# Patient Record
Sex: Male | Born: 1960
Health system: Southern US, Community
[De-identification: ages and names within clinical notes are randomized; demographics above are authoritative.]

## PROBLEM LIST (undated history)

## (undated) DIAGNOSIS — E119 Type 2 diabetes mellitus without complications: Secondary | ICD-10-CM

## (undated) DIAGNOSIS — E118 Type 2 diabetes mellitus with unspecified complications: Secondary | ICD-10-CM

## (undated) DIAGNOSIS — E785 Hyperlipidemia, unspecified: Secondary | ICD-10-CM

## (undated) HISTORY — DX: Hyperlipidemia, unspecified: E78.5

## (undated) HISTORY — DX: Type 2 diabetes mellitus with unspecified complications: E11.8

---

## 2007-10-10 ENCOUNTER — Emergency Department (HOSPITAL_COMMUNITY): Admission: EM | Admit: 2007-10-10 | Discharge: 2007-10-10 | Payer: Self-pay | Admitting: Emergency Medicine

## 2011-08-06 LAB — CBC
Hemoglobin: 14.2
MCHC: 34.1
Platelets: 351
RBC: 4.57
WBC: 5

## 2011-08-06 LAB — POCT CARDIAC MARKERS
CKMB, poc: 1.5
Myoglobin, poc: 86.7
Operator id: 4295
Troponin i, poc: <0.05

## 2011-08-06 LAB — DIFFERENTIAL
Eosinophils Absolute: 0.1 — ABNORMAL LOW
Eosinophils Relative: 2
Lymphs Abs: 2.4
Neutro Abs: 2.1
Neutrophils Relative %: 41 — ABNORMAL LOW

## 2011-08-06 LAB — BASIC METABOLIC PANEL
BUN: 9
Creatinine, Ser: 0.91
GFR calc Af Amer: >60
GFR calc non Af Amer: >60
Glucose, Bld: 109 — ABNORMAL HIGH

## 2014-08-23 ENCOUNTER — Emergency Department (HOSPITAL_COMMUNITY): Admission: EM | Admit: 2014-08-23 | Discharge: 2014-08-23 | Discharge status: Absent without leave | Payer: Self-pay

## 2014-10-06 ENCOUNTER — Emergency Department (HOSPITAL_COMMUNITY): Payer: BC Managed Care – PPO

## 2014-10-06 ENCOUNTER — Emergency Department (HOSPITAL_COMMUNITY)
Admission: EM | Admit: 2014-10-06 | Discharge: 2014-10-07 | Disposition: A | Discharge status: Home / Self Care | Payer: BC Managed Care – PPO | Attending: Emergency Medicine | Admitting: Emergency Medicine

## 2014-10-06 ENCOUNTER — Encounter (HOSPITAL_COMMUNITY): Payer: Self-pay

## 2014-10-06 DIAGNOSIS — Z79899 Other long term (current) drug therapy: Secondary | ICD-10-CM | POA: Insufficient documentation

## 2014-10-06 DIAGNOSIS — E119 Type 2 diabetes mellitus without complications: Secondary | ICD-10-CM | POA: Insufficient documentation

## 2014-10-06 DIAGNOSIS — R079 Chest pain, unspecified: Secondary | ICD-10-CM | POA: Insufficient documentation

## 2014-10-06 DIAGNOSIS — R0602 Shortness of breath: Secondary | ICD-10-CM | POA: Diagnosis not present

## 2014-10-06 DIAGNOSIS — R61 Generalized hyperhidrosis: Secondary | ICD-10-CM | POA: Insufficient documentation

## 2014-10-06 HISTORY — DX: Type 2 diabetes mellitus without complications: E11.9

## 2014-10-06 LAB — BASIC METABOLIC PANEL
ANION GAP: 16 — AB (ref 5–15)
BUN: 16 mg/dL (ref 6–23)
CALCIUM: 9.2 mg/dL (ref 8.4–10.5)
CHLORIDE: 95 meq/L — AB (ref 96–112)
CO2: 21 mEq/L (ref 19–32)
Creatinine, Ser: 0.98 mg/dL (ref 0.50–1.35)
GFR calc non Af Amer: >90 mL/min (ref >90)
Glucose, Bld: 335 mg/dL — ABNORMAL HIGH (ref 70–99)
Potassium: 3.9 mEq/L (ref 3.7–5.3)
Sodium: 132 mEq/L — ABNORMAL LOW (ref 137–147)

## 2014-10-06 LAB — CBC
HEMATOCRIT: 40.2 % (ref 39.0–52.0)
HEMOGLOBIN: 13.6 g/dL (ref 13.0–17.0)
MCH: 30.2 pg (ref 26.0–34.0)
MCHC: 33.8 g/dL (ref 30.0–36.0)
MCV: 89.1 fL (ref 78.0–100.0)
Platelets: 326 10*3/uL (ref 150–400)
RBC: 4.51 MIL/uL (ref 4.22–5.81)
RDW: 13.3 % (ref 11.5–15.5)
WBC: 5.1 10*3/uL (ref 4.0–10.5)

## 2014-10-06 LAB — I-STAT TROPONIN, ED: TROPONIN I, POC: 0 ng/mL (ref 0.00–0.08)

## 2014-10-06 MED ORDER — SODIUM CHLORIDE 0.9 % IV BOLUS (SEPSIS)
1000.0000 mL | Freq: Once | INTRAVENOUS | Status: AC
  Administered 2014-10-06: 1000 mL via INTRAVENOUS

## 2014-10-06 NOTE — ED Notes (Signed)
Pt presents with Left chest pain, dizziness, SOB, and nausea x2 days. Pt also reports posterior neck pain.

## 2014-10-06 NOTE — ED Provider Notes (Signed)
CSN: [id number]     Arrival date & time 10/06/14  2156 History   First MD Initiated Contact with Patient 10/06/14 2257     Chief Complaint  Patient presents with  . Chest Pain     (Consider location/radiation/quality/duration/timing/severity/associated sxs/prior Treatment) HPI Wesley Davila is a 53 y.o. male with past medical history of diabetes coming in with chest pain. Patient states it's left-sided and sharp and going on for several months. This is associated with shortness of breath diaphoresis, he denies emesis. Patient presents tonight because it occurred again at 8 PM. It lasted approximately 30 minutes and went away. At the time he was moving furniture and it resolved with rest. He currently is denying any chest pain.He denies any history of ACS or blood clots. He's had no recent fevers or infections.  Patient has no further complaints.  10 Systems reviewed and are negative for acute change except as noted in the HPI.     Past Medical History  Diagnosis Date  . Diabetes mellitus without complication    History reviewed. No pertinent past surgical history. History reviewed. No pertinent family history. History  Substance Use Topics  . Smoking status: Never Smoker   . Smokeless tobacco: Not on file  . Alcohol Use: No    Review of Systems    Allergies  Review of patient's allergies indicates no known allergies.  Home Medications   Prior to Admission medications   Medication Sig Start Date End Date Taking? Authorizing Provider  metFORMIN (GLUCOPHAGE) 500 MG tablet Take 500 mg by mouth 2 (two) times daily with a meal.   Yes Historical Provider, MD   BP 126/73 mmHg  Pulse 86  Temp(Src) 98.2 F (36.8 C)  Resp 19  SpO2 98% Physical Exam  Constitutional: He is oriented to person, place, and time. Vital signs are normal. He appears well-developed and well-nourished.  Non-toxic appearance. He does not appear ill. No distress.  HENT:  Head: Normocephalic and  atraumatic.  Nose: Nose normal.  Mouth/Throat: Oropharynx is clear and moist. No oropharyngeal exudate.  Eyes: Conjunctivae and EOM are normal. Pupils are equal, round, and reactive to light. No scleral icterus.  Neck: Normal range of motion. Neck supple. No tracheal deviation, no edema, no erythema and normal range of motion present. No thyroid mass and no thyromegaly present.  Cardiovascular: Normal rate, regular rhythm, S1 normal, S2 normal, normal heart sounds, intact distal pulses and normal pulses.  Exam reveals no gallop and no friction rub.   No murmur heard. Pulses:      Radial pulses are 2+ on the right side, and 2+ on the left side.       Dorsalis pedis pulses are 2+ on the right side, and 2+ on the left side.  Pulmonary/Chest: Effort normal and breath sounds normal. No respiratory distress. He has no wheezes. He has no rhonchi. He has no rales.  Abdominal: Soft. Normal appearance and bowel sounds are normal. He exhibits no distension, no ascites and no mass. There is no hepatosplenomegaly. There is no tenderness. There is no rebound, no guarding and no CVA tenderness.  Musculoskeletal: Normal range of motion. He exhibits no edema or tenderness.  Lymphadenopathy:    He has no cervical adenopathy.  Neurological: He is alert and oriented to person, place, and time. He has normal strength. No cranial nerve deficit or sensory deficit. He exhibits normal muscle tone. GCS eye subscore is 4. GCS verbal subscore is 5. GCS motor subscore is 6.  Skin: Skin is warm, dry and intact. No petechiae and no rash noted. He is not diaphoretic. No erythema. No pallor.  Psychiatric: He has a normal mood and affect. His behavior is normal. Judgment normal.  Nursing note and vitals reviewed.   ED Course  Procedures (including critical care time) Labs Review Labs Reviewed  BASIC METABOLIC PANEL - Abnormal; Notable for the following:    Sodium 132 (*)    Chloride 95 (*)    Glucose, Bld 335 (*)     Anion gap 16 (*)    All other components within normal limits  CBC  PRO B NATRIURETIC PEPTIDE  TROPONIN I  I-STAT TROPOININ, ED    Imaging Review Dg Chest 2 View  10/07/2014   CLINICAL DATA:  Chest pain, history diabetes  EXAM: CHEST  2 VIEW  COMPARISON:  10/10/2007  FINDINGS: Upper normal heart size.  Mediastinal contours and pulmonary vascularity normal.  Minimal RIGHT basilar atelectasis.  Lungs otherwise clear.  No pleural effusion or pneumothorax.  No acute osseous findings.  IMPRESSION: Minimal RIGHT basilar atelectasis without acute infiltrate.   Electronically Signed   By: Lavonia Dana M.D.   On: 10/07/2014 00:25     EKG Interpretation   Date/Time:  Wednesday October 06 2014 22:02:36 EST Ventricular Rate:  102 PR Interval:  148 QRS Duration: 84 QT Interval:  344 QTC Calculation: 448 R Axis:   31 Text Interpretation:  Sinus tachycardia with occasional Premature  ventricular complexes Abnormal ECG No significant change since last  tracing Confirmed by Glynn Octave [phone]) on 10/06/2014 11:23:44  PM      MDM   Final diagnoses:  Chest pain    Patient's this emergency department for concern for chest pain. He denies any dizziness or neck pain to meet despite the triage note. Fingerstick is also in the 300s, we'll provide IV fluids. There is no DKA. Will evaluate patient with heart score and 2 sets of troponin.  Second troponin is negative, heart score is 2. Patient is not currently having any chest pain. His vital signs remain within his normal limits and is safe for discharge.  Everlene Balls, MD 10/07/14 [phone]

## 2014-10-07 LAB — PRO B NATRIURETIC PEPTIDE: Pro B Natriuretic peptide (BNP): 15.5 pg/mL (ref 0–125)

## 2014-10-07 LAB — TROPONIN I

## 2014-10-07 NOTE — Discharge Instructions (Signed)
Chest Pain (Nonspecific) Mr. Wesley Davila, you were seen today for chest pain, your laboratory studies and EKG were normal. Follow-up with her primary care physician within 3 days for continued management. If your symptoms worsen come back to emergency department. Thank you. It is often hard to give a diagnosis for the cause of chest pain. There is always a chance that your pain could be related to something serious, such as a heart attack or a blood clot in the lungs. You need to follow up with your doctor. HOME CARE  If antibiotic medicine was given, take it as directed by your doctor. Finish the medicine even if you start to feel better.  For the next few days, avoid activities that bring on chest pain. Continue physical activities as told by your doctor.  Do not use any tobacco products. This includes cigarettes, chewing tobacco, and e-cigarettes.  Avoid drinking alcohol.  Only take medicine as told by your doctor.  Follow your doctor's suggestions for more testing if your chest pain does not go away.  Keep all doctor visits you made. GET HELP IF:  Your chest pain does not go away, even after treatment.  You have a rash with blisters on your chest.  You have a fever. GET HELP RIGHT AWAY IF:   You have more pain or pain that spreads to your arm, neck, jaw, back, or belly (abdomen).  You have shortness of breath.  You cough more than usual or cough up blood.  You have very bad back or belly pain.  You feel sick to your stomach (nauseous) or throw up (vomit).  You have very bad weakness.  You pass out (faint).  You have chills. This is an emergency. Do not wait to see if the problems will go away. Call your local emergency services (911 in [place] Do not drive yourself to the hospital. MAKE SURE YOU:   Understand these instructions.  Will watch your condition.  Will get help right away if you are not doing well or get worse. Document Released: 04/02/2008 Document  Revised: 10/20/2013 Document Reviewed: 04/02/2008 [facility] Patient Information 2015 [place], [place]. This information is not intended to replace advice given to you by your health care provider. Make sure you discuss any questions you have with your health care provider.

## 2015-01-11 ENCOUNTER — Emergency Department (HOSPITAL_COMMUNITY)
Admission: EM | Admit: 2015-01-11 | Discharge: 2015-01-12 | Disposition: A | Discharge status: Home / Self Care | Payer: BLUE CROSS/BLUE SHIELD | Attending: Emergency Medicine | Admitting: Emergency Medicine

## 2015-01-11 ENCOUNTER — Encounter (HOSPITAL_COMMUNITY): Payer: Self-pay | Admitting: Emergency Medicine

## 2015-01-11 DIAGNOSIS — R079 Chest pain, unspecified: Secondary | ICD-10-CM | POA: Insufficient documentation

## 2015-01-11 DIAGNOSIS — E119 Type 2 diabetes mellitus without complications: Secondary | ICD-10-CM | POA: Diagnosis not present

## 2015-01-11 DIAGNOSIS — Z79899 Other long term (current) drug therapy: Secondary | ICD-10-CM | POA: Diagnosis not present

## 2015-01-11 DIAGNOSIS — R0602 Shortness of breath: Secondary | ICD-10-CM | POA: Insufficient documentation

## 2015-01-11 DIAGNOSIS — I10 Essential (primary) hypertension: Secondary | ICD-10-CM | POA: Insufficient documentation

## 2015-01-11 LAB — BASIC METABOLIC PANEL
ANION GAP: 7 (ref 5–15)
BUN: 14 mg/dL (ref 6–23)
CHLORIDE: 101 mmol/L (ref 96–112)
CO2: 27 mmol/L (ref 19–32)
CREATININE: 0.9 mg/dL (ref 0.50–1.35)
Calcium: 8.9 mg/dL (ref 8.4–10.5)
GFR calc non Af Amer: >90 mL/min (ref >90)
Glucose, Bld: 322 mg/dL — ABNORMAL HIGH (ref 70–99)
POTASSIUM: 3.9 mmol/L (ref 3.5–5.1)
SODIUM: 135 mmol/L (ref 135–145)

## 2015-01-11 LAB — CBC
HCT: 41.4 % (ref 39.0–52.0)
HEMOGLOBIN: 13.7 g/dL (ref 13.0–17.0)
MCH: 29.9 pg (ref 26.0–34.0)
MCHC: 33.1 g/dL (ref 30.0–36.0)
MCV: 90.4 fL (ref 78.0–100.0)
Platelets: 332 10*3/uL (ref 150–400)
RBC: 4.58 MIL/uL (ref 4.22–5.81)
RDW: 13.1 % (ref 11.5–15.5)
WBC: 5.7 10*3/uL (ref 4.0–10.5)

## 2015-01-11 LAB — I-STAT TROPONIN, ED: TROPONIN I, POC: 0 ng/mL (ref 0.00–0.08)

## 2015-01-11 NOTE — ED Notes (Signed)
Pt states has left sided chest pain started at 5 pm, has had neck and head pain for 3 days with tightness in neck-- ws sleeping this afternoon and woke up with chest pain.

## 2015-01-12 ENCOUNTER — Emergency Department (HOSPITAL_COMMUNITY): Payer: BLUE CROSS/BLUE SHIELD

## 2015-01-12 LAB — I-STAT TROPONIN, ED: Troponin i, poc: 0 ng/mL (ref 0.00–0.08)

## 2015-01-12 MED ORDER — IBUPROFEN 800 MG PO TABS
800.0000 mg | ORAL_TABLET | Freq: Once | ORAL | Status: AC
  Administered 2015-01-12: 800 mg via ORAL
  Filled 2015-01-12: qty 1

## 2015-01-12 MED ORDER — NITROGLYCERIN 0.4 MG SL SUBL
0.4000 mg | SUBLINGUAL_TABLET | SUBLINGUAL | Status: DC | PRN
  Administered 2015-01-12: 0.4 mg via SUBLINGUAL
  Filled 2015-01-12: qty 1

## 2015-01-12 NOTE — ED Provider Notes (Signed)
CSN: [id number]     Arrival date & time 01/11/15  2133 History  This chart was scribed for Everlene Balls, MD by Delphia Grates, ED Scribe. This patient was seen in room D35C/D35C and the patient's care was started at 12:04 AM.    Chief Complaint  Patient presents with  . Chest Pain    The history is provided by the patient. No language interpreter was used.     HPI Comments: Wesley Davila is a 54 y.o. male, with history of DM and HTN, who presents to the Emergency Department complaining of intermittent left sided chest pain described as pressure that began approximately 7 hours ago. Patient states he was at home, resting when the pain started and denies any physical exertion prior to onset. There is associated SOB. He reports history of the same 3-4 years ago. He also complains of generalized head and neck/throat pain for the past 3 days. Patient notes the pain is worse with any movement of the head. He suspects the pain is related to way he was sleeping or the pillows he was using, however, he reports the pain still persist after correcting the issue. He denies recent surgery or travel. He further denies diaphoresis, nausea, vomiting. Patient is not established with a cardiologist.  PCP: Barbette Merino, MD   Past Medical History  Diagnosis Date  . Diabetes mellitus without complication    History reviewed. No pertinent past surgical history. No family history on file. History  Substance Use Topics  . Smoking status: Never Smoker   . Smokeless tobacco: Not on file  . Alcohol Use: No    Review of Systems  A complete 10 system review of systems was obtained and all systems are negative except as noted in the HPI and PMH.    Allergies  Review of patient's allergies indicates no known allergies.  Home Medications   Prior to Admission medications   Medication Sig Start Date End Date Taking? Authorizing Provider  ibuprofen (ADVIL,MOTRIN) 200 MG tablet Take 200 mg by mouth every 6  (six) hours as needed for mild pain.   Yes Historical Provider, MD  metFORMIN (GLUCOPHAGE) 500 MG tablet Take 500 mg by mouth 2 (two) times daily with a meal.   Yes Historical Provider, MD   BP 133/88 mmHg  Pulse 86  Temp(Src) 98.3 F (36.8 C) (Oral)  Resp 20  Ht 5\' 9"  (1.753 m)  Wt 195 lb (88.451 kg)  BMI 28.78 kg/m2  SpO2 100%  Physical Exam  Constitutional: He is oriented to person, place, and time. Vital signs are normal. He appears well-developed and well-nourished.  Non-toxic appearance. He does not appear ill. No distress.  HENT:  Head: Normocephalic and atraumatic.  Nose: Nose normal.  Mouth/Throat: Oropharynx is clear and moist. No oropharyngeal exudate.  Eyes: Conjunctivae and EOM are normal. Pupils are equal, round, and reactive to light. No scleral icterus.  Neck: Normal range of motion. Neck supple. No tracheal deviation, no edema, no erythema and normal range of motion present. No thyroid mass and no thyromegaly present.  Cardiovascular: Normal rate, regular rhythm, S1 normal, S2 normal, normal heart sounds, intact distal pulses and normal pulses.  Exam reveals no gallop and no friction rub.   No murmur heard. Pulses:      Radial pulses are 2+ on the right side, and 2+ on the left side.       Dorsalis pedis pulses are 2+ on the right side, and 2+ on the left side.  Pulmonary/Chest:  Effort normal and breath sounds normal. No respiratory distress. He has no wheezes. He has no rhonchi. He has no rales.  Abdominal: Soft. Normal appearance and bowel sounds are normal. He exhibits no distension, no ascites and no mass. There is no hepatosplenomegaly. There is no tenderness. There is no rebound, no guarding and no CVA tenderness.  Musculoskeletal: Normal range of motion. He exhibits no edema or tenderness.  Lymphadenopathy:    He has no cervical adenopathy.  Neurological: He is alert and oriented to person, place, and time. He has normal strength. No cranial nerve deficit or  sensory deficit.  Skin: Skin is warm, dry and intact. No petechiae and no rash noted. He is not diaphoretic. No erythema. No pallor.  Psychiatric: He has a normal mood and affect. His behavior is normal. Judgment normal.  Nursing note and vitals reviewed.   ED Course  Procedures (including critical care time)  DIAGNOSTIC STUDIES: Oxygen Saturation is 100% on room air, normal by my interpretation.    COORDINATION OF CARE: At 0013 Discussed treatment plan with patient which includes labs. Patient agrees.   Labs Review Labs Reviewed  BASIC METABOLIC PANEL - Abnormal; Notable for the following:    Glucose, Bld 322 (*)    All other components within normal limits  CBC  I-STAT TROPOININ, ED  Randolm Idol, ED    Imaging Review Dg Chest 2 View  01/12/2015   CLINICAL DATA:  Left-sided chest pain and dyspnea  EXAM: CHEST  2 VIEW  COMPARISON:  10/06/2014  FINDINGS: There is mild crowding of the basilar markings due to shallow inspiration. The lungs are clear. There is no effusion. Hilar, mediastinal and cardiac contours are unremarkable and unchanged.  IMPRESSION: No active cardiopulmonary disease.   Electronically Signed   By: Andreas Newport M.D.   On: 01/12/2015 01:35     EKG Interpretation   Date/Time:  Tuesday January 11 2015 21:41:01 EDT Ventricular Rate:  95 PR Interval:  150 QRS Duration: 84 QT Interval:  360 QTC Calculation: 452 R Axis:   22 Text Interpretation:  Normal sinus rhythm Nonspecific T wave abnormality  Abnormal ECG No significant change since last tracing Confirmed by Glynn Octave [phone]) on 01/12/2015 12:02:19 AM      MDM   Final diagnoses:  None   patient presents to the emergency department for chest pain. He is low risk for ACS due to risk factors and his history. Will evaluate with 2 sets of troponins and the heart score in the emergency department. Heart score is 3, troponins negative 2. His repeat EKG is unchanged and does not show any  signs of ischemia. At this time the patient is stable, his vital signs were within his normal limits, he is safe for discharge with PCP fu within 3 days.    I personally performed the services described in this documentation, which was scribed in my presence. The recorded information has been reviewed and is accurate.   Everlene Balls, MD 01/12/15 [phone]

## 2015-01-12 NOTE — Discharge Instructions (Signed)
Chest Pain (Nonspecific) Wesley Davila, follow up with your primary physician within 3 days for continued evaluation of your chest pain.  If symptoms worsen, come back to the ED immediately. Thank you It is often hard to give a diagnosis for the cause of chest pain. There is always a chance that your pain could be related to something serious, such as a heart attack or a blood clot in the lungs. You need to follow up with your doctor. HOME CARE  If antibiotic medicine was given, take it as directed by your doctor. Finish the medicine even if you start to feel better.  For the next few days, avoid activities that bring on chest pain. Continue physical activities as told by your doctor.  Do not use any tobacco products. This includes cigarettes, chewing tobacco, and e-cigarettes.  Avoid drinking alcohol.  Only take medicine as told by your doctor.  Follow your doctor's suggestions for more testing if your chest pain does not go away.  Keep all doctor visits you made. GET HELP IF:  Your chest pain does not go away, even after treatment.  You have a rash with blisters on your chest.  You have a fever. GET HELP RIGHT AWAY IF:   You have more pain or pain that spreads to your arm, neck, jaw, back, or belly (abdomen).  You have shortness of breath.  You cough more than usual or cough up blood.  You have very bad back or belly pain.  You feel sick to your stomach (nauseous) or throw up (vomit).  You have very bad weakness.  You pass out (faint).  You have chills. This is an emergency. Do not wait to see if the problems will go away. Call your local emergency services (911 in [place] Do not drive yourself to the hospital. MAKE SURE YOU:   Understand these instructions.  Will watch your condition.  Will get help right away if you are not doing well or get worse. Document Released: 04/02/2008 Document Revised: 10/20/2013 Document Reviewed: 04/02/2008 [facility] Patient  Information 2015 [place], [place]. This information is not intended to replace advice given to you by your health care provider. Make sure you discuss any questions you have with your health care provider.

## 2015-10-11 ENCOUNTER — Encounter: Payer: Self-pay | Admitting: Physician Assistant

## 2015-10-11 ENCOUNTER — Ambulatory Visit (INDEPENDENT_AMBULATORY_CARE_PROVIDER_SITE_OTHER): Payer: BLUE CROSS/BLUE SHIELD | Admitting: Physician Assistant

## 2015-10-11 VITALS — BP 129/84 | HR 93 | Temp 98.2°F | Resp 16 | Ht 68.5 in | Wt 194.0 lb

## 2015-10-11 DIAGNOSIS — N529 Male erectile dysfunction, unspecified: Secondary | ICD-10-CM | POA: Diagnosis not present

## 2015-10-11 DIAGNOSIS — Z23 Encounter for immunization: Secondary | ICD-10-CM

## 2015-10-11 DIAGNOSIS — Z125 Encounter for screening for malignant neoplasm of prostate: Secondary | ICD-10-CM

## 2015-10-11 DIAGNOSIS — E785 Hyperlipidemia, unspecified: Secondary | ICD-10-CM | POA: Diagnosis not present

## 2015-10-11 DIAGNOSIS — R7309 Other abnormal glucose: Secondary | ICD-10-CM | POA: Diagnosis not present

## 2015-10-11 DIAGNOSIS — Z0000 Encounter for general adult medical examination without abnormal findings: Secondary | ICD-10-CM

## 2015-10-11 DIAGNOSIS — Z8349 Family history of other endocrine, nutritional and metabolic diseases: Secondary | ICD-10-CM

## 2015-10-11 DIAGNOSIS — Z1211 Encounter for screening for malignant neoplasm of colon: Secondary | ICD-10-CM

## 2015-10-11 LAB — COMPREHENSIVE METABOLIC PANEL
ALBUMIN: 3.9 g/dL (ref 3.6–5.1)
ALT: 16 U/L (ref 9–46)
AST: 14 U/L (ref 10–35)
Alkaline Phosphatase: 48 U/L (ref 40–115)
BILIRUBIN TOTAL: 0.3 mg/dL (ref 0.2–1.2)
BUN: 14 mg/dL (ref 7–25)
CO2: 24 mmol/L (ref 20–31)
CREATININE: 0.86 mg/dL (ref 0.70–1.25)
Calcium: 9 mg/dL (ref 8.6–10.3)
Chloride: 98 mmol/L (ref 98–110)
Glucose, Bld: 322 mg/dL — ABNORMAL HIGH (ref 65–99)
Potassium: 4.3 mmol/L (ref 3.5–5.3)
SODIUM: 134 mmol/L — AB (ref 135–146)
TOTAL PROTEIN: 6.7 g/dL (ref 6.1–8.1)

## 2015-10-11 LAB — CBC
HCT: 42.8 % (ref 39.0–52.0)
HEMOGLOBIN: 14.1 g/dL (ref 13.0–17.0)
MCH: 29.5 pg (ref 26.0–34.0)
MCHC: 32.9 g/dL (ref 30.0–36.0)
MCV: 89.5 fL (ref 78.0–100.0)
MPV: 10.2 fL (ref 8.6–12.4)
Platelets: 344 10*3/uL (ref 150–400)
RBC: 4.78 MIL/uL (ref 4.22–5.81)
RDW: 13.5 % (ref 11.5–15.5)
WBC: 4.4 10*3/uL (ref 4.0–10.5)

## 2015-10-11 LAB — LIPID PANEL
Cholesterol: 267 mg/dL — ABNORMAL HIGH (ref 125–200)
HDL: 45 mg/dL (ref >=40)
LDL CALC: 160 mg/dL — AB (ref <130)
TRIGLYCERIDES: 311 mg/dL — AB (ref <150)
Total CHOL/HDL Ratio: 5.9 Ratio — ABNORMAL HIGH (ref <=5.0)
VLDL: 62 mg/dL — ABNORMAL HIGH (ref <30)

## 2015-10-11 LAB — TSH: TSH: 2.011 u[IU]/mL (ref 0.350–4.500)

## 2015-10-11 MED ORDER — SILDENAFIL CITRATE 100 MG PO TABS: 50.0000 mg | ORAL_TABLET | Freq: Every day | ORAL | Status: DC | PRN

## 2015-10-11 NOTE — Progress Notes (Signed)
[facility] [address], [place] [address] [phone]  Date:  10/11/2015   Name:  Wesley Davila   DOB:  08/01/1951   MRN:  [id number]  PCP:  No primary care provider on file.    Chief Complaint: Annual Exam   History of Present Illness:  This is a 54 y.o. male who is presenting for CPE. He had last CPE 1 year ago at another facility. He was told at that time that he had high cholesterol and high sugar. He states he was not told he had diabetes. He was started on something for cholesterol but states he stopped after a few months "for no reason".  Immunizations: declines flu shot, needs tdap, never pneumonia or prevnar Dentist: regular visits Eye: no vision change. Doesn't wear glasses or contacts. Diet: eats healthy and drinks a lot of water Exercise: walks 1/2 mile every night Fam hx: sister breast cancer age 58, mother with thyroid cancer age 49. No hx colon cancer. Dad with MI and CVA age 43. He is unsure if father had prostate cancer but does know he had prostate surgery. No hx DM. Sexual hx: with wife, problems with ED. No cp or sob with exercise. Urinary hesitancy/frequency or nocturia: gets up 1x at night. Never had prostate exam Tobacco/alcohol/substance use: no/no/no Colonoscopy: never Works as Gaffer.  Lives with wife and kids age 17, 45 and 27  Review of Systems:  Review of Systems See CMA note  There are no active problems to display for this patient.   Prior to Admission medications   Not on File    No Known Allergies  History reviewed. No pertinent past surgical history.  Social History  Substance Use Topics  . Smoking status: Never Smoker   . Smokeless tobacco: None  . Alcohol Use: No    Family History  Problem Relation Age of Onset  . Heart disease Father     Medication list has been reviewed and updated.  Physical Examination:  Physical Exam  Constitutional: He is oriented to person, place, and time.  He appears well-developed and well-nourished.  HENT:  Head: Normocephalic and atraumatic.  Right Ear: Hearing, tympanic membrane, external ear and ear canal normal.  Left Ear: Hearing, tympanic membrane, external ear and ear canal normal.  Nose: Nose normal.  Mouth/Throat: Uvula is midline, oropharynx is clear and moist and mucous membranes are normal.  Eyes: Conjunctivae, EOM and lids are normal. Right eye exhibits no discharge. Left eye exhibits no discharge. No scleral icterus.  Neck: Trachea normal and normal range of motion. Neck supple. Carotid bruit is not present. No thyromegaly present.  Cardiovascular: Normal rate, regular rhythm, normal heart sounds, intact distal pulses and normal pulses.   No murmur heard. Pulmonary/Chest: Effort normal and breath sounds normal. No respiratory distress. He has no wheezes. He has no rhonchi. He has no rales.  Abdominal: Soft. Normal appearance and bowel sounds are normal. He exhibits no abdominal bruit. There is no tenderness.  Genitourinary: Rectum normal and prostate normal.  Musculoskeletal: Normal range of motion. He exhibits no edema or tenderness.  Lymphadenopathy:       Head (right side): No submental, no submandibular, no tonsillar, no preauricular, no posterior auricular and no occipital adenopathy present.       Head (left side): No submental, no submandibular, no tonsillar, no preauricular, no posterior auricular and no occipital adenopathy present.    He has no cervical adenopathy.  Neurological: He is alert  and oriented to person, place, and time. No cranial nerve deficit. Coordination and gait normal.  Skin: Skin is warm, dry and intact. No lesion and no rash noted.  Psychiatric: He has a normal mood and affect. His speech is normal and behavior is normal. Judgment and thought content normal.   BP 129/84 mmHg  Pulse 93  Temp(Src) 98.2 F (36.8 C)  Resp 16  Ht 5' 8.5" (1.74 m)  Wt 194 lb (87.998 kg)  BMI 29.07  kg/m2  Assessment and Plan:  1. Annual physical exam Up to date on preventative medicine.  Referred for colonoscopy. Labs below pending - CBC  2. Hyperlipidemia Will determine if statin still needed. - Lipid panel  3. Elevated glucose - Comprehensive metabolic panel  4. Erectile dysfunction, unspecified erectile dysfunction type Pt with ED. Will try viagra 50-100 mg as needed. - sildenafil (VIAGRA) 100 MG tablet; Take 0.5-1 tablets (50-100 mg total) by mouth daily as needed for erectile dysfunction.  Dispense: 5 tablet; Refill: 11  5. Family history of thyroid disease - TSH  6. Need for Tdap vaccination - Tdap vaccine greater than or equal to 7yo IM  7. Need for vaccination with 13-polyvalent pneumococcal conjugate vaccine - Pneumococcal conjugate vaccine 13-valent IM  8. Prostate cancer screening DRE normal. - PSA  9. Colon cancer screening - Ambulatory referral to Gastroenterology   Benjaman Pott. Drenda Freeze, MHS [facility]  10/11/2015

## 2015-10-11 NOTE — Patient Instructions (Signed)
Take viagra 50 mg 30 minutes before sexual intercourse. May increase to 100 mg if needed. I will call you with your lab results. You will get a phone call to make appointment with GI for colonoscopy. Return with further problems.

## 2015-10-11 NOTE — Progress Notes (Signed)
   Subjective:    Patient ID: Wesley Davila, male    DOB: 08/01/1951, 54 y.o.   MRN: [id number]  HPI    Review of Systems  Constitutional: Negative.   HENT: Negative.   Eyes: Negative.   Respiratory: Negative.   Cardiovascular: Negative.   Gastrointestinal: Negative.   Endocrine: Negative.   Genitourinary: Negative.   Musculoskeletal: Positive for arthralgias. Negative for myalgias, back pain, joint swelling, gait problem, neck pain and neck stiffness.  Skin: Negative.   Allergic/Immunologic: Negative.   Neurological: Negative.   Hematological: Negative.   Psychiatric/Behavioral: Negative.        Objective:   Physical Exam        Assessment & Plan:

## 2015-10-12 ENCOUNTER — Encounter (HOSPITAL_COMMUNITY): Payer: Self-pay | Admitting: Emergency Medicine

## 2015-10-12 LAB — PSA: PSA: 0.46 ng/mL (ref <=4.00)

## 2015-10-13 ENCOUNTER — Encounter: Payer: Self-pay | Admitting: Gastroenterology

## 2015-11-04 ENCOUNTER — Telehealth: Payer: Self-pay | Admitting: Physician Assistant

## 2015-11-04 DIAGNOSIS — E785 Hyperlipidemia, unspecified: Secondary | ICD-10-CM

## 2015-11-04 DIAGNOSIS — E119 Type 2 diabetes mellitus without complications: Secondary | ICD-10-CM

## 2015-11-04 MED ORDER — ATORVASTATIN CALCIUM 20 MG PO TABS: 20.0000 mg | ORAL_TABLET | Freq: Every day | ORAL | Status: DC

## 2015-11-04 MED ORDER — GLUCOSE BLOOD VI STRP: ORAL_STRIP | Status: DC

## 2015-11-04 MED ORDER — METFORMIN HCL 500 MG PO TABS: 500.0000 mg | ORAL_TABLET | Freq: Two times a day (BID) | ORAL | Status: DC

## 2015-11-04 NOTE — Telephone Encounter (Signed)
Started pt on lipitor 20 mg and metformin 500 mg BID. Sent glucometer and test strips. Return in 2 months for follow up. He states he plans to come next week for his shoulder pain. Will get baseline A1C at that time.

## 2015-11-30 ENCOUNTER — Encounter: Payer: Self-pay | Admitting: Gastroenterology

## 2015-12-12 ENCOUNTER — Encounter: Payer: BLUE CROSS/BLUE SHIELD | Admitting: Gastroenterology

## 2015-12-13 IMAGING — CR DG CHEST 2V
2 series · 2 of 2 positions shown · non-contrast
Comparison: 10/10/2007

CLINICAL DATA: Chest pain, history diabetes

EXAM:
CHEST  2 VIEW

[chest pa]
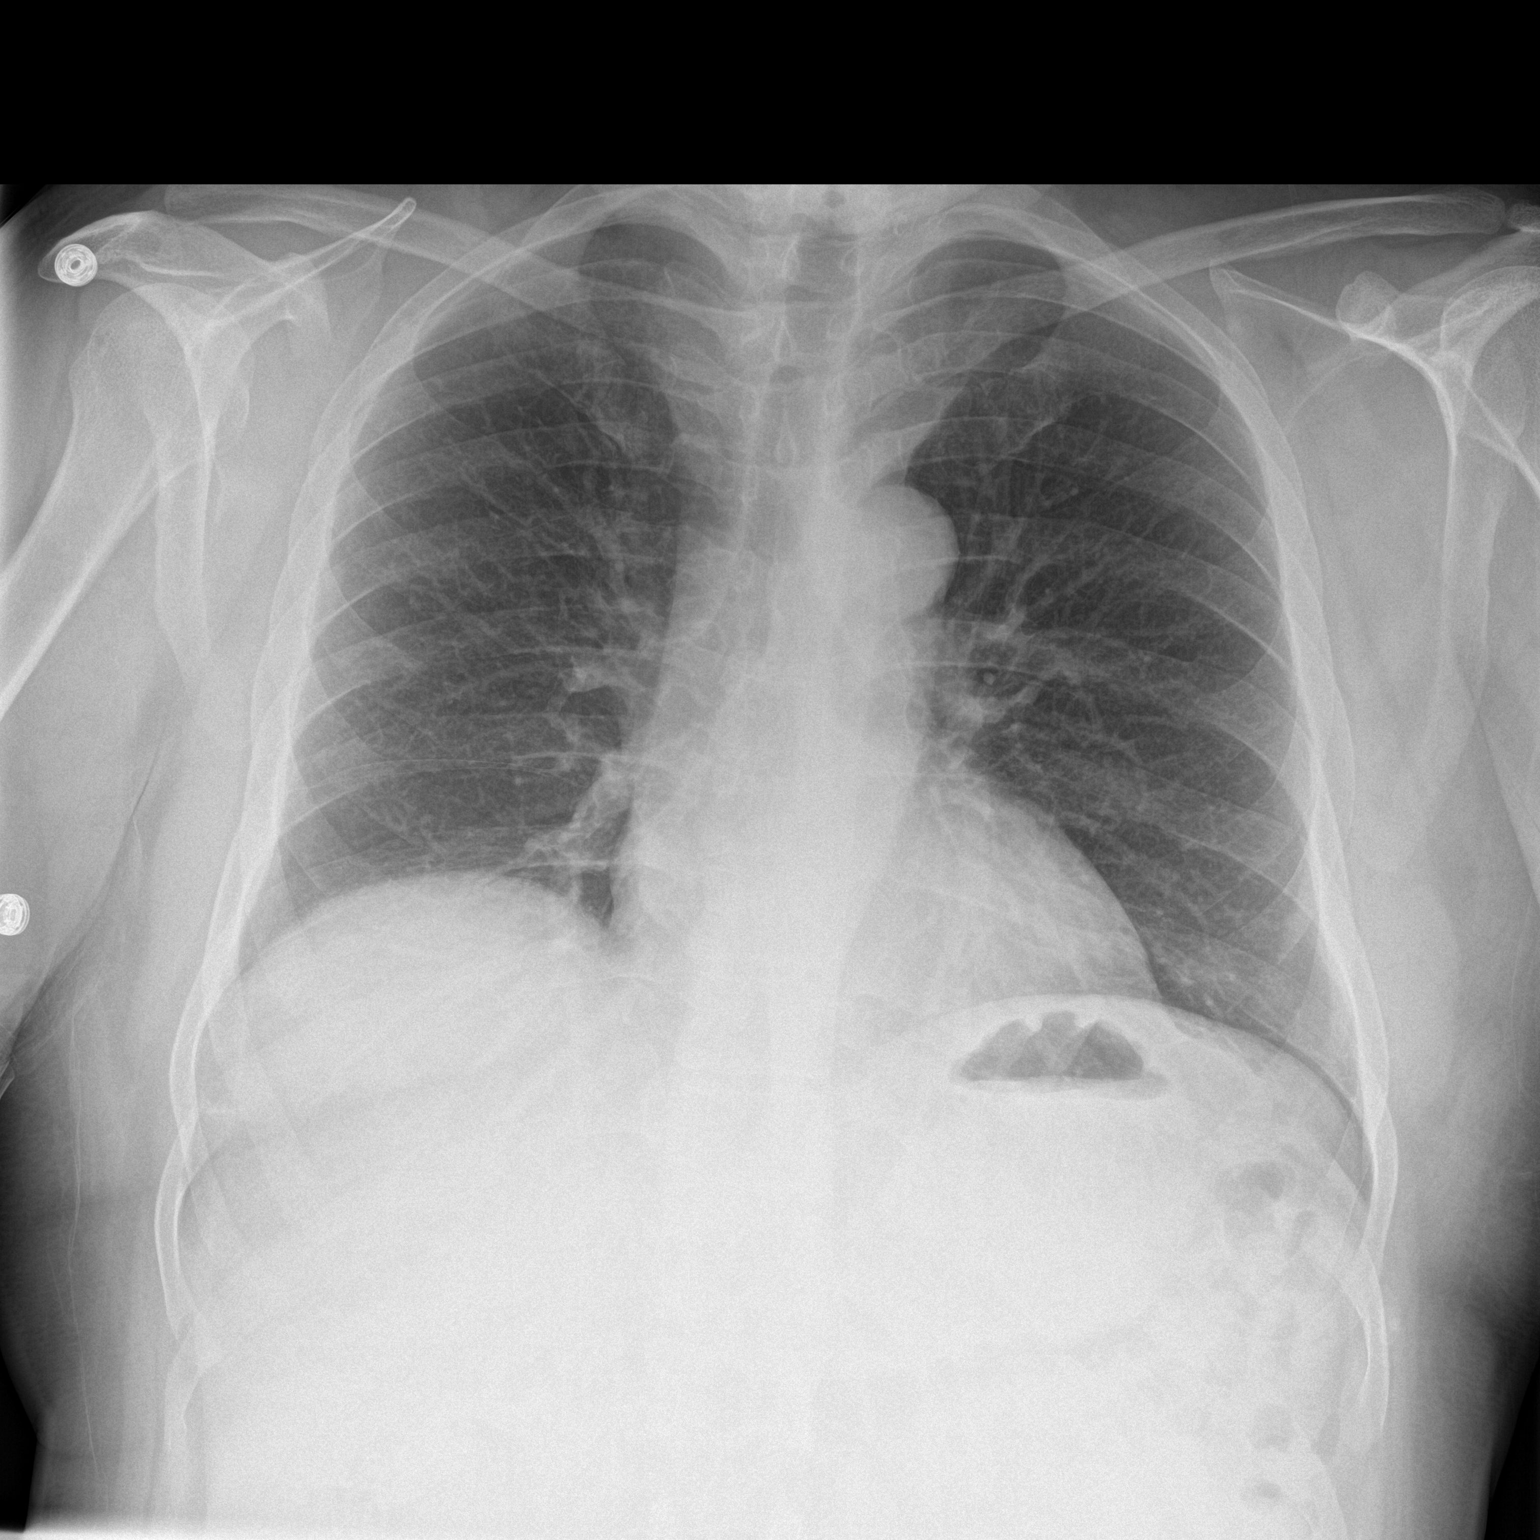

[chest lat]
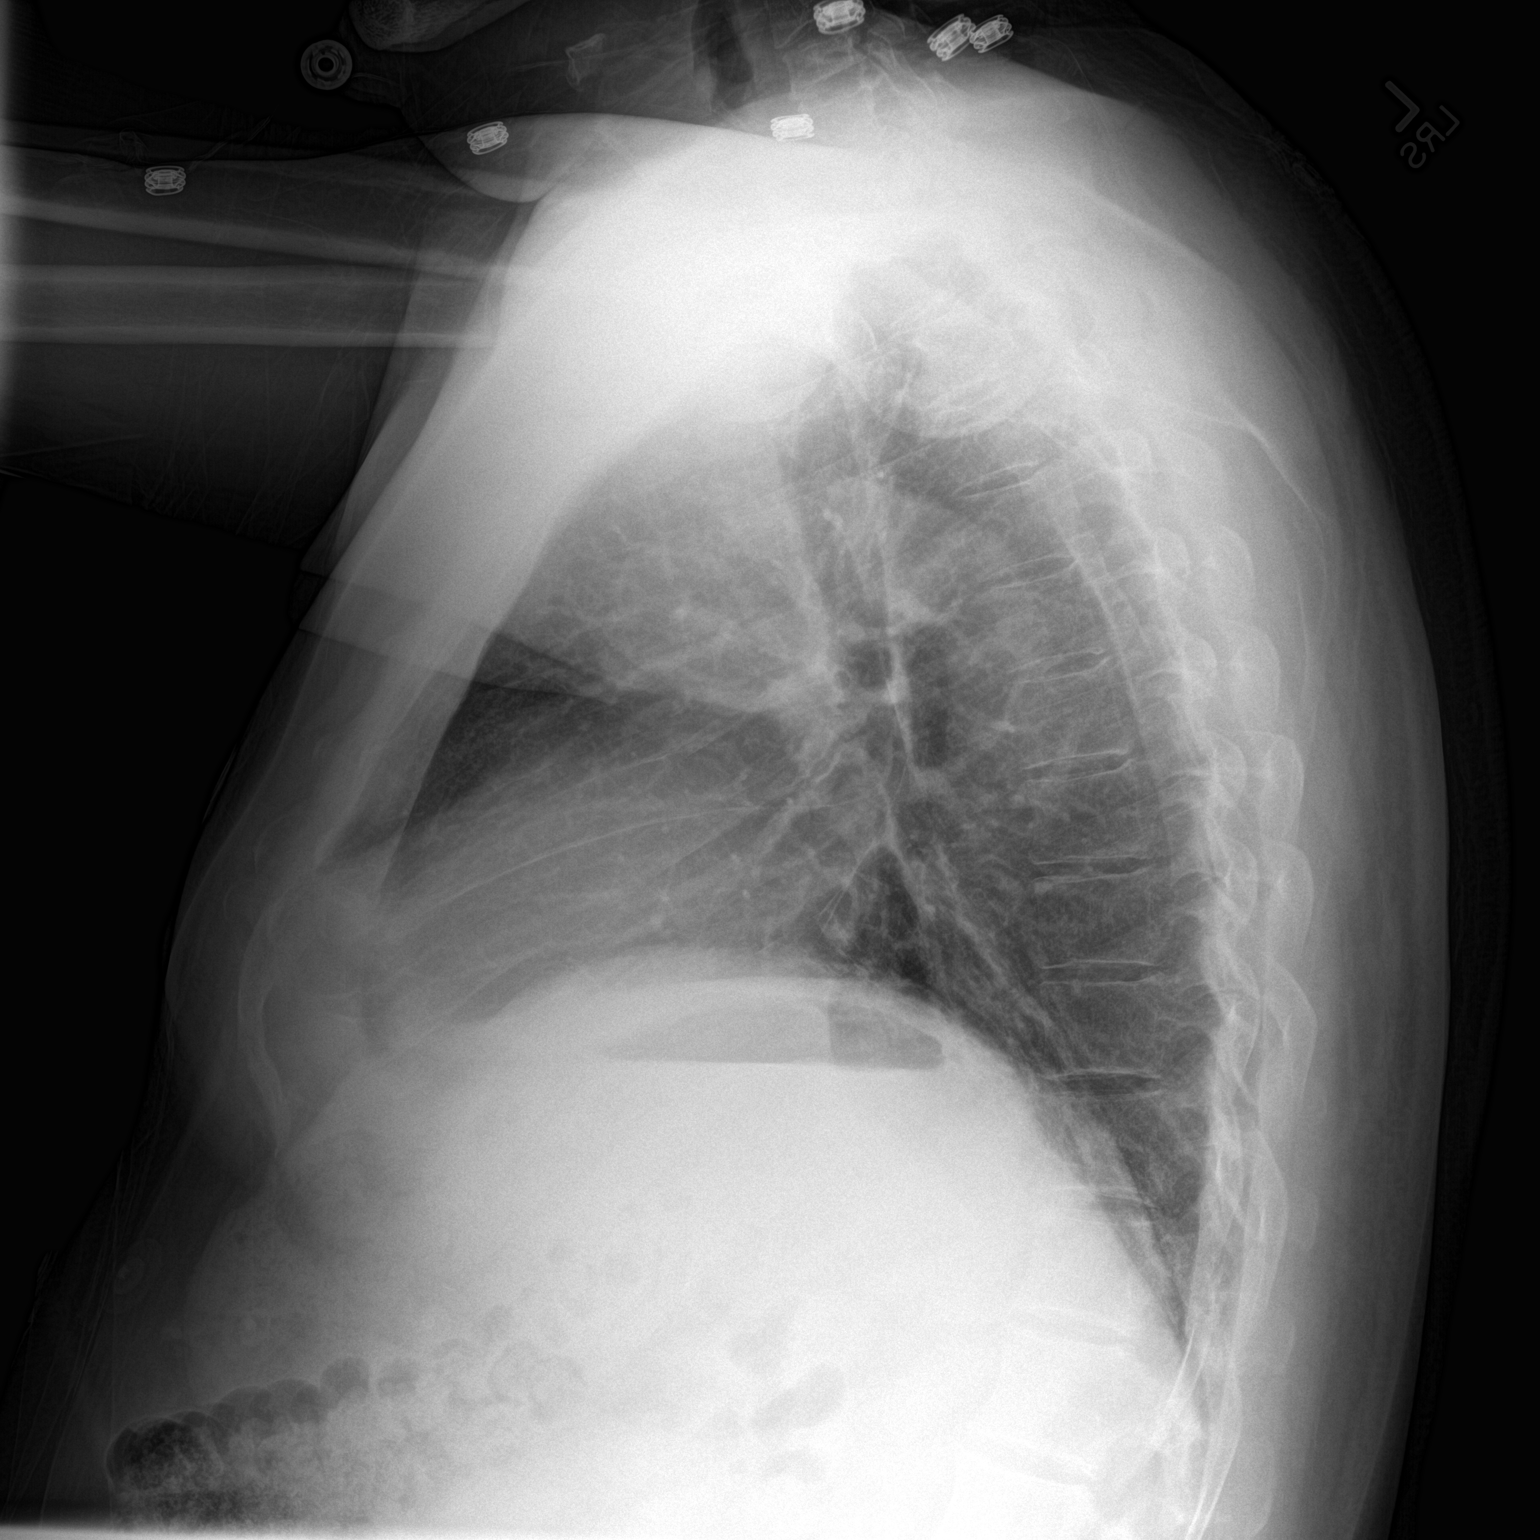

[2 of 2 positions shown; findings below may reference images not displayed]

FINDINGS: Upper normal heart size.

Mediastinal contours and pulmonary vascularity normal.

Minimal RIGHT basilar atelectasis.

Lungs otherwise clear.

No pleural effusion or pneumothorax.

No acute osseous findings.
IMPRESSION: Minimal RIGHT basilar atelectasis without acute infiltrate.

## 2015-12-29 ENCOUNTER — Ambulatory Visit (AMBULATORY_SURGERY_CENTER): Payer: Self-pay | Admitting: *Deleted

## 2015-12-29 VITALS — Ht 69.0 in | Wt 198.0 lb

## 2015-12-29 DIAGNOSIS — Z1211 Encounter for screening for malignant neoplasm of colon: Secondary | ICD-10-CM

## 2015-12-29 MED ORDER — NA SULFATE-K SULFATE-MG SULF 17.5-3.13-1.6 GM/177ML PO SOLN: 1.0000 | Freq: Once | ORAL | Status: DC

## 2015-12-29 NOTE — Progress Notes (Signed)
No egg or soy allergy. Never been sedated.  No diet meds.   No home O2.  Emmi given.

## 2016-01-12 ENCOUNTER — Encounter: Payer: Self-pay | Admitting: Gastroenterology

## 2016-01-12 ENCOUNTER — Ambulatory Visit (AMBULATORY_SURGERY_CENTER): Payer: BLUE CROSS/BLUE SHIELD | Admitting: Gastroenterology

## 2016-01-12 VITALS — BP 150/86 | HR 76 | Temp 98.2°F | Resp 13 | Ht 69.0 in | Wt 198.0 lb

## 2016-01-12 DIAGNOSIS — D122 Benign neoplasm of ascending colon: Secondary | ICD-10-CM | POA: Diagnosis not present

## 2016-01-12 DIAGNOSIS — D120 Benign neoplasm of cecum: Secondary | ICD-10-CM

## 2016-01-12 DIAGNOSIS — K635 Polyp of colon: Secondary | ICD-10-CM

## 2016-01-12 DIAGNOSIS — D123 Benign neoplasm of transverse colon: Secondary | ICD-10-CM

## 2016-01-12 DIAGNOSIS — Z1211 Encounter for screening for malignant neoplasm of colon: Secondary | ICD-10-CM | POA: Diagnosis not present

## 2016-01-12 DIAGNOSIS — D127 Benign neoplasm of rectosigmoid junction: Secondary | ICD-10-CM

## 2016-01-12 LAB — GLUCOSE, CAPILLARY
Glucose-Capillary: 190 mg/dL — ABNORMAL HIGH (ref 65–99)
Glucose-Capillary: 176 mg/dL — ABNORMAL HIGH (ref 65–99)

## 2016-01-12 MED ORDER — SODIUM CHLORIDE 0.9 % IV SOLN: 500.0000 mL | INTRAVENOUS | Status: DC

## 2016-01-12 NOTE — Patient Instructions (Signed)
YOU HAD AN ENDOSCOPIC PROCEDURE TODAY AT [facility]:   Refer to the procedure report that was given to you for any specific questions about what was found during the examination.  If the procedure report does not answer your questions, please call your gastroenterologist to clarify.  If you requested that your care partner not be given the details of your procedure findings, then the procedure report has been included in a sealed envelope for you to review at your convenience later.  YOU SHOULD EXPECT: Some feelings of bloating in the abdomen. Passage of more gas than usual.  Walking can help get rid of the air that was put into your GI tract during the procedure and reduce the bloating. If you had a lower endoscopy (such as a colonoscopy or flexible sigmoidoscopy) you may notice spotting of blood in your stool or on the toilet paper. If you underwent a bowel prep for your procedure, you may not have a normal bowel movement for a few days.  Please Note:  You might notice some irritation and congestion in your nose or some drainage.  This is from the oxygen used during your procedure.  There is no need for concern and it should clear up in a day or so.  SYMPTOMS TO REPORT IMMEDIATELY:   Following lower endoscopy (colonoscopy or flexible sigmoidoscopy):  Excessive amounts of blood in the stool  Significant tenderness or worsening of abdominal pains  Swelling of the abdomen that is new, acute  Fever of 100F or higher    For urgent or emergent issues, a gastroenterologist can be reached at any hour by calling [phone].   DIET: Your first meal following the procedure should be a small meal and then it is ok to progress to your normal diet. Heavy or fried foods are harder to digest and may make you feel nauseous or bloated.  Likewise, meals heavy in dairy and vegetables can increase bloating.  Drink plenty of fluids but you should avoid alcoholic beverages for 24  hours.  ACTIVITY:  You should plan to take it easy for the rest of today and you should NOT DRIVE or use heavy machinery until tomorrow (because of the sedation medicines used during the test).    FOLLOW UP: Our staff will call the number listed on your records the next business day following your procedure to check on you and address any questions or concerns that you may have regarding the information given to you following your procedure. If we do not reach you, we will leave a message.  However, if you are feeling well and you are not experiencing any problems, there is no need to return our call.  We will assume that you have returned to your regular daily activities without incident.  If any biopsies were taken you will be contacted by phone or by letter within the next 1-3 weeks.  Please call us at [phone] if you have not heard about the biopsies in 3 weeks.    SIGNATURES/CONFIDENTIALITY: You and/or your care partner have signed paperwork which will be entered into your electronic medical record.  These signatures attest to the fact that that the information above on your After Visit Summary has been reviewed and is understood.  Full responsibility of the confidentiality of this discharge information lies with you and/or your care-partner.  Polyp handout given Await pathology results Resume medication and diet

## 2016-01-12 NOTE — Op Note (Signed)
[facility] Patient Name: Wesley Davila Procedure Date: 01/12/2016 2:41 PM MRN: [id number] Endoscopist: Remo Lipps P. Havery Moros , MD Age: 55 Referring MD:  Date of Birth: 16-Jul-1961 Gender: Male Procedure:                Colonoscopy Indications:              Screening for colorectal malignant neoplasm Medicines:                Monitored Anesthesia Care Procedure:                Pre-Anesthesia Assessment:                           - Prior to the procedure, a History and Physical                            was performed, and patient medications and                            allergies were reviewed. The patient's tolerance of                            previous anesthesia was also reviewed. The risks                            and benefits of the procedure and the sedation                            options and risks were discussed with the patient.                            All questions were answered, and informed consent                            was obtained. Prior Anticoagulants: The patient has                            taken no previous anticoagulant or antiplatelet                            agents. ASA Grade Assessment: II - A patient with                            mild systemic disease. After reviewing the risks                            and benefits, the patient was deemed in                            satisfactory condition to undergo the procedure.                           After obtaining informed consent, the colonoscope  was passed under direct vision. Throughout the                            procedure, the patient's blood pressure, pulse, and                            oxygen saturations were monitored continuously. The                            Model CF-HQ190L [id number]) scope was introduced                            through the anus and advanced to the the cecum,                            identified by appendiceal orifice  and ileocecal                            valve. The colonoscopy was performed without                            difficulty. The patient tolerated the procedure                            well. The quality of the bowel preparation was                            adequate. The ileocecal valve, appendiceal orifice,                            and rectum were photographed. Scope In: 2:49:54 PM Scope Out: 3:08:00 PM Scope Withdrawal Time: 0 hours 16 minutes 16 seconds  Total Procedure Duration: 0 hours 18 minutes 6 seconds  Findings:      The perianal and digital rectal examinations were normal.      A 3 mm polyp was found in the cecum. The polyp was sessile. The polyp       was removed with a cold snare. Resection and retrieval were complete.      Two sessile polyps were found in the ascending colon. The polyps were 3       to 4 mm in size. These polyps were removed with a cold snare. Resection       and retrieval were complete.      A 4 mm polyp was found in the transverse colon. The polyp was sessile.       The polyp was removed with a cold snare. Resection and retrieval were       complete.      A 5 mm polyp was found in the recto-sigmoid colon. The polyp was       sessile. The polyp was removed with a cold snare. Resection and       retrieval were complete.      Scattered medium-mouthed diverticula were found in the left colon.      Non-bleeding internal hemorrhoids were found during retroflexion. The       hemorrhoids were small.      The exam  was otherwise without abnormality. Complications:            No immediate complications. Estimated blood loss:                            Minimal. Estimated Blood Loss:     Estimated blood loss was minimal. Impression:               - One 3 mm polyp in the cecum, removed with a cold                            snare. Resected and retrieved.                           - Two 3 to 4 mm polyps in the ascending colon,                             removed with a cold snare. Resected and retrieved.                           - One 4 mm polyp in the transverse colon, removed                            with a cold snare. Resected and retrieved.                           - One 5 mm polyp at the recto-sigmoid colon,                            removed with a cold snare. Resected and retrieved.                           - Diverticulosis in the left colon.                           - Non-bleeding internal hemorrhoids.                           - The examination was otherwise normal. Recommendation:           - Patient has a contact number available for                            emergencies. The signs and symptoms of potential                            delayed complications were discussed with the                            patient. Return to normal activities tomorrow.                            Written discharge instructions were provided to the  patient.                           - Resume previous diet.                           - Continue present medications.                           - No aspirin, ibuprofen, naproxen, or other                            non-steroidal anti-inflammatory drugs for 2 weeks                            after polyp removal.                           - Await pathology results.                           - Repeat colonoscopy is recommended. The                            colonoscopy date will be determined after pathology                            results from today's exam become available for                            review. Procedure Code(s):        --- Professional ---                           [phone], Colonoscopy, flexible; with removal of                            tumor(s), polyp(s), or other lesion(s) by snare                            technique CPT copyright 2016 [employer]. All rights reserved. Remo Lipps P. Havery Moros, MD Carlota Raspberry. Jeanifer Halliday, MD 01/12/2016  3:13:34 PM This report has been signed electronically. Number of Addenda: 0

## 2016-01-12 NOTE — Progress Notes (Signed)
To PACU pt awake and alert Report to RN

## 2016-01-12 NOTE — Progress Notes (Signed)
Called to room to assist during endoscopic procedure.  Patient ID and intended procedure confirmed with present staff. Received instructions for my participation in the procedure from the performing physician.  

## 2016-01-13 ENCOUNTER — Telehealth: Payer: Self-pay | Admitting: *Deleted

## 2016-01-13 NOTE — Telephone Encounter (Signed)
  Follow up Call-  Call back number 01/12/2016  Post procedure Call Back phone  # [phone]  Permission to leave phone message Yes     Patient questions:  Message left to call us if necessary.

## 2016-01-14 ENCOUNTER — Encounter (HOSPITAL_COMMUNITY): Payer: Self-pay | Admitting: Emergency Medicine

## 2016-01-14 ENCOUNTER — Emergency Department (HOSPITAL_COMMUNITY)
Admission: EM | Admit: 2016-01-14 | Discharge: 2016-01-14 | Disposition: A | Discharge status: Home / Self Care | Payer: BLUE CROSS/BLUE SHIELD | Attending: Emergency Medicine | Admitting: Emergency Medicine

## 2016-01-14 ENCOUNTER — Emergency Department (HOSPITAL_COMMUNITY)
Admission: EM | Admit: 2016-01-14 | Discharge: 2016-01-14 | Disposition: A | Discharge status: Home / Self Care | Payer: BLUE CROSS/BLUE SHIELD | Source: Home / Self Care | Attending: Emergency Medicine | Admitting: Emergency Medicine

## 2016-01-14 ENCOUNTER — Encounter (HOSPITAL_COMMUNITY): Payer: Self-pay

## 2016-01-14 DIAGNOSIS — G44309 Post-traumatic headache, unspecified, not intractable: Secondary | ICD-10-CM | POA: Insufficient documentation

## 2016-01-14 DIAGNOSIS — E785 Hyperlipidemia, unspecified: Secondary | ICD-10-CM | POA: Insufficient documentation

## 2016-01-14 DIAGNOSIS — W228XXA Striking against or struck by other objects, initial encounter: Secondary | ICD-10-CM | POA: Diagnosis not present

## 2016-01-14 DIAGNOSIS — Z7984 Long term (current) use of oral hypoglycemic drugs: Secondary | ICD-10-CM | POA: Insufficient documentation

## 2016-01-14 DIAGNOSIS — Z79899 Other long term (current) drug therapy: Secondary | ICD-10-CM

## 2016-01-14 DIAGNOSIS — E119 Type 2 diabetes mellitus without complications: Secondary | ICD-10-CM | POA: Insufficient documentation

## 2016-01-14 DIAGNOSIS — Y990 Civilian activity done for income or pay: Secondary | ICD-10-CM | POA: Diagnosis not present

## 2016-01-14 DIAGNOSIS — S0181XD Laceration without foreign body of other part of head, subsequent encounter: Secondary | ICD-10-CM

## 2016-01-14 DIAGNOSIS — X58XXXD Exposure to other specified factors, subsequent encounter: Secondary | ICD-10-CM | POA: Insufficient documentation

## 2016-01-14 DIAGNOSIS — Y9389 Activity, other specified: Secondary | ICD-10-CM | POA: Diagnosis not present

## 2016-01-14 DIAGNOSIS — S0101XA Laceration without foreign body of scalp, initial encounter: Secondary | ICD-10-CM

## 2016-01-14 DIAGNOSIS — S0181XA Laceration without foreign body of other part of head, initial encounter: Secondary | ICD-10-CM | POA: Insufficient documentation

## 2016-01-14 DIAGNOSIS — Y9289 Other specified places as the place of occurrence of the external cause: Secondary | ICD-10-CM | POA: Insufficient documentation

## 2016-01-14 MED ORDER — BACITRACIN ZINC 500 UNIT/GM EX OINT: 1.0000 "application " | TOPICAL_OINTMENT | Freq: Every day | CUTANEOUS | Status: DC

## 2016-01-14 MED ORDER — LIDOCAINE-EPINEPHRINE (PF) 2 %-1:200000 IJ SOLN
10.0000 mL | Freq: Once | INTRAMUSCULAR | Status: AC
  Administered 2016-01-14: 10 mL via INTRADERMAL
  Filled 2016-01-14: qty 20

## 2016-01-14 MED ORDER — BACITRACIN ZINC 500 UNIT/GM EX OINT
TOPICAL_OINTMENT | Freq: Two times a day (BID) | CUTANEOUS | Status: DC
  Filled 2016-01-14: qty 1.8

## 2016-01-14 MED ORDER — ACETAMINOPHEN 325 MG PO TABS
650.0000 mg | ORAL_TABLET | Freq: Once | ORAL | Status: AC
  Administered 2016-01-14: 650 mg via ORAL
  Filled 2016-01-14: qty 2

## 2016-01-14 NOTE — ED Notes (Signed)
Pt was here earlier today for lac to forehead, sutures placed, dressing applied.  Pt here to have site assessed and dressing changed d.t pain.

## 2016-01-14 NOTE — ED Provider Notes (Signed)
CSN: [id number]     Arrival date & time 01/14/16  1507 History   First MD Initiated Contact with Patient 01/14/16 [phone]     Chief Complaint  Patient presents with  . Facial Laceration   HPI Comments: 55 year old male presents with blunt trauma to his forehead. He was working on a fence when he pulled too hard on a metal pipe which hit him in the forehead. Denies LOC, nausea, vomiting, vision changes, memory loss. Not anticoagulated. He also has a 2cm laceration on his forehead. He is up to date on his tetanus.   The history is provided by the patient.    Past Medical History  Diagnosis Date  . Diabetes mellitus without complication ([place])   . Hyperlipidemia    History reviewed. No pertinent past surgical history. Family History  Problem Relation Age of Onset  . Heart disease Father   . Colon cancer Neg Hx    Social History  Substance Use Topics  . Smoking status: Never Smoker   . Smokeless tobacco: Current User    Types: Chew  . Alcohol Use: No    Review of Systems  Constitutional: Negative.   HENT: Positive for facial swelling. Negative for dental problem, ear discharge and nosebleeds.   Eyes: Negative for visual disturbance.  Musculoskeletal: Negative for neck pain.  Skin: Positive for wound.  Neurological: Positive for light-headedness. Negative for syncope, speech difficulty and weakness.  Hematological: Does not bruise/bleed easily.  Psychiatric/Behavioral: Negative for confusion.    Allergies  Review of patient's allergies indicates no known allergies.  Home Medications   Prior to Admission medications   Medication Sig Start Date End Date Taking? Authorizing Provider  atorvastatin (LIPITOR) 20 MG tablet Take 1 tablet (20 mg total) by mouth daily. 11/04/15   Ezekiel Slocumb, PA-C  glucose blood (COOL BLOOD GLUCOSE TEST STRIPS) test strip Use as instructed 11/04/15   Ezekiel Slocumb, PA-C  ibuprofen (ADVIL,MOTRIN) 200 MG tablet Take 200 mg by mouth every 6 (six) hours as  needed for mild pain. Reported on 01/12/2016    Historical Provider, MD  metFORMIN (GLUCOPHAGE) 500 MG tablet Take 1 tablet (500 mg total) by mouth 2 (two) times daily with a meal. 11/04/15   Ezekiel Slocumb, PA-C  sildenafil (VIAGRA) 100 MG tablet Take 0.5-1 tablets (50-100 mg total) by mouth daily as needed for erectile dysfunction. Patient not taking: Reported on 12/29/2015 10/11/15   Bennett Scrape V, PA-C   BP 148/77 mmHg  Pulse 99  Temp(Src) 98.3 F (36.8 C)  Resp 15  SpO2 100% Physical Exam  Constitutional: He appears well-developed and well-nourished. No distress.  HENT:  Head: Normocephalic. Head is with laceration. Head is without raccoon's eyes and without Battle's sign.    Right Ear: No drainage.  Left Ear: No drainage.  Nose: No nasal deformity.  Mouth/Throat: Normal dentition.  Minimal swelling over L forehead. Minor abrasion on nose  Eyes: Conjunctivae and EOM are normal. Pupils are equal, round, and reactive to light. Right eye exhibits no discharge. No foreign body present in the right eye. Left eye exhibits no discharge. No foreign body present in the left eye.  Musculoskeletal:  Mental Status:  Alert, oriented, thought content appropriate, able to give a coherent history. Speech fluent without evidence of aphasia. Able to follow 2 step commands without difficulty.  Cranial Nerves:  II:  Peripheral visual fields grossly normal, pupils equal, round, reactive to light III,IV, VI: ptosis not present, extra-ocular motions intact  bilaterally  V,VII: smile symmetric, facial light touch sensation equal VIII: hearing grossly normal to voice  X: uvula elevates symmetrically  XI: bilateral shoulder shrug symmetric and strong XII: midline tongue extension without fassiculations Sensory: Light touch normal over scalp Cerebellar: normal finger-to-nose with bilateral upper extremities Gait: normal gait and balance    Skin: Skin is warm and dry.  Psychiatric: He has a normal mood and  affect.    ED Course  .Marland KitchenLaceration Repair Date/Time: 01/14/2016 4:18 PM Performed by: Janetta Hora MARIE Authorized by: Janetta Hora MARIE Consent: Verbal consent obtained. Risks and benefits: risks, benefits and alternatives were discussed Consent given by: patient Patient understanding: patient states understanding of the procedure being performed Patient consent: the patient's understanding of the procedure matches consent given Procedure consent: procedure consent matches procedure scheduled Patient identity confirmed: verbally with patient and arm band Body area: head/neck Location details: forehead Laceration length: 2 cm Foreign bodies: no foreign bodies Tendon involvement: none Nerve involvement: none Vascular damage: no Anesthesia: local infiltration Local anesthetic: lidocaine 2% with epinephrine Anesthetic total: 3 ml Patient sedated: no Preparation: Patient was prepped and draped in the usual sterile fashion. Irrigation solution: saline Amount of cleaning: standard Debridement: none Degree of undermining: none Skin closure: 6-0 nylon Number of sutures: 4 Technique: simple Approximation: close Approximation difficulty: simple Dressing: 4x4 sterile gauze and antibiotic ointment Patient tolerance: Patient tolerated the procedure well with no immediate complications   (including critical care time)  MDM   Final diagnoses:  Scalp laceration, initial encounter  Post-traumatic headache, not intractable, unspecified chronicity pattern   CT of head deferred due to low suspicion  for acute brain bleed based on MOI, history, and PE. GSC of 15. No LOC and not anticoagulated. No signs of depressed skull fx or basilar skull fx. No focal neuro deficit. Work note given. Pt advised to return to the ED if he develops severe headache, dizziness, LOC/syncope, nausea, vomiting, or vision changes. Patient informed of clinical course, understand medical decision-making process, and  agree with plan.  Recardo Evangelist, PA-C 01/14/16 [place], PA-C 01/14/16 1652  Sherwood Gambler, MD 01/15/16 0800

## 2016-01-14 NOTE — ED Notes (Signed)
Per [facility], pt was putting a fence together, pulled a piece back, edge of metal pipe hit head. Didn't fall, didn't lose consciousness, no dizziness, no vision changes, no n/v, has one inch lac to forehead, bleeding controlled. AAOX4. Ambulatory to bed

## 2016-01-14 NOTE — Discharge Instructions (Signed)
Laceration Care, Adult °A laceration is a cut that goes through all layers of the skin. The cut also goes into the tissue that is right under the skin. Some cuts heal on their own. Others need to be closed with stitches (sutures), staples, skin adhesive strips, or wound glue. Taking care of your cut lowers your risk of infection and helps your cut to heal better. °HOW TO TAKE CARE OF YOUR CUT °For stitches or staples: °· Keep the wound clean and dry. °· If you were given a bandage (dressing), you should change it at least one time per day or as told by your doctor. You should also change it if it gets wet or dirty. °· Keep the wound completely dry for the first 24 hours or as told by your doctor. After that time, you may take a shower or a bath. However, make sure that the wound is not soaked in water until after the stitches or staples have been removed. °· Clean the wound one time each day or as told by your doctor: °¨ Wash the wound with soap and water. °¨ Rinse the wound with water until all of the soap comes off. °¨ Pat the wound dry with a clean towel. Do not rub the wound. °· After you clean the wound, put a thin layer of antibiotic ointment on it as told by your doctor. This ointment: °¨ Helps to prevent infection. °¨ Keeps the bandage from sticking to the wound. °· Have your stitches or staples removed as told by your doctor. °General Instructions  °· To help prevent scarring, make sure to cover your wound with sunscreen whenever you are outside after stitches are removed, after adhesive strips are removed, or when wound glue stays in place and the wound is healed. Make sure to wear a sunscreen of at least 30 SPF. °· Take over-the-counter and prescription medicines only as told by your doctor. °· If you were given antibiotic medicine or ointment, take or apply it as told by your doctor. Do not stop using the antibiotic even if your wound is getting better. °· Do not scratch or pick at the wound. °· Keep all  follow-up visits as told by your doctor. This is important. °· Check your wound every day for signs of infection. Watch for: °¨ Redness, swelling, or pain. °¨ Fluid, blood, or pus. °· Raise (elevate) the injured area above the level of your heart while you are sitting or lying down, if possible. °GET HELP IF: °· You got a tetanus shot and you have any of these problems at the injection site: °¨ Swelling. °¨ Very bad pain. °¨ Redness. °¨ Bleeding. °· You have a fever. °· A wound that was closed breaks open. °· You notice a bad smell coming from your wound or your bandage. °· You notice something coming out of the wound, such as wood or glass. °· Medicine does not help your pain. °· You have more redness, swelling, or pain at the site of your wound. °· You have fluid, blood, or pus coming from your wound. °· You notice a change in the color of your skin near your wound. °· You need to change the bandage often because fluid, blood, or pus is coming from the wound. °· You start to have a new rash. °· You start to have numbness around the wound. °GET HELP RIGHT AWAY IF: °· You have very bad swelling around the wound. °· Your pain suddenly gets worse and is very bad. °·   You notice painful lumps near the wound or on skin that is anywhere on your body. °· You have a red streak going away from your wound. °· The wound is on your hand or foot and you cannot move a finger or toe like you usually can. °· The wound is on your hand or foot and you notice that your fingers or toes look pale or bluish. °  °This information is not intended to replace advice given to you by your health care provider. Make sure you discuss any questions you have with your health care provider. °  °Document Released: 04/02/2008 Document Revised: 03/01/2015 Document Reviewed: 10/11/2014 °Elsevier Interactive Patient Education ©2016 [employer] ° °

## 2016-01-14 NOTE — ED Provider Notes (Signed)
CSN: [id number]     Arrival date & time 01/14/16  2238 History   First MD Initiated Contact with Patient 01/14/16 2300     Chief Complaint  Patient presents with  . Wound Check     (Consider location/radiation/quality/duration/timing/severity/associated sxs/prior Treatment) HPI Comments: Patient presents today for recheck of a laceration on his forehead.  He was seen in the ED earlier today and had the laceration sutured.  He returns today because he was under the understanding that he had to return to the ED for dressing changes.  He states that he noticed some blood on the dressing that was covering the wound so wanted to have it checked.  He reports some associated pain of the sutured area.  He has not taken anything for pain prior to arrival.  He denies any nausea, vomiting, vision changes, or any other symptoms at this time.  Patient is a 55 y.o. male presenting with wound check. The history is provided by the patient.  Wound Check    Past Medical History  Diagnosis Date  . Diabetes mellitus without complication ([place])   . Hyperlipidemia    History reviewed. No pertinent past surgical history. Family History  Problem Relation Age of Onset  . Heart disease Father   . Colon cancer Neg Hx    Social History  Substance Use Topics  . Smoking status: Never Smoker   . Smokeless tobacco: Current User    Types: Chew  . Alcohol Use: No    Review of Systems  All other systems reviewed and are negative.     Allergies  Review of patient's allergies indicates no known allergies.  Home Medications   Prior to Admission medications   Medication Sig Start Date End Date Taking? Authorizing Provider  atorvastatin (LIPITOR) 20 MG tablet Take 1 tablet (20 mg total) by mouth daily. 11/04/15   Ezekiel Slocumb, PA-C  bacitracin ointment Apply 1 application topically daily. 01/14/16   Recardo Evangelist, PA-C  glucose blood (COOL BLOOD GLUCOSE TEST STRIPS) test strip Use as instructed 11/04/15    Ezekiel Slocumb, PA-C  ibuprofen (ADVIL,MOTRIN) 200 MG tablet Take 200 mg by mouth every 6 (six) hours as needed for mild pain. Reported on 01/12/2016    Historical Provider, MD  metFORMIN (GLUCOPHAGE) 500 MG tablet Take 1 tablet (500 mg total) by mouth 2 (two) times daily with a meal. 11/04/15   Bennett Scrape V, PA-C   BP 143/92 mmHg  Pulse 108  Temp(Src) 98 F (36.7 C) (Oral)  Resp 16  Ht 5\' 9"  (1.753 m)  Wt 89.948 kg  BMI 29.27 kg/m2  SpO2 98% Physical Exam  Constitutional: He appears well-developed and well-nourished.  HENT:  Head: Normocephalic.    Neck: Normal range of motion. Neck supple.  Cardiovascular: Normal rate, regular rhythm and normal heart sounds.   Pulmonary/Chest: Effort normal and breath sounds normal.  Neurological: He is alert.  Skin: Skin is warm.  Psychiatric: He has a normal mood and affect.  Nursing note and vitals reviewed.   ED Course  Procedures (including critical care time) Labs Review Labs Reviewed - No data to display  Imaging Review No results found. I have personally reviewed and evaluated these images and lab results as part of my medical decision-making.   EKG Interpretation None      MDM   Final diagnoses:  None   Patient presents today due to pain of his forehead in the area that was sutured earlier today.  He also wanted to have the dressing changes.  Sutures intact.  No signs of infection.  Patient stable for discharge.  Return precautions given.    Hyman Bible, PA-C 01/14/16 2317  Virgel Manifold, MD 01/19/16 0030

## 2016-01-19 ENCOUNTER — Encounter (HOSPITAL_COMMUNITY): Payer: Self-pay | Admitting: Emergency Medicine

## 2016-01-19 ENCOUNTER — Emergency Department (INDEPENDENT_AMBULATORY_CARE_PROVIDER_SITE_OTHER)
Admission: EM | Admit: 2016-01-19 | Discharge: 2016-01-19 | Disposition: A | Discharge status: Home / Self Care | Payer: BLUE CROSS/BLUE SHIELD | Source: Home / Self Care | Attending: Family Medicine | Admitting: Family Medicine

## 2016-01-19 ENCOUNTER — Encounter: Payer: Self-pay | Admitting: Gastroenterology

## 2016-01-19 DIAGNOSIS — Z4802 Encounter for removal of sutures: Secondary | ICD-10-CM

## 2016-01-19 NOTE — Discharge Instructions (Signed)

## 2016-01-19 NOTE — ED Provider Notes (Signed)
CSN: [id number]     Arrival date & time 01/19/16  1405 History   First MD Initiated Contact with Patient 01/19/16 1646     Chief Complaint  Patient presents with  . Follow-up   (Consider location/radiation/quality/duration/timing/severity/associated sxs/prior Treatment) HPI Comments: Person here for suture removal of laceration that had been repaired in emergency department approximately 5 days ago. Laceration is located over the left eyebrow. According to the documentation 4 sutures were placed and there are currently for sutures intact and present.   Past Medical History  Diagnosis Date  . Diabetes mellitus without complication ([place])   . Hyperlipidemia    History reviewed. No pertinent past surgical history. Family History  Problem Relation Age of Onset  . Heart disease Father   . Colon cancer Neg Hx    Social History  Substance Use Topics  . Smoking status: Never Smoker   . Smokeless tobacco: Current User    Types: Chew  . Alcohol Use: No    Review of Systems  Constitutional: Negative.   Skin: Positive for wound. Negative for color change and rash.  Neurological: Negative.   All other systems reviewed and are negative.   Allergies  Review of patient's allergies indicates no known allergies.  Home Medications   Prior to Admission medications   Medication Sig Start Date End Date Taking? Authorizing Provider  atorvastatin (LIPITOR) 20 MG tablet Take 1 tablet (20 mg total) by mouth daily. 11/04/15  Yes Bennett Scrape V, PA-C  metFORMIN (GLUCOPHAGE) 500 MG tablet Take 1 tablet (500 mg total) by mouth 2 (two) times daily with a meal. 11/04/15  Yes Bennett Scrape V, PA-C  bacitracin ointment Apply 1 application topically daily. 01/14/16   Recardo Evangelist, PA-C  glucose blood (COOL BLOOD GLUCOSE TEST STRIPS) test strip Use as instructed 11/04/15   Ezekiel Slocumb, PA-C  ibuprofen (ADVIL,MOTRIN) 200 MG tablet Take 200 mg by mouth every 6 (six) hours as needed for mild pain. Reported on  01/12/2016    Historical Provider, MD   Meds Ordered and Administered this Visit  Medications - No data to display  BP 129/87 mmHg  Pulse 93  Temp(Src) 97.2 F (36.2 C) (Oral)  Resp 16  SpO2 98% No data found.   Physical Exam  Constitutional: He appears well-developed and well-nourished. No distress.  HENT:  Laceration over the left eyebrow has been repaired with 4 nylon sutures. They are currently intact and present. Edges well approximated. No signs of infection. No discoloration, no erythema, no drainage or bleeding.  Neurological: He is alert. No cranial nerve deficit. He exhibits normal muscle tone.  Skin: Skin is warm and dry.  Nursing note and vitals reviewed.   ED Course  .Suture Removal Date/Time: 01/19/2016 5:17 PM Performed by: Marcha Dutton, Devery Odwyer Authorized by: Ihor Gully D Consent: Verbal consent obtained. Risks and benefits: risks, benefits and alternatives were discussed Consent given by: patient Patient understanding: patient states understanding of the procedure being performed Body area: head/neck Location details: left eyebrow Wound Appearance: clean Sutures Removed: 4 Post-removal: dressing applied Patient tolerance: Patient tolerated the procedure well with no immediate complications   (including critical care time)  Labs Review Labs Reviewed - No data to display  Imaging Review No results found.   Visual Acuity Review  Right Eye Distance:   Left Eye Distance:   Bilateral Distance:    Right Eye Near:   Left Eye Near:    Bilateral Near:  MDM   1. Visit for suture removal    All 4 sutures removed, none remain bandaid    Janne Napoleon, NP 01/19/16 1720

## 2016-01-19 NOTE — ED Notes (Signed)
Here for a f/u and to have stitches removed... Voices no new concerns... A&O x4... No acute distress.  

## 2016-02-18 ENCOUNTER — Encounter (HOSPITAL_COMMUNITY): Payer: Self-pay | Admitting: Emergency Medicine

## 2016-02-18 DIAGNOSIS — E785 Hyperlipidemia, unspecified: Secondary | ICD-10-CM | POA: Diagnosis not present

## 2016-02-18 DIAGNOSIS — R51 Headache: Secondary | ICD-10-CM | POA: Insufficient documentation

## 2016-02-18 DIAGNOSIS — Z7984 Long term (current) use of oral hypoglycemic drugs: Secondary | ICD-10-CM | POA: Diagnosis not present

## 2016-02-18 DIAGNOSIS — Z79899 Other long term (current) drug therapy: Secondary | ICD-10-CM | POA: Diagnosis not present

## 2016-02-18 DIAGNOSIS — R079 Chest pain, unspecified: Secondary | ICD-10-CM | POA: Diagnosis present

## 2016-02-18 DIAGNOSIS — K219 Gastro-esophageal reflux disease without esophagitis: Secondary | ICD-10-CM | POA: Diagnosis not present

## 2016-02-18 DIAGNOSIS — E119 Type 2 diabetes mellitus without complications: Secondary | ICD-10-CM | POA: Insufficient documentation

## 2016-02-18 LAB — CBC
HEMATOCRIT: 40.6 % (ref 39.0–52.0)
Hemoglobin: 13.6 g/dL (ref 13.0–17.0)
MCH: 30.2 pg (ref 26.0–34.0)
MCHC: 33.5 g/dL (ref 30.0–36.0)
MCV: 90 fL (ref 78.0–100.0)
Platelets: 326 10*3/uL (ref 150–400)
RBC: 4.51 MIL/uL (ref 4.22–5.81)
RDW: 13.3 % (ref 11.5–15.5)
WBC: 5.5 10*3/uL (ref 4.0–10.5)

## 2016-02-18 LAB — BASIC METABOLIC PANEL
Anion gap: 13 (ref 5–15)
BUN: 14 mg/dL (ref 6–20)
CHLORIDE: 99 mmol/L — AB (ref 101–111)
CO2: 22 mmol/L (ref 22–32)
Calcium: 9.4 mg/dL (ref 8.9–10.3)
Creatinine, Ser: 0.96 mg/dL (ref 0.61–1.24)
GFR calc non Af Amer: >60 mL/min (ref >60)
Glucose, Bld: 342 mg/dL — ABNORMAL HIGH (ref 65–99)
POTASSIUM: 4.1 mmol/L (ref 3.5–5.1)
SODIUM: 134 mmol/L — AB (ref 135–145)

## 2016-02-18 LAB — I-STAT TROPONIN, ED: Troponin i, poc: 0 ng/mL (ref 0.00–0.08)

## 2016-02-18 NOTE — ED Notes (Signed)
Pt. reports intermittent left chest pain with dizziness and nausea onset this evening , denies SOB or diaphoresis , no chest pain at arrival .

## 2016-02-19 ENCOUNTER — Emergency Department (HOSPITAL_COMMUNITY): Payer: BLUE CROSS/BLUE SHIELD

## 2016-02-19 ENCOUNTER — Emergency Department (HOSPITAL_COMMUNITY)
Admission: EM | Admit: 2016-02-19 | Discharge: 2016-02-19 | Disposition: A | Discharge status: Home / Self Care | Payer: BLUE CROSS/BLUE SHIELD | Attending: Emergency Medicine | Admitting: Emergency Medicine

## 2016-02-19 DIAGNOSIS — K219 Gastro-esophageal reflux disease without esophagitis: Secondary | ICD-10-CM

## 2016-02-19 DIAGNOSIS — R079 Chest pain, unspecified: Secondary | ICD-10-CM

## 2016-02-19 LAB — I-STAT TROPONIN, ED: TROPONIN I, POC: 0 ng/mL (ref 0.00–0.08)

## 2016-02-19 MED ORDER — SODIUM CHLORIDE 0.9 % IV BOLUS (SEPSIS)
1000.0000 mL | Freq: Once | INTRAVENOUS | Status: AC
  Administered 2016-02-19: 1000 mL via INTRAVENOUS

## 2016-02-19 MED ORDER — PANTOPRAZOLE SODIUM 40 MG PO TBEC: 40.0000 mg | DELAYED_RELEASE_TABLET | Freq: Every day | ORAL | Status: DC

## 2016-02-19 MED ORDER — DIPHENHYDRAMINE HCL 50 MG/ML IJ SOLN
25.0000 mg | Freq: Once | INTRAMUSCULAR | Status: AC
  Administered 2016-02-19: 25 mg via INTRAVENOUS
  Filled 2016-02-19: qty 1

## 2016-02-19 MED ORDER — GI COCKTAIL ~~LOC~~
30.0000 mL | Freq: Once | ORAL | Status: AC
  Administered 2016-02-19: 30 mL via ORAL
  Filled 2016-02-19: qty 30

## 2016-02-19 MED ORDER — ASPIRIN 81 MG PO CHEW
324.0000 mg | CHEWABLE_TABLET | Freq: Once | ORAL | Status: AC
  Administered 2016-02-19: 324 mg via ORAL
  Filled 2016-02-19: qty 4

## 2016-02-19 MED ORDER — METOCLOPRAMIDE HCL 5 MG/ML IJ SOLN
10.0000 mg | Freq: Once | INTRAMUSCULAR | Status: AC
  Administered 2016-02-19: 10 mg via INTRAVENOUS
  Filled 2016-02-19: qty 2

## 2016-02-19 NOTE — Discharge Instructions (Signed)
Your evaluation today did not show any sign of heart problems. Her ECG was normal and heart enzymes were normal. I suspect that your pain was from acid reflux. Please make an appointment with your primary care provider for follow-up. Return if symptoms are worsening.  Nonspecific Chest Pain  Chest pain can be caused by many different conditions. There is always a chance that your pain could be related to something serious, such as a heart attack or a blood clot in your lungs. Chest pain can also be caused by conditions that are not life-threatening. If you have chest pain, it is very important to follow up with your health care provider. CAUSES  Chest pain can be caused by:  Heartburn.  Pneumonia or bronchitis.  Anxiety or stress.  Inflammation around your heart (pericarditis) or lung (pleuritis or pleurisy).  A blood clot in your lung.  A collapsed lung (pneumothorax). It can develop suddenly on its own (spontaneous pneumothorax) or from trauma to the chest.  Shingles infection (varicella-zoster virus).  Heart attack.  Damage to the bones, muscles, and cartilage that make up your chest wall. This can include:  Bruised bones due to injury.  Strained muscles or cartilage due to frequent or repeated coughing or overwork.  Fracture to one or more ribs.  Sore cartilage due to inflammation (costochondritis). RISK FACTORS  Risk factors for chest pain may include:  Activities that increase your risk for trauma or injury to your chest.  Respiratory infections or conditions that cause frequent coughing.  Medical conditions or overeating that can cause heartburn.  Heart disease or family history of heart disease.  Conditions or health behaviors that increase your risk of developing a blood clot.  Having had chicken pox (varicella zoster). SIGNS AND SYMPTOMS Chest pain can feel like:  Burning or tingling on the surface of your chest or deep in your chest.  Crushing, pressure,  aching, or squeezing pain.  Dull or sharp pain that is worse when you move, cough, or take a deep breath.  Pain that is also felt in your back, neck, shoulder, or arm, or pain that spreads to any of these areas. Your chest pain may come and go, or it may stay constant. DIAGNOSIS Lab tests or other studies may be needed to find the cause of your pain. Your health care provider may have you take a test called an ambulatory ECG (electrocardiogram). An ECG records your heartbeat patterns at the time the test is performed. You may also have other tests, such as:  Transthoracic echocardiogram (TTE). During echocardiography, sound waves are used to create a picture of all of the heart structures and to look at how blood flows through your heart.  Transesophageal echocardiogram (TEE).This is a more advanced imaging test that obtains images from inside your body. It allows your health care provider to see your heart in finer detail.  Cardiac monitoring. This allows your health care provider to monitor your heart rate and rhythm in real time.  Holter monitor. This is a portable device that records your heartbeat and can help to diagnose abnormal heartbeats. It allows your health care provider to track your heart activity for several days, if needed.  Stress tests. These can be done through exercise or by taking medicine that makes your heart beat more quickly.  Blood tests.  Imaging tests. TREATMENT  Your treatment depends on what is causing your chest pain. Treatment may include:  Medicines. These may include:  Acid blockers for heartburn.  Anti-inflammatory  medicine.  Pain medicine for inflammatory conditions.  Antibiotic medicine, if an infection is present.  Medicines to dissolve blood clots.  Medicines to treat coronary artery disease.  Supportive care for conditions that do not require medicines. This may include:  Resting.  Applying heat or cold packs to injured  areas.  Limiting activities until pain decreases. HOME CARE INSTRUCTIONS  If you were prescribed an antibiotic medicine, finish it all even if you start to feel better.  Avoid any activities that bring on chest pain.  Do not use any tobacco products, including cigarettes, chewing tobacco, or electronic cigarettes. If you need help quitting, ask your health care provider.  Do not drink alcohol.  Take medicines only as directed by your health care provider.  Keep all follow-up visits as directed by your health care provider. This is important. This includes any further testing if your chest pain does not go away.  If heartburn is the cause for your chest pain, you may be told to keep your head raised (elevated) while sleeping. This reduces the chance that acid will go from your stomach into your esophagus.  Make lifestyle changes as directed by your health care provider. These may include:  Getting regular exercise. Ask your health care provider to suggest some activities that are safe for you.  Eating a heart-healthy diet. A registered dietitian can help you to learn healthy eating options.  Maintaining a healthy weight.  Managing diabetes, if necessary.  Reducing stress. SEEK MEDICAL CARE IF:  Your chest pain does not go away after treatment.  You have a rash with blisters on your chest.  You have a fever. SEEK IMMEDIATE MEDICAL CARE IF:   Your chest pain is worse.  You have an increasing cough, or you cough up blood.  You have severe abdominal pain.  You have severe weakness.  You faint.  You have chills.  You have sudden, unexplained chest discomfort.  You have sudden, unexplained discomfort in your arms, back, neck, or jaw.  You have shortness of breath at any time.  You suddenly start to sweat, or your skin gets clammy.  You feel nauseous or you vomit.  You suddenly feel light-headed or dizzy.  Your heart begins to beat quickly, or it feels like it  is skipping beats. These symptoms may represent a serious problem that is an emergency. Do not wait to see if the symptoms will go away. Get medical help right away. Call your local emergency services (911 in the [place] Do not drive yourself to the hospital.   This information is not intended to replace advice given to you by your health care provider. Make sure you discuss any questions you have with your health care provider.   Document Released: 07/25/2005 Document Revised: 11/05/2014 Document Reviewed: 05/21/2014 Elsevier Interactive Patient Education 2016 [place].   Gastroesophageal Reflux Disease, Adult Normally, food travels down the esophagus and stays in the stomach to be digested. However, when a person has gastroesophageal reflux disease (GERD), food and stomach acid move back up into the esophagus. When this happens, the esophagus becomes sore and inflamed. Over time, GERD can create small holes (ulcers) in the lining of the esophagus.  CAUSES This condition is caused by a problem with the muscle between the esophagus and the stomach (lower esophageal sphincter, or LES). Normally, the LES muscle closes after food passes through the esophagus to the stomach. When the LES is weakened or abnormal, it does not close properly, and that allows  food and stomach acid to go back up into the esophagus. The LES can be weakened by certain dietary substances, medicines, and medical conditions, including:  Tobacco use.  Pregnancy.  Having a hiatal hernia.  Heavy alcohol use.  Certain foods and beverages, such as coffee, chocolate, onions, and peppermint. RISK FACTORS This condition is more likely to develop in:  People who have an increased body weight.  People who have connective tissue disorders.  People who use NSAID medicines. SYMPTOMS Symptoms of this condition include:  Heartburn.  Difficult or painful swallowing.  The feeling of having a lump in the  throat.  Abitter taste in the mouth.  Bad breath.  Having a large amount of saliva.  Having an upset or bloated stomach.  Belching.  Chest pain.  Shortness of breath or wheezing.  Ongoing (chronic) cough or a night-time cough.  Wearing away of tooth enamel.  Weight loss. Different conditions can cause chest pain. Make sure to see your health care provider if you experience chest pain. DIAGNOSIS Your health care provider will take a medical history and perform a physical exam. To determine if you have mild or severe GERD, your health care provider may also monitor how you respond to treatment. You may also have other tests, including:  An endoscopy toexamine your stomach and esophagus with a small camera.  A test thatmeasures the acidity level in your esophagus.  A test thatmeasures how much pressure is on your esophagus.  A barium swallow or modified barium swallow to show the shape, size, and functioning of your esophagus. TREATMENT The goal of treatment is to help relieve your symptoms and to prevent complications. Treatment for this condition may vary depending on how severe your symptoms are. Your health care provider may recommend:  Changes to your diet.  Medicine.  Surgery. HOME CARE INSTRUCTIONS Diet  Follow a diet as recommended by your health care provider. This may involve avoiding foods and drinks such as:  Coffee and tea (with or without caffeine).  Drinks that containalcohol.  Energy drinks and sports drinks.  Carbonated drinks or sodas.  Chocolate and cocoa.  Peppermint and mint flavorings.  Garlic and onions.  Horseradish.  Spicy and acidic foods, including peppers, chili powder, curry powder, vinegar, hot sauces, and barbecue sauce.  Citrus fruit juices and citrus fruits, such as oranges, lemons, and limes.  Tomato-based foods, such as red sauce, chili, salsa, and pizza with red sauce.  Fried and fatty foods, such as donuts,  french fries, potato chips, and high-fat dressings.  High-fat meats, such as hot dogs and fatty cuts of red and white meats, such as rib eye steak, sausage, ham, and bacon.  High-fat dairy items, such as whole milk, butter, and cream cheese.  Eat small, frequent meals instead of large meals.  Avoid drinking large amounts of liquid with your meals.  Avoid eating meals during the 2-3 hours before bedtime.  Avoid lying down right after you eat.  Do not exercise right after you eat. General Instructions  Pay attention to any changes in your symptoms.  Take over-the-counter and prescription medicines only as told by your health care provider. Do not take aspirin, ibuprofen, or other NSAIDs unless your health care provider told you to do so.  Do not use any tobacco products, including cigarettes, chewing tobacco, and e-cigarettes. If you need help quitting, ask your health care provider.  Wear loose-fitting clothing. Do not wear anything tight around your waist that causes pressure on your abdomen.  Raise (elevate) the head of your bed 6 inches (15cm).  Try to reduce your stress, such as with yoga or meditation. If you need help reducing stress, ask your health care provider.  If you are overweight, reduce your weight to an amount that is healthy for you. Ask your health care provider for guidance about a safe weight loss goal.  Keep all follow-up visits as told by your health care provider. This is important. SEEK MEDICAL CARE IF:  You have new symptoms.  You have unexplained weight loss.  You have difficulty swallowing, or it hurts to swallow.  You have wheezing or a persistent cough.  Your symptoms do not improve with treatment.  You have a hoarse voice. SEEK IMMEDIATE MEDICAL CARE IF:  You have pain in your arms, neck, jaw, teeth, or back.  You feel sweaty, dizzy, or light-headed.  You have chest pain or shortness of breath.  You vomit and your vomit looks like  blood or coffee grounds.  You faint.  Your stool is bloody or black.  You cannot swallow, drink, or eat.   This information is not intended to replace advice given to you by your health care provider. Make sure you discuss any questions you have with your health care provider.   Document Released: 07/25/2005 Document Revised: 07/06/2015 Document Reviewed: 02/09/2015 Elsevier Interactive Patient Education 2016 [employer]  Pantoprazole tablets What is this medicine? PANTOPRAZOLE (pan TOE pra zole) prevents the production of acid in the stomach. It is used to treat gastroesophageal reflux disease (GERD), inflammation of the esophagus, and Zollinger-Ellison syndrome. This medicine may be used for other purposes; ask your health care provider or pharmacist if you have questions. What should I tell my health care provider before I take this medicine? They need to know if you have any of these conditions: -liver disease -low levels of magnesium in the blood -an unusual or allergic reaction to omeprazole, lansoprazole, pantoprazole, rabeprazole, other medicines, foods, dyes, or preservatives -pregnant or trying to get pregnant -breast-feeding How should I use this medicine? Take this medicine by mouth. Swallow the tablets whole with a drink of water. Follow the directions on the prescription label. Do not crush, break, or chew. Take your medicine at regular intervals. Do not take your medicine more often than directed. Talk to your pediatrician regarding the use of this medicine in children. While this drug may be prescribed for children as young as 5 years for selected conditions, precautions do apply. Overdosage: If you think you have taken too much of this medicine contact a poison control center or emergency room at once. NOTE: This medicine is only for you. Do not share this medicine with others. What if I miss a dose? If you miss a dose, take it as soon as you can. If it is almost  time for your next dose, take only that dose. Do not take double or extra doses. What may interact with this medicine? Do not take this medicine with any of the following medications: -atazanavir -nelfinavir This medicine may also interact with the following medications: -ampicillin -delavirdine -erlotinib -iron salts -medicines for fungal infections like ketoconazole, itraconazole and voriconazole -methotrexate -mycophenolate mofetil -warfarin This list may not describe all possible interactions. Give your health care provider a list of all the medicines, herbs, non-prescription drugs, or dietary supplements you use. Also tell them if you smoke, drink alcohol, or use illegal drugs. Some items may interact with your medicine. What should I watch for while using  this medicine? It can take several days before your stomach pain gets better. Check with your doctor or health care professional if your condition does not start to get better, or if it gets worse. You may need blood work done while you are taking this medicine. What side effects may I notice from receiving this medicine? Side effects that you should report to your doctor or health care professional as soon as possible: -allergic reactions like skin rash, itching or hives, swelling of the face, lips, or tongue -bone, muscle or joint pain -breathing problems -chest pain or chest tightness -dark yellow or brown urine -dizziness -fast, irregular heartbeat -feeling faint or lightheaded -fever or sore throat -muscle spasm -palpitations -redness, blistering, peeling or loosening of the skin, including inside the mouth -seizures -tremors -unusual bleeding or bruising -unusually weak or tired -yellowing of the eyes or skin Side effects that usually do not require medical attention (Report these to your doctor or health care professional if they continue or are bothersome.): -constipation -diarrhea -dry  mouth -headache -nausea This list may not describe all possible side effects. Call your doctor for medical advice about side effects. You may report side effects to FDA at 1-800-FDA-1088. Where should I keep my medicine? Keep out of the reach of children. Store at room temperature between 15 and 30 degrees C (59 and 86 degrees F). Protect from light and moisture. Throw away any unused medicine after the expiration date. NOTE: This sheet is a summary. It may not cover all possible information. If you have questions about this medicine, talk to your doctor, pharmacist, or health care provider.    2016, Elsevier/[employer]. (2014-12-03 14:45:56)

## 2016-02-19 NOTE — ED Provider Notes (Signed)
CSN: [id number]     Arrival date & time 02/18/16  2246 History  By signing my name below, I, Wesley Davila, attest that this documentation has been prepared under the direction and in the presence of Wesley Fuel, MD. Electronically Signed: Hansel Davila, ED Scribe. 02/19/2016. 1:41 AM.    Chief Complaint  Patient presents with  . Chest Pain   The history is provided by the patient. No language interpreter was used.   HPI Comments: Wesley Davila is a 55 y.o. male with h/o DM, HLD who presents to the Emergency Department complaining of moderate, burning, left sided 9/10 CP with radiation to the throat onset last night at [facility] while at rest. Pt states associated nausea, occipital HA. Pt describes his HA as sharp. Pt states that his chest pain is worsened when lying flat and alleviated when sitting. Pt denies taking OTC medications at home to improve symptoms. He states h/o similar symptoms and reports he has not received an official diagnosis from his PCP. No h/o HTN. Pt is a non-smoker. FHx of MI from father at age 46. No h/o cardiac stress test. Denies SOB, diaphoresis, cough, dizziness, photophobia, visual disturbance. PCP Dr. Jonelle Davila.   Past Medical History  Diagnosis Date  . Diabetes mellitus without complication ([place])   . Hyperlipidemia    History reviewed. No pertinent past surgical history. Family History  Problem Relation Age of Onset  . Heart disease Father   . Colon cancer Neg Hx    Social History  Substance Use Topics  . Smoking status: Never Smoker   . Smokeless tobacco: Current User    Types: Chew  . Alcohol Use: No    Review of Systems  Constitutional: Negative for diaphoresis.  Eyes: Negative for photophobia and visual disturbance.  Respiratory: Negative for cough and shortness of breath.   Cardiovascular: Positive for chest pain.  Gastrointestinal: Positive for nausea.  Neurological: Positive for headaches. Negative for dizziness.  All other systems reviewed and are  negative.  Allergies  Review of patient's allergies indicates no known allergies.  Home Medications   Prior to Admission medications   Medication Sig Start Date End Date Taking? Authorizing Provider  atorvastatin (LIPITOR) 20 MG tablet Take 1 tablet (20 mg total) by mouth daily. 11/04/15  Yes Bennett Scrape V, PA-C  metFORMIN (GLUCOPHAGE) 500 MG tablet Take 1 tablet (500 mg total) by mouth 2 (two) times daily with a meal. 11/04/15  Yes Bennett Scrape V, PA-C  bacitracin ointment Apply 1 application topically daily. Patient not taking: Reported on 02/19/2016 01/14/16   Recardo Evangelist, PA-C  glucose blood (COOL BLOOD GLUCOSE TEST STRIPS) test strip Use as instructed 11/04/15   Ezekiel Slocumb, PA-C   BP 136/87 mmHg  Pulse 93  Temp(Src) 98.2 F (36.8 C) (Oral)  Resp 20  Ht 5\' 9"  (1.753 m)  Wt 198 lb (89.812 kg)  BMI 29.23 kg/m2  SpO2 96% Physical Exam  Constitutional: He is oriented to person, place, and time. He appears well-developed and well-nourished.  HENT:  Head: Normocephalic and atraumatic.  Mild tenderness over the temporalis muscles and insertion of the paracervical muscles.   Eyes: Conjunctivae are normal. Pupils are equal, round, and reactive to light.  Neck: Normal range of motion. Neck supple. No JVD present.  Cardiovascular: Normal rate, regular rhythm and normal heart sounds.  Exam reveals no gallop and no friction rub.   No murmur heard. Pulmonary/Chest: Effort normal and breath sounds normal. He has no wheezes. He has  no rales. He exhibits no tenderness.  Abdominal: Soft. He exhibits no distension and no mass. There is tenderness. There is no rebound and no guarding.  Mild epigastric tenderness.   Musculoskeletal: Normal range of motion. He exhibits no edema.  Lymphadenopathy:    He has no cervical adenopathy.  Neurological: He is alert and oriented to person, place, and time. No cranial nerve deficit. He exhibits normal muscle tone. Coordination normal.  Skin: Skin is warm  and dry. No rash noted.  Psychiatric: He has a normal mood and affect. His behavior is normal. Judgment and thought content normal.  Nursing note and vitals reviewed.   ED Course  Procedures (including critical care time) DIAGNOSTIC STUDIES: Oxygen Saturation is 96% on RA, adequate by my interpretation.    COORDINATION OF CARE: 1:34 AM Discussed treatment plan with pt at bedside which includes CXR, lab work, EKG and pt agreed to plan.   Labs Review Results for orders placed or performed during the hospital encounter of [id number]  Basic metabolic panel  Result Value Ref Range   Sodium 134 (L) 135 - 145 mmol/L   Potassium 4.1 3.5 - 5.1 mmol/L   Chloride 99 (L) 101 - 111 mmol/L   CO2 22 22 - 32 mmol/L   Glucose, Bld 342 (H) 65 - 99 mg/dL   BUN 14 6 - 20 mg/dL   Creatinine, Ser 0.96 0.61 - 1.24 mg/dL   Calcium 9.4 8.9 - 10.3 mg/dL   GFR calc non Af Amer >60 >60 mL/min   GFR calc Af Amer >60 >60 mL/min   Anion gap 13 5 - 15  CBC  Result Value Ref Range   WBC 5.5 4.0 - 10.5 K/uL   RBC 4.51 4.22 - 5.81 MIL/uL   Hemoglobin 13.6 13.0 - 17.0 g/dL   HCT 40.6 39.0 - 52.0 %   MCV 90.0 78.0 - 100.0 fL   MCH 30.2 26.0 - 34.0 pg   MCHC 33.5 30.0 - 36.0 g/dL   RDW 13.3 11.5 - 15.5 %   Platelets 326 150 - 400 K/uL  I-stat troponin, ED  Result Value Ref Range   Troponin i, poc 0.00 0.00 - 0.08 ng/mL   Comment 3          I-stat troponin, ED  Result Value Ref Range   Troponin i, poc 0.00 0.00 - 0.08 ng/mL   Comment 3           Imaging Review Dg Chest 2 View  02/19/2016  CLINICAL DATA:  Chest pain tonight.  Diabetes.  Nonsmoker. EXAM: CHEST  2 VIEW COMPARISON:  01/12/2015 FINDINGS: Shallow inspiration with atelectasis in the lung bases. Normal heart size and pulmonary vascularity. No focal airspace disease or consolidation in the lungs. No blunting of costophrenic angles. No pneumothorax. Mediastinal contours appear intact. IMPRESSION: Shallow inspiration with atelectasis in the lung  bases. Electronically Signed   By: Lucienne Capers M.D.   On: 02/19/2016 00:18   I have personally reviewed and evaluated these images and lab results as part of my medical decision-making.   EKG Interpretation   Date/Time:  Saturday February 18 2016 22:52:03 EDT Ventricular Rate:  92 PR Interval:  154 QRS Duration: 88 QT Interval:  374 QTC Calculation: 462 Wesley Davila Axis:   18 Text Interpretation:  Normal sinus rhythm Nonspecific T wave abnormality  Prolonged QT Abnormal ECG When compared with ECG of 01/12/2015, No  significant change was found Confirmed by [facility]  MD, Sharika Mosquera ([id number]) on  02/18/2016  11:11:14 PM      MDM   Final diagnoses:  Chest pain, unspecified chest pain type  GERD without esophagitis    Chest pain in patient with cardiac risk factors of diabetes and hyperlipidemia. However, pattern of pain is more suspicious for GERD. Doubt pulmonary embolism. ECG is unchanged from baseline. He was given GI cocktail with excellent relief of symptoms. He was kept in the ED with no recurrence of pain and delta troponin was normal. He is discharged with prescription for pantoprazole. Follow-up with PCP. Old records were reviewed, and he has no relevant past visits.  I personally performed the services described in this documentation, which was scribed in my presence. The recorded information has been reviewed and is accurate.      Wesley Fuel, [place] [id number]

## 2016-03-13 ENCOUNTER — Encounter (HOSPITAL_COMMUNITY): Payer: Self-pay | Admitting: Emergency Medicine

## 2016-03-13 DIAGNOSIS — E119 Type 2 diabetes mellitus without complications: Secondary | ICD-10-CM | POA: Insufficient documentation

## 2016-03-13 DIAGNOSIS — R1030 Lower abdominal pain, unspecified: Secondary | ICD-10-CM | POA: Insufficient documentation

## 2016-03-13 LAB — URINALYSIS, ROUTINE W REFLEX MICROSCOPIC
Bilirubin Urine: NEGATIVE
Glucose, UA: >1000 mg/dL — AB
HGB URINE DIPSTICK: NEGATIVE
Ketones, ur: NEGATIVE mg/dL
LEUKOCYTES UA: NEGATIVE
Nitrite: NEGATIVE
Protein, ur: NEGATIVE mg/dL
SPECIFIC GRAVITY, URINE: 1.031 — AB (ref 1.005–1.030)
pH: 5.5 (ref 5.0–8.0)

## 2016-03-13 LAB — COMPREHENSIVE METABOLIC PANEL
ALT: 18 U/L (ref 17–63)
ANION GAP: 10 (ref 5–15)
AST: 17 U/L (ref 15–41)
Albumin: 3.8 g/dL (ref 3.5–5.0)
Alkaline Phosphatase: 45 U/L (ref 38–126)
BUN: 11 mg/dL (ref 6–20)
CALCIUM: 9.5 mg/dL (ref 8.9–10.3)
CHLORIDE: 100 mmol/L — AB (ref 101–111)
CO2: 23 mmol/L (ref 22–32)
Creatinine, Ser: 0.97 mg/dL (ref 0.61–1.24)
GFR calc non Af Amer: >60 mL/min (ref >60)
Glucose, Bld: 209 mg/dL — ABNORMAL HIGH (ref 65–99)
Potassium: 3.9 mmol/L (ref 3.5–5.1)
SODIUM: 133 mmol/L — AB (ref 135–145)
Total Bilirubin: 0.5 mg/dL (ref 0.3–1.2)
Total Protein: 7.5 g/dL (ref 6.5–8.1)

## 2016-03-13 LAB — CBC
HCT: 42.5 % (ref 39.0–52.0)
HEMOGLOBIN: 14.2 g/dL (ref 13.0–17.0)
MCH: 30 pg (ref 26.0–34.0)
MCHC: 33.4 g/dL (ref 30.0–36.0)
MCV: 89.7 fL (ref 78.0–100.0)
Platelets: 350 10*3/uL (ref 150–400)
RBC: 4.74 MIL/uL (ref 4.22–5.81)
RDW: 13.2 % (ref 11.5–15.5)
WBC: 6.4 10*3/uL (ref 4.0–10.5)

## 2016-03-13 LAB — URINE MICROSCOPIC-ADD ON

## 2016-03-13 LAB — LIPASE, BLOOD: LIPASE: 34 U/L (ref 11–51)

## 2016-03-13 NOTE — ED Notes (Signed)
Pt. reports intermittent low abdominal pain onset this evening , denies nausea or vomitting , no diarrhea or fever .

## 2016-03-14 ENCOUNTER — Emergency Department (HOSPITAL_COMMUNITY)
Admission: EM | Admit: 2016-03-14 | Discharge: 2016-03-14 | Disposition: A | Discharge status: Home / Self Care | Payer: BLUE CROSS/BLUE SHIELD | Attending: Emergency Medicine | Admitting: Emergency Medicine

## 2016-04-15 ENCOUNTER — Emergency Department (HOSPITAL_COMMUNITY)
Admission: EM | Admit: 2016-04-15 | Discharge: 2016-04-15 | Disposition: A | Discharge status: Home / Self Care | Payer: BLUE CROSS/BLUE SHIELD | Attending: Emergency Medicine | Admitting: Emergency Medicine

## 2016-04-15 ENCOUNTER — Encounter (HOSPITAL_COMMUNITY): Payer: Self-pay | Admitting: Emergency Medicine

## 2016-04-15 DIAGNOSIS — R739 Hyperglycemia, unspecified: Secondary | ICD-10-CM

## 2016-04-15 DIAGNOSIS — E785 Hyperlipidemia, unspecified: Secondary | ICD-10-CM | POA: Insufficient documentation

## 2016-04-15 DIAGNOSIS — Z7984 Long term (current) use of oral hypoglycemic drugs: Secondary | ICD-10-CM | POA: Insufficient documentation

## 2016-04-15 DIAGNOSIS — R631 Polydipsia: Secondary | ICD-10-CM | POA: Diagnosis not present

## 2016-04-15 DIAGNOSIS — R358 Other polyuria: Secondary | ICD-10-CM | POA: Diagnosis not present

## 2016-04-15 DIAGNOSIS — E1165 Type 2 diabetes mellitus with hyperglycemia: Secondary | ICD-10-CM | POA: Insufficient documentation

## 2016-04-15 LAB — CBC
HCT: 40.6 % (ref 39.0–52.0)
Hemoglobin: 13.2 g/dL (ref 13.0–17.0)
MCH: 29 pg (ref 26.0–34.0)
MCHC: 32.5 g/dL (ref 30.0–36.0)
MCV: 89.2 fL (ref 78.0–100.0)
PLATELETS: 313 10*3/uL (ref 150–400)
RBC: 4.55 MIL/uL (ref 4.22–5.81)
RDW: 13.2 % (ref 11.5–15.5)
WBC: 6.1 10*3/uL (ref 4.0–10.5)

## 2016-04-15 LAB — URINALYSIS, ROUTINE W REFLEX MICROSCOPIC
Bilirubin Urine: NEGATIVE
HGB URINE DIPSTICK: NEGATIVE
KETONES UR: 15 mg/dL — AB
LEUKOCYTES UA: NEGATIVE
Nitrite: NEGATIVE
PROTEIN: NEGATIVE mg/dL
Specific Gravity, Urine: 1.029 (ref 1.005–1.030)
pH: 6.5 (ref 5.0–8.0)

## 2016-04-15 LAB — CBG MONITORING, ED
Glucose-Capillary: 285 mg/dL — ABNORMAL HIGH (ref 65–99)
Glucose-Capillary: 298 mg/dL — ABNORMAL HIGH (ref 65–99)
Glucose-Capillary: 205 mg/dL — ABNORMAL HIGH (ref 65–99)

## 2016-04-15 LAB — BASIC METABOLIC PANEL
ANION GAP: 9 (ref 5–15)
BUN: 15 mg/dL (ref 6–20)
CHLORIDE: 100 mmol/L — AB (ref 101–111)
CO2: 23 mmol/L (ref 22–32)
Calcium: 8.8 mg/dL — ABNORMAL LOW (ref 8.9–10.3)
Creatinine, Ser: 1 mg/dL (ref 0.61–1.24)
GFR calc non Af Amer: >60 mL/min (ref >60)
Glucose, Bld: 287 mg/dL — ABNORMAL HIGH (ref 65–99)
POTASSIUM: 4.2 mmol/L (ref 3.5–5.1)
Sodium: 132 mmol/L — ABNORMAL LOW (ref 135–145)

## 2016-04-15 LAB — URINE MICROSCOPIC-ADD ON

## 2016-04-15 MED ORDER — SODIUM CHLORIDE 0.9 % IV BOLUS (SEPSIS)
1000.0000 mL | Freq: Once | INTRAVENOUS | Status: AC
Start: 2016-04-15 — End: 2016-04-15
  Administered 2016-04-15: 1000 mL via INTRAVENOUS

## 2016-04-15 MED ORDER — SODIUM CHLORIDE 0.9 % IV BOLUS (SEPSIS)
1000.0000 mL | Freq: Once | INTRAVENOUS | Status: AC
  Administered 2016-04-15: 1000 mL via INTRAVENOUS

## 2016-04-15 NOTE — ED Notes (Signed)
Patient arrives with complaint of hyperglycemia accompanied by polyuria and polydipsia. States onset yesterday. Currently takes only Metformen for DM2 management.

## 2016-04-15 NOTE — ED Notes (Signed)
Discharge instructions reviewed - voiced understanding 

## 2016-04-15 NOTE — ED Notes (Signed)
Checked CBG 285, RN David informed

## 2016-04-15 NOTE — ED Provider Notes (Signed)
CSN: [id number]     Arrival date & time 04/15/16  0246 History   First MD Initiated Contact with Patient 04/15/16 0325     Chief Complaint  Patient presents with  . Hyperglycemia  . Polyuria  . Polydipsia     (Consider location/radiation/quality/duration/timing/severity/associated sxs/prior Treatment) HPI Comments: Patient with a history of DM on metformin (500 mg BID) and HLD presents with concern for elevated blood sugar tonight at home. He states it was 337 when he checked it but reports that it was over 400 a week ago when he last checked. No nausea, vomiting, abdominal pain. He states he feels generally weak. No recent change in metformin dosing.  Patient is a 55 y.o. male presenting with hyperglycemia. The history is provided by the patient. No language interpreter was used.  Hyperglycemia Blood sugar level PTA:  337 Severity:  Mild Associated symptoms: increased thirst (mild) and polyuria (mild)   Associated symptoms: no fever, no nausea and no vomiting     Past Medical History  Diagnosis Date  . Diabetes mellitus without complication ([place])   . Hyperlipidemia    History reviewed. No pertinent past surgical history. Family History  Problem Relation Age of Onset  . Heart disease Father   . Colon cancer Neg Hx    Social History  Substance Use Topics  . Smoking status: Never Smoker   . Smokeless tobacco: Current User    Types: Chew  . Alcohol Use: No    Review of Systems  Constitutional: Negative for fever and chills.  Respiratory: Negative.   Cardiovascular: Negative.   Gastrointestinal: Negative.  Negative for nausea and vomiting.  Endocrine: Positive for polydipsia (mild) and polyuria (mild).  Musculoskeletal: Negative.   Skin: Negative.   Neurological: Negative.       Allergies  Review of patient's allergies indicates no known allergies.  Home Medications   Prior to Admission medications   Medication Sig Start Date End Date Taking? Authorizing  Provider  atorvastatin (LIPITOR) 20 MG tablet Take 1 tablet (20 mg total) by mouth daily. 11/04/15  Yes Ezekiel Slocumb, PA-C  metFORMIN (GLUCOPHAGE) 500 MG tablet Take 1 tablet (500 mg total) by mouth 2 (two) times daily with a meal. 11/04/15  Yes Ezekiel Slocumb, PA-C  pantoprazole (PROTONIX) 40 MG tablet Take 1 tablet (40 mg total) by mouth daily. [id number]  Yes Delora Fuel, MD  glucose blood (COOL BLOOD GLUCOSE TEST STRIPS) test strip Use as instructed 11/04/15   Ezekiel Slocumb, PA-C   BP 110/73 mmHg  Pulse 115  Temp(Src) 98.2 F (36.8 C) (Oral)  Resp 18  SpO2 100% Physical Exam  Constitutional: He is oriented to person, place, and time. He appears well-developed and well-nourished.  HENT:  Head: Normocephalic.  Neck: Normal range of motion. Neck supple.  Cardiovascular: Normal rate and regular rhythm.   No murmur heard. Pulmonary/Chest: Effort normal and breath sounds normal. He has no wheezes. He has no rales.  Abdominal: Soft. Bowel sounds are normal. There is no tenderness. There is no rebound and no guarding.  Musculoskeletal: Normal range of motion.  Neurological: He is alert and oriented to person, place, and time.  Skin: Skin is warm and dry. No rash noted.  Psychiatric: He has a normal mood and affect.    ED Course  Procedures (including critical care time) Labs Review Labs Reviewed  BASIC METABOLIC PANEL - Abnormal; Notable for the following:    Sodium 132 (*)    Chloride 100 (*)  Glucose, Bld 287 (*)    Calcium 8.8 (*)    All other components within normal limits  URINALYSIS, ROUTINE W REFLEX MICROSCOPIC (NOT AT [facility]) - Abnormal; Notable for the following:    Glucose, UA >1000 (*)    Ketones, ur 15 (*)    All other components within normal limits  URINE MICROSCOPIC-ADD ON - Abnormal; Notable for the following:    Squamous Epithelial / LPF 0-5 (*)    Bacteria, UA RARE (*)    All other components within normal limits  CBG MONITORING, ED - Abnormal; Notable for the  following:    Glucose-Capillary 285 (*)    All other components within normal limits  CBG MONITORING, ED - Abnormal; Notable for the following:    Glucose-Capillary 298 (*)    All other components within normal limits  CBC    Imaging Review No results found. I have personally reviewed and evaluated these images and lab results as part of my medical decision-making.   EKG Interpretation None      MDM   Final diagnoses:  None    1. Hyperglycemia  The patient presents concerned about a high blood sugar reading at home of over 300, no symptoms except generalized mild weakness. No evidence of acidosis. Blood sugar here decreasing with IV fluids.  He can be discharged home and is encouraged to follow up with Dr. Jonelle Sidle.    Charlann Lange, PA-C 04/15/16 [place], DO 04/15/16 0700

## 2016-04-15 NOTE — Discharge Instructions (Signed)
Hyperglycemia °Hyperglycemia occurs when the glucose (sugar) in your blood is too high. Hyperglycemia can happen for many reasons, but it most often happens to people who do not know they have diabetes or are not managing their diabetes properly.  °CAUSES  °Whether you have diabetes or not, there are other causes of hyperglycemia. Hyperglycemia can occur when you have diabetes, but it can also occur in other situations that you might not be as aware of, such as: °Diabetes °· If you have diabetes and are having problems controlling your blood glucose, hyperglycemia could occur because of some of the following reasons: °¨ Not following your meal plan. °¨ Not taking your diabetes medications or not taking it properly. °¨ Exercising less or doing less activity than you normally do. °¨ Being sick. °Pre-diabetes °· This cannot be ignored. Before people develop Type 2 diabetes, they almost always have "pre-diabetes." This is when your blood glucose levels are higher than normal, but not yet high enough to be diagnosed as diabetes. Research has shown that some long-term damage to the body, especially the heart and circulatory system, may already be occurring during pre-diabetes. If you take action to manage your blood glucose when you have pre-diabetes, you may delay or prevent Type 2 diabetes from developing. °Stress °· If you have diabetes, you may be "diet" controlled or on oral medications or insulin to control your diabetes. However, you may find that your blood glucose is higher than usual in the hospital whether you have diabetes or not. This is often referred to as "stress hyperglycemia." Stress can elevate your blood glucose. This happens because of hormones put out by the body during times of stress. If stress has been the cause of your high blood glucose, it can be followed regularly by your caregiver. That way he/she can make sure your hyperglycemia does not continue to get worse or progress to  diabetes. °Steroids °· Steroids are medications that act on the infection fighting system (immune system) to block inflammation or infection. One side effect can be a rise in blood glucose. Most people can produce enough extra insulin to allow for this rise, but for those who cannot, steroids make blood glucose levels go even higher. It is not unusual for steroid treatments to "uncover" diabetes that is developing. It is not always possible to determine if the hyperglycemia will go away after the steroids are stopped. A special blood test called an A1c is sometimes done to determine if your blood glucose was elevated before the steroids were started. °SYMPTOMS °· Thirsty. °· Frequent urination. °· Dry mouth. °· Blurred vision. °· Tired or fatigue. °· Weakness. °· Sleepy. °· Tingling in feet or leg. °DIAGNOSIS  °Diagnosis is made by monitoring blood glucose in one or all of the following ways: °· A1c test. This is a chemical found in your blood. °· Fingerstick blood glucose monitoring. °· Laboratory results. °TREATMENT  °First, knowing the cause of the hyperglycemia is important before the hyperglycemia can be treated. Treatment may include, but is not be limited to: °· Education. °· Change or adjustment in medications. °· Change or adjustment in meal plan. °· Treatment for an illness, infection, etc. °· More frequent blood glucose monitoring. °· Change in exercise plan. °· Decreasing or stopping steroids. °· Lifestyle changes. °HOME CARE INSTRUCTIONS  °· Test your blood glucose as directed. °· Exercise regularly. Your caregiver will give you instructions about exercise. Pre-diabetes or diabetes which comes on with stress is helped by exercising. °· Eat wholesome,   balanced meals. Eat often and at regular, fixed times. Your caregiver or nutritionist will give you a meal plan to guide your sugar intake. °· Being at an ideal weight is important. If needed, losing as little as 10 to 15 pounds may help improve blood  glucose levels. °SEEK MEDICAL CARE IF:  °· You have questions about medicine, activity, or diet. °· You continue to have symptoms (problems such as increased thirst, urination, or weight gain). °SEEK IMMEDIATE MEDICAL CARE IF:  °· You are vomiting or have diarrhea. °· Your breath smells fruity. °· You are breathing faster or slower. °· You are very sleepy or incoherent. °· You have numbness, tingling, or pain in your feet or hands. °· You have chest pain. °· Your symptoms get worse even though you have been following your caregiver's orders. °· If you have any other questions or concerns. °  °This information is not intended to replace advice given to you by your health care provider. Make sure you discuss any questions you have with your health care provider. °  °Document Released: 04/10/2001 Document Revised: 01/07/2012 Document Reviewed: 06/21/2015 °Elsevier Interactive Patient Education ©2016 [employer] ° °

## 2016-04-15 NOTE — ED Notes (Signed)
Patient stated he checked his sugar and it was 337.  Stated he got scared since it was high and came to be checked.  Increased urination

## 2016-09-17 ENCOUNTER — Ambulatory Visit (INDEPENDENT_AMBULATORY_CARE_PROVIDER_SITE_OTHER): Payer: BLUE CROSS/BLUE SHIELD | Admitting: Family Medicine

## 2016-09-17 VITALS — BP 126/78 | HR 99 | Temp 98.0°F | Resp 16 | Ht 68.5 in | Wt 187.6 lb

## 2016-09-17 DIAGNOSIS — E782 Mixed hyperlipidemia: Secondary | ICD-10-CM | POA: Diagnosis not present

## 2016-09-17 DIAGNOSIS — R399 Unspecified symptoms and signs involving the genitourinary system: Secondary | ICD-10-CM

## 2016-09-17 DIAGNOSIS — Z0000 Encounter for general adult medical examination without abnormal findings: Secondary | ICD-10-CM

## 2016-09-17 DIAGNOSIS — E119 Type 2 diabetes mellitus without complications: Secondary | ICD-10-CM | POA: Diagnosis not present

## 2016-09-17 DIAGNOSIS — Z125 Encounter for screening for malignant neoplasm of prostate: Secondary | ICD-10-CM | POA: Diagnosis not present

## 2016-09-17 DIAGNOSIS — R079 Chest pain, unspecified: Secondary | ICD-10-CM

## 2016-09-17 NOTE — Progress Notes (Signed)
Chief Complaint  Patient presents with  . Annual Exam    Subjective:  Wesley Davila is a 55 y.o. male here for a health maintenance visit.  Patient is established pt  There are no active problems to display for this patient.  He reports that his diabetes is doing "well". He is compliant with his medications metformin. Fasting glucose 230 this morning and is typically between 200 and 300.  He does not know his last A1c.  Intermittent chest pain Pt reports that he gets a few minutes of chest pains daily He reports that he has been to the ER for this and had ecg and labs and was discharged home He reports that the pain is nonradiating and not associated with eating The pain is not occurring currently  Past Medical History:  Diagnosis Date  . Diabetes mellitus without complication ([place])   . Hyperlipidemia     No past surgical history on file.   Outpatient Medications Prior to Visit  Medication Sig Dispense Refill  . glucose blood (COOL BLOOD GLUCOSE TEST STRIPS) test strip Use as instructed 100 each 12  . metFORMIN (GLUCOPHAGE) 500 MG tablet Take 1 tablet (500 mg total) by mouth 2 (two) times daily with a meal. 60 tablet 5  . atorvastatin (LIPITOR) 20 MG tablet Take 1 tablet (20 mg total) by mouth daily. (Patient not taking: Reported on 09/17/2016) 90 tablet 1  . pantoprazole (PROTONIX) 40 MG tablet Take 1 tablet (40 mg total) by mouth daily. (Patient not taking: Reported on 09/17/2016) 15 tablet 0   No facility-administered medications prior to visit.     No Known Allergies   Family History  Problem Relation Age of Onset  . Heart disease Father   . Colon cancer Neg Hx      Health Habits: Exercise: no regular exercise  Social History   Social History  . Marital status: Married    Spouse name: N/A  . Number of children: N/A  . Years of education: N/A   Occupational History  . Not on file.   Social History Main Topics  . Smoking status: Never Smoker  .  Smokeless tobacco: Current User    Types: Chew  . Alcohol use No  . Drug use: No  . Sexual activity: Not on file   Other Topics Concern  . Not on file   Social History Narrative   ** Merged History Encounter **       History  Alcohol Use No   History  Smoking Status  . Never Smoker  Smokeless Tobacco  . Current User  . Types: Chew   History  Drug Use No    Health Maintenance: See under health Maintenance activity for review of completion dates as well. Immunization History  Administered Date(s) Administered  . Pneumococcal Conjugate-13 10/11/2015  . Tdap 10/11/2015      Depression Screen-PHQ2/9 Depression screen [facility] 2/9 09/17/2016 10/11/2015  Decreased Interest 0 0  Down, Depressed, Hopeless 0 0  PHQ - 2 Score 0 0      Depression Severity and Treatment Recommendations:  0-4= None  5-9= Mild / Treatment: Support, educate to call if worse; return in one month  10-14= Moderate / Treatment: Support, watchful waiting; Antidepressant or Psycotherapy  15-19= Moderately severe / Treatment: Antidepressant OR Psychotherapy  >= 20 = Major depression, severe / Antidepressant AND Psychotherapy    Review of Systems   Review of Systems  Constitutional: Negative for chills, fever and weight loss.  HENT: Negative  for ear pain, hearing loss and tinnitus.   Eyes: Negative for blurred vision, double vision and photophobia.  Respiratory: Negative for cough, hemoptysis, shortness of breath and wheezing.   Cardiovascular: Negative for chest pain, palpitations and orthopnea.  Gastrointestinal: Negative for abdominal pain, nausea and vomiting.  Genitourinary: Negative for dysuria, frequency and urgency.       Occasionally he leaks urine  Skin: Negative for itching and rash.  Neurological: Negative for dizziness, tingling, tremors and headaches.  Psychiatric/Behavioral: Negative for depression. The patient is not nervous/anxious and does not have insomnia.     See HPI for  ROS as well.    Objective:   Vitals:   09/17/16 1631  BP: 126/78  Pulse: 99  Resp: 16  Temp: 98 F (36.7 C)  TempSrc: Oral  SpO2: 98%  Weight: 187 lb 9.6 oz (85.1 kg)  Height: 5' 8.5" (1.74 m)    Body mass index is 28.11 kg/m.  Physical Exam  Constitutional: He appears well-developed and well-nourished.  HENT:  Head: Normocephalic and atraumatic.  Eyes: Conjunctivae and EOM are normal.  Cardiovascular: Normal rate, regular rhythm, normal heart sounds and intact distal pulses.   Pulmonary/Chest: Effort normal and breath sounds normal. No respiratory distress. He has no wheezes.  Abdominal: Soft. Bowel sounds are normal. He exhibits no distension. There is no tenderness. There is no rebound.  Genitourinary: Rectum normal and prostate normal.  Genitourinary Comments: Normal rectal tone Prostate palpated without tenderness, slightly enlarged  Musculoskeletal: Normal range of motion. He exhibits no edema or tenderness.    DRE    Assessment/Plan:   Patient was seen for a health maintenance exam.  Counseled the patient on health maintenance issues. Reviewed her health mainteance schedule and ordered appropriate tests (see orders.) Counseled on regular exercise and weight management. Recommend regular eye exams and dental cleaning.   The following issues were addressed today for health maintenance:   Wesley Davila was seen today for annual exam.  Diagnoses and all orders for this visit:  Annual physical exam- age appropriate screening review -     Comprehensive metabolic panel -     CBC with Differential/Platelet -     Lipid panel -     TSH  Type 2 diabetes mellitus without complication, without long-term current use of insulin ([place])- discussed that his fasting glucose should be around 120 or less for him to be at the appropriate goal  Will need to adjust metformin but waiting on A1c -     Hemoglobin A1c -     EKG 12-Lead -     Ambulatory referral to Cardiology  Mixed  hyperlipidemia- discussed lipid control and goal for pt with diabetes -     Comprehensive metabolic panel -     Lipid panel  Prostate cancer screening- discussed screening for high risk population -     PSA  Lower urinary tract symptoms (LUTS)- pt with LUTS with mildly enlarged prostate Will check for anemia or signs of infection -     CBC with Differential/Platelet  Intermittent chest pain- ECG looks good in the office but since pt has had ER visit and continues to have intermittent cp would recommend follow up with Cardiology -     EKG 12-Lead -     Ambulatory referral to Cardiology    No Follow-up on file.    Body mass index is 28.11 kg/m.:  Discussed the patient's BMI with patient. The BMI body mass index is 28.11 kg/m.  Future Appointments Date Time Provider [place]  12/26/2016 2:00 PM Forrest Moron, MD [facility]    Patient Instructions        IF you received an x-ray today, you will receive an invoice from [facility] Radiology. Please contact [facility]iology at [phone] with questions or concerns regarding your invoice.   IF you received labwork today, you will receive an invoice from [employer]. Please contact [facility] at [phone] with questions or concerns regarding your invoice.   Our billing staff will not be able to assist you with questions regarding bills from these companies.  You will be contacted with the lab results as soon as they are available. The fastest way to get your results is to activate your My Chart account. Instructions are located on the last page of this paperwork. If you have not heard from [place] regarding the results in 2 weeks, please contact this office.     Benign Prostatic Hyperplasia An enlarged prostate (benign prostatic hyperplasia) is common in older men. You may experience the following:  Weak urine stream.  Dribbling.  Feeling like the bladder has not emptied  completely.  Difficulty starting urination.  Getting up frequently at night to urinate.  Urinating more frequently during the day. HOME CARE INSTRUCTIONS  Monitor your prostatic hyperplasia for any changes. The following actions may help to alleviate any discomfort you are experiencing:  Give yourself time when you urinate.  Stay away from alcohol.  Avoid beverages containing caffeine, such as coffee, tea, and colas, because they can make the problem worse.  Avoid decongestants, antihistamines, and some prescription medicines that can make the problem worse.  Follow up with your health care provider for further treatment as recommended. SEEK MEDICAL CARE IF:  You are experiencing progressive difficulty voiding.  Your urine stream is progressively getting narrower.  You are awaking from sleep with the urge to void more frequently.  You are constantly feeling the need to void.  You experience loss of urine, especially in small amounts. SEEK IMMEDIATE MEDICAL CARE IF:   You develop increased pain with urination or are unable to urinate.  You develop severe abdominal pain, vomiting, a high fever, or fainting.  You develop back pain or blood in your urine. MAKE SURE YOU:   Understand these instructions.  Will watch your condition.  Will get help right away if you are not doing well or get worse. This information is not intended to replace advice given to you by your health care provider. Make sure you discuss any questions you have with your health care provider. Document Released: 10/15/2005 Document Revised: 11/05/2014 Document Reviewed: 03/17/2013 Elsevier Interactive Patient Education  2017 [employer].

## 2016-09-17 NOTE — Patient Instructions (Addendum)
     IF you received an x-ray today, you will receive an invoice from [facility] Radiology. Please contact [facility] Radiology at [phone] with questions or concerns regarding your invoice.   IF you received labwork today, you will receive an invoice from [employer]. Please contact [facility] at [phone] with questions or concerns regarding your invoice.   Our billing staff will not be able to assist you with questions regarding bills from these companies.  You will be contacted with the lab results as soon as they are available. The fastest way to get your results is to activate your My Chart account. Instructions are located on the last page of this paperwork. If you have not heard from [place] regarding the results in 2 weeks, please contact this office.     Benign Prostatic Hyperplasia An enlarged prostate (benign prostatic hyperplasia) is common in older men. You may experience the following:  Weak urine stream.  Dribbling.  Feeling like the bladder has not emptied completely.  Difficulty starting urination.  Getting up frequently at night to urinate.  Urinating more frequently during the day. HOME CARE INSTRUCTIONS  Monitor your prostatic hyperplasia for any changes. The following actions may help to alleviate any discomfort you are experiencing:  Give yourself time when you urinate.  Stay away from alcohol.  Avoid beverages containing caffeine, such as coffee, tea, and colas, because they can make the problem worse.  Avoid decongestants, antihistamines, and some prescription medicines that can make the problem worse.  Follow up with your health care provider for further treatment as recommended. SEEK MEDICAL CARE IF:  You are experiencing progressive difficulty voiding.  Your urine stream is progressively getting narrower.  You are awaking from sleep with the urge to void more frequently.  You are constantly feeling the need to  void.  You experience loss of urine, especially in small amounts. SEEK IMMEDIATE MEDICAL CARE IF:   You develop increased pain with urination or are unable to urinate.  You develop severe abdominal pain, vomiting, a high fever, or fainting.  You develop back pain or blood in your urine. MAKE SURE YOU:   Understand these instructions.  Will watch your condition.  Will get help right away if you are not doing well or get worse. This information is not intended to replace advice given to you by your health care provider. Make sure you discuss any questions you have with your health care provider. Document Released: 10/15/2005 Document Revised: 11/05/2014 Document Reviewed: 03/17/2013 Elsevier Interactive Patient Education  2017 [employer].

## 2016-09-18 LAB — CBC WITH DIFFERENTIAL/PLATELET
BASOS ABS: 0 {cells}/uL (ref 0–200)
BASOS PCT: 0 %
EOS ABS: 92 {cells}/uL (ref 15–500)
Eosinophils Relative: 2 %
HEMATOCRIT: 43.3 % (ref 38.5–50.0)
HEMOGLOBIN: 14.7 g/dL (ref 13.2–17.1)
LYMPHS ABS: 2576 {cells}/uL (ref 850–3900)
Lymphocytes Relative: 56 %
MCH: 29.9 pg (ref 27.0–33.0)
MCHC: 33.9 g/dL (ref 32.0–36.0)
MCV: 88.2 fL (ref 80.0–100.0)
MONO ABS: 322 {cells}/uL (ref 200–950)
MPV: 9.5 fL (ref 7.5–12.5)
Monocytes Relative: 7 %
NEUTROS ABS: 1610 {cells}/uL (ref 1500–7800)
Neutrophils Relative %: 35 %
PLATELETS: 340 10*3/uL (ref 140–400)
RBC: 4.91 MIL/uL (ref 4.20–5.80)
RDW: 13.5 % (ref 11.0–15.0)
WBC: 4.6 10*3/uL (ref 3.8–10.8)

## 2016-09-18 LAB — COMPREHENSIVE METABOLIC PANEL
ALBUMIN: 4.1 g/dL (ref 3.6–5.1)
ALT: 13 U/L (ref 9–46)
AST: 12 U/L (ref 10–35)
Alkaline Phosphatase: 42 U/L (ref 40–115)
BUN: 10 mg/dL (ref 7–25)
CALCIUM: 9 mg/dL (ref 8.6–10.3)
CHLORIDE: 97 mmol/L — AB (ref 98–110)
CO2: 26 mmol/L (ref 20–31)
Creat: 0.95 mg/dL (ref 0.70–1.33)
GLUCOSE: 232 mg/dL — AB (ref 65–99)
Potassium: 4.1 mmol/L (ref 3.5–5.3)
Sodium: 133 mmol/L — ABNORMAL LOW (ref 135–146)
Total Bilirubin: 0.5 mg/dL (ref 0.2–1.2)
Total Protein: 7.3 g/dL (ref 6.1–8.1)

## 2016-09-18 LAB — LIPID PANEL
CHOL/HDL RATIO: 5.5 ratio — AB (ref <5.0)
CHOLESTEROL: 304 mg/dL — AB (ref <200)
HDL: 55 mg/dL (ref >40)
LDL Cholesterol: 210 mg/dL — ABNORMAL HIGH (ref <100)
TRIGLYCERIDES: 193 mg/dL — AB (ref <150)
VLDL: 39 mg/dL — AB (ref <30)

## 2016-09-18 LAB — HEMOGLOBIN A1C
Hgb A1c MFr Bld: 11.7 % — ABNORMAL HIGH (ref <5.7)
Mean Plasma Glucose: 289 mg/dL

## 2016-09-18 LAB — PSA: PSA: 0.4 ng/mL (ref <=4.0)

## 2016-09-18 LAB — TSH: TSH: 1.62 mIU/L (ref 0.40–4.50)

## 2016-09-19 ENCOUNTER — Telehealth: Payer: Self-pay | Admitting: Family Medicine

## 2016-09-19 DIAGNOSIS — E782 Mixed hyperlipidemia: Secondary | ICD-10-CM

## 2016-09-19 MED ORDER — ATORVASTATIN CALCIUM 40 MG PO TABS: 40.0000 mg | ORAL_TABLET | Freq: Every day | ORAL | 1 refills | Status: DC

## 2016-09-19 MED ORDER — GLIPIZIDE 5 MG PO TABS: 5.0000 mg | ORAL_TABLET | Freq: Two times a day (BID) | ORAL | 1 refills | Status: DC

## 2016-09-19 MED ORDER — OMEGA-3 1000 MG PO CAPS: 1.0000 | ORAL_CAPSULE | Freq: Every day | ORAL | 11 refills | Status: DC

## 2016-09-19 MED ORDER — METFORMIN HCL 1000 MG PO TABS: 1000.0000 mg | ORAL_TABLET | Freq: Two times a day (BID) | ORAL | 1 refills | Status: DC

## 2016-09-19 NOTE — Telephone Encounter (Signed)
Discussed that his sugars are uncontrolled Advised him to track his readings and return to clinic in 2 months Discussed that his lipids are also uncontrolled He should follow up with Cardiology Will increase lipitor to 40mg  and add omega 3 Will increase metformin to a 1000mg  bid and add glipizide 5mg  bid Plan to monitor for one month and add insulin if he has not been able to improve his diet and decrease his sugars on these medications

## 2016-09-28 HISTORY — PX: NM MYOVIEW LTD: HXRAD82

## 2016-10-03 ENCOUNTER — Ambulatory Visit: Payer: BLUE CROSS/BLUE SHIELD | Admitting: Family Medicine

## 2016-10-17 ENCOUNTER — Encounter: Payer: Self-pay | Admitting: Cardiology

## 2016-10-17 ENCOUNTER — Ambulatory Visit (INDEPENDENT_AMBULATORY_CARE_PROVIDER_SITE_OTHER): Payer: BLUE CROSS/BLUE SHIELD | Admitting: Cardiology

## 2016-10-17 VITALS — BP 114/77 | HR 86 | Ht 69.0 in | Wt 194.2 lb

## 2016-10-17 DIAGNOSIS — E119 Type 2 diabetes mellitus without complications: Secondary | ICD-10-CM | POA: Diagnosis not present

## 2016-10-17 DIAGNOSIS — E1169 Type 2 diabetes mellitus with other specified complication: Secondary | ICD-10-CM | POA: Insufficient documentation

## 2016-10-17 DIAGNOSIS — R079 Chest pain, unspecified: Secondary | ICD-10-CM | POA: Insufficient documentation

## 2016-10-17 DIAGNOSIS — Z8249 Family history of ischemic heart disease and other diseases of the circulatory system: Secondary | ICD-10-CM | POA: Diagnosis not present

## 2016-10-17 DIAGNOSIS — E785 Hyperlipidemia, unspecified: Secondary | ICD-10-CM

## 2016-10-17 NOTE — Assessment & Plan Note (Signed)
Very poorly controlled lipids. He is now on atorvastatin. Discussed dietary modifications.

## 2016-10-17 NOTE — Assessment & Plan Note (Signed)
He is on metformin and glipizide. A1c was well out of control

## 2016-10-17 NOTE — Assessment & Plan Note (Signed)
Chest pain is somewhat atypical in nature, but it is in a patient with significant cardiac risk factors borderline metabolic syndrome. With this saturation, and there is at least moderate risk for CAD.  Within having some symptoms, this this would be a good time to do a screening Myoview stress test to exclude ischemic coronary disease as a baseline. He will need to strongly consider getting an access program and aggressive risk factor modification regardless, but if there is evidence of CAD, then we would need to be more aggressive.

## 2016-10-17 NOTE — Patient Instructions (Addendum)
SCHEDULE [place] has requested that you have en exercise stress myoview. For further information please visit [email or web address]. Please follow instruction sheet, as given.  NO OTHER CHANGES   Your physician recommends that you schedule a follow-up appointment in 1-2 MONTHS WITH DR HARDING. - F/ U RESULTS MYYOVIEW

## 2016-10-17 NOTE — Progress Notes (Signed)
PCP: Barbette Merino, MD  Clinic Note: Chief Complaint  Patient presents with  . New Evaluation    patient referred by PCP for intermittent chest pain. reports no pain today.    HPI: Wesley Davila is a 55 y.o. male with a PMH below who presents today for Cardiology consultation to evaluate intermittent chest pain. He has a history of poorly controlled hyperlipidemia and diabetes mellitus, type II (borderline criteria for metabolic syndrome).  Jeffren Maxted was seen by Dr. Nolon Rod on November 20 with complaints of intermittent chest pain and was referred.  Recent Hospitalizations: Hyperglycemia episode back in June 2017  Studies Reviewed: None  Interval History: He presents here today indicating that he has been having intermittent episodes of somewhat atypical sounding chest discomfort that is just under the left breast. It is feels like a poking sensation in the last maybe 1 or 2 minutes. This can happen randomly throughout the day, but does happen to 3 times a week where he'll have it several times a day. He noted this morning. It is occurring only when he sitting down. He has not noticed anything with exertion, but he is quick to point out that he really does not exert himself very much at all. The most she does walk in from the parking lot to work. 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 He denies any sensation of rapid irregular heartbeat associated with it. He denies having any exacerbation of the symptom with walking or any particular activity, but has not exerted himself to the point where he may have it. He is not having any true syncope or near-syncope, but does have occasional periods will heal feeling "glowing" sensation in his eyes where he sees spots of light and floaters  No resting dyspnea, PND orthopnea or edema. No palpitations/near syncope or TIAs/amaurosis fugax . No melena, hematochezia, hematuria, or epstaxis. No claudication.  ROS: A comprehensive was  performed. Review of Systems  Constitutional: Negative.  Negative for diaphoresis and malaise/fatigue.  HENT: Negative for congestion and nosebleeds.   Eyes: Positive for blurred vision.       Occasional "glowing" spots  Respiratory: Negative.  Negative for shortness of breath.   Cardiovascular:       Per HPI  Gastrointestinal: Positive for heartburn. Negative for abdominal pain, blood in stool and melena.  Genitourinary: Negative for hematuria.  Musculoskeletal: Positive for joint pain.  Neurological: Negative for dizziness (occasional positional) and weakness.  Endo/Heme/Allergies: Negative for environmental allergies.  Psychiatric/Behavioral: Negative for memory loss. The patient is not nervous/anxious and does not have insomnia.   All other systems reviewed and are negative.   Past Medical History:  Diagnosis Date  . Diabetes mellitus without complication ([place])   . Hyperlipidemia     History reviewed. No pertinent surgical history.  Current Meds  Medication Sig  . atorvastatin (LIPITOR) 40 MG tablet Take 1 tablet (40 mg total) by mouth daily.  Marland Kitchen glipiZIDE (GLUCOTROL) 5 MG tablet Take 1 tablet (5 mg total) by mouth 2 (two) times daily before a meal.  . glucose blood (COOL BLOOD GLUCOSE TEST STRIPS) test strip Use as instructed  . metFORMIN (GLUCOPHAGE) 1000 MG tablet Take 1 tablet (1,000 mg total) by mouth 2 (two) times daily with a meal.  . Omega-3 1000 MG CAPS Take 1 capsule (1,000 mg total) by mouth daily.  . pantoprazole (PROTONIX) 40 MG tablet Take 1 tablet (40 mg total) by mouth daily.    No Known Allergies  Social History  Social History  . Marital status: Married    Spouse name: N/A  . Number of children: 3  . Years of education: N/A   Occupational History  . Inspector     [employer]   Social History Main Topics  . Smoking status: Never Smoker  . Smokeless tobacco: Current User    Types: Snuff     Comment: since 1980   . Alcohol use No  . Drug  use: No  . Sexual activity: Not Asked   Other Topics Concern  . None   Social History Narrative   Has 6 brothers, 6 sisters - 33 Brothers with with DM & HTN       Family History  family history includes Diabetes in his brother and brother; Heart disease (age of onset: 38) in his father; Hypertension in his brother and brother; Stroke in his father.  Wt Readings from Last 3 Encounters:  10/17/16 88.1 kg (194 lb 3.2 oz)  09/17/16 85.1 kg (187 lb 9.6 oz)  03/13/16 90.3 kg (199 lb 1 oz)    PHYSICAL EXAM BP 114/77   Pulse 86   Ht 5\' 9"  (1.753 m)   Wt 88.1 kg (194 lb 3.2 oz)   SpO2 99%   BMI 28.68 kg/m  General appearance: alert, cooperative, appears stated age, no distress and borderline obese; well-nourished and well-groomed HEENT: [place]/AT, EOMI, MMM, anicteric sclera Neck: no adenopathy, no carotid bruit and no JVD Lungs: clear to auscultation bilaterally, normal percussion bilaterally and non-labored Heart: regular rate and rhythm, S1&S2 normal, no murmur, click, rub or gallop;  Abdomen: soft, non-tender; bowel sounds normal; no masses,  no organomegaly;  Extremities: extremities normal, atraumatic, no cyanosis, or edema  Pulses: 2+ and symmetric;  Skin: mobility and turgor normal, nails clear without discoloration or sign of infection, no evidence of bleeding or bruising and no lesions noted Neurologic: Mental status: Alert, oriented, thought content appropriate Cranial nerves: normal (II-XII grossly intact)   Adult ECG Report n/a  Other studies Reviewed: Additional studies/ records that were reviewed today include:  Recent Labs:    Lab Results  Component Value Date   HGBA1C 11.7 (H) 09/17/2016   Lab Results  Component Value Date   CREATININE 0.95 09/17/2016   BUN 10 09/17/2016   NA 133 (L) 09/17/2016   K 4.1 09/17/2016   CL 97 (L) 09/17/2016   CO2 26 09/17/2016   Lab Results  Component Value Date   CHOL 304 (H) 09/17/2016   HDL 55 09/17/2016   LDLCALC 210  (H) 09/17/2016   TRIG 193 (H) 09/17/2016   CHOLHDL 5.5 (H) 09/17/2016    ASSESSMENT / PLAN: Problem List Items Addressed This Visit    Type 2 diabetes mellitus without complication, without long-term current use of insulin (HCC) (Chronic)    He is on metformin and glipizide. A1c was well out of control      Relevant Orders   Myocardial Perfusion Imaging   Hyperlipidemia LDL goal <70 (Chronic)    Very poorly controlled lipids. He is now on atorvastatin. Discussed dietary modifications.      Relevant Orders   Myocardial Perfusion Imaging   Family history of cardiovascular disease (Chronic)    With his essentially metabolic syndrome comprehensive hyperlipidemia, diabetes and obesity along with family history of CVD, is reasonable to evaluate for ischemia on a patient having some chest discomfort albeit somewhat atypical.        Relevant Orders   Myocardial Perfusion Imaging   Chest  pain with moderate risk for cardiac etiology - Primary    Chest pain is somewhat atypical in nature, but it is in a patient with significant cardiac risk factors borderline metabolic syndrome. With this saturation, and there is at least moderate risk for CAD.  Within having some symptoms, this this would be a good time to do a screening Myoview stress test to exclude ischemic coronary disease as a baseline. He will need to strongly consider getting an access program and aggressive risk factor modification regardless, but if there is evidence of CAD, then we would need to be more aggressive.      Relevant Orders   Myocardial Perfusion Imaging      Current medicines are reviewed at length with the patient today. (+/- concerns) n/a The following changes have been made: none  Patient Instructions  SCHEDULE [address] [place] physician has requested that you have en exercise stress myoview. For further information please visit [email or web address]. Please follow instruction sheet, as  given.     NO OTHER CHANGES     Your physician recommends that you schedule a follow-up appointment in 1-2 MONTHS WITH DR HARDING. - F/ U RESULTS MYYOVIEW       Studies Ordered:   Orders Placed This Encounter  Procedures  . Myocardial Perfusion Imaging      Glenetta Hew, M.D., M.S. Interventional Cardiologist   Pager # [phone] Phone # [phone] [address] [place], [place] [address]

## 2016-10-17 NOTE — Assessment & Plan Note (Signed)
With his essentially metabolic syndrome comprehensive hyperlipidemia, diabetes and obesity along with family history of CVD, is reasonable to evaluate for ischemia on a patient having some chest discomfort albeit somewhat atypical.

## 2016-10-23 ENCOUNTER — Telehealth (HOSPITAL_COMMUNITY): Payer: Self-pay

## 2016-10-23 NOTE — Telephone Encounter (Signed)
Unable to reach pt

## 2016-10-25 ENCOUNTER — Ambulatory Visit (HOSPITAL_COMMUNITY)
Admission: RE | Admit: 2016-10-25 | Discharge: 2016-10-25 | Disposition: A | Discharge status: Home / Self Care | Payer: BLUE CROSS/BLUE SHIELD | Source: Ambulatory Visit | Attending: Cardiology | Admitting: Cardiology

## 2016-10-25 ENCOUNTER — Telehealth (HOSPITAL_COMMUNITY): Payer: Self-pay

## 2016-10-25 DIAGNOSIS — E785 Hyperlipidemia, unspecified: Secondary | ICD-10-CM | POA: Diagnosis not present

## 2016-10-25 DIAGNOSIS — Z8249 Family history of ischemic heart disease and other diseases of the circulatory system: Secondary | ICD-10-CM | POA: Insufficient documentation

## 2016-10-25 DIAGNOSIS — R079 Chest pain, unspecified: Secondary | ICD-10-CM | POA: Insufficient documentation

## 2016-10-25 DIAGNOSIS — E119 Type 2 diabetes mellitus without complications: Secondary | ICD-10-CM | POA: Insufficient documentation

## 2016-10-25 LAB — MYOCARDIAL PERFUSION IMAGING
CHL RATE OF PERCEIVED EXERTION: 17
CSEPED: 6 min
CSEPEDS: 31 s
CSEPEW: 7.2 METS
MPHR: 164 {beats}/min
Peak HR: 144 {beats}/min
Percent HR: 87 %
Rest HR: 72 {beats}/min

## 2016-10-25 MED ORDER — TECHNETIUM TC 99M TETROFOSMIN IV KIT
10.7000 | PACK | Freq: Once | INTRAVENOUS | Status: AC | PRN
  Administered 2016-10-25: 10.7 via INTRAVENOUS
  Filled 2016-10-25: qty 11

## 2016-10-25 MED ORDER — TECHNETIUM TC 99M TETROFOSMIN IV KIT
31.6000 | PACK | Freq: Once | INTRAVENOUS | Status: AC | PRN
  Administered 2016-10-25: 31.6 via INTRAVENOUS
  Filled 2016-10-25: qty 32

## 2016-10-25 NOTE — Telephone Encounter (Signed)
Encounter complete. 

## 2016-11-10 ENCOUNTER — Ambulatory Visit (HOSPITAL_COMMUNITY)
Admission: EM | Admit: 2016-11-10 | Discharge: 2016-11-10 | Disposition: A | Discharge status: Home / Self Care | Payer: BLUE CROSS/BLUE SHIELD | Attending: Internal Medicine | Admitting: Internal Medicine

## 2016-11-10 ENCOUNTER — Encounter (HOSPITAL_COMMUNITY): Payer: Self-pay | Admitting: Family Medicine

## 2016-11-10 DIAGNOSIS — M7062 Trochanteric bursitis, left hip: Secondary | ICD-10-CM | POA: Diagnosis not present

## 2016-11-10 MED ORDER — DICLOFENAC SODIUM 75 MG PO TBEC: 75.0000 mg | DELAYED_RELEASE_TABLET | Freq: Two times a day (BID) | ORAL | 0 refills | Status: DC

## 2016-11-10 MED ORDER — PREDNISONE 10 MG (21) PO TBPK: 10.0000 mg | ORAL_TABLET | Freq: Every day | ORAL | 0 refills | Status: DC

## 2016-11-10 NOTE — ED Triage Notes (Signed)
Pt here for left lower back, hip pain radiating down left leg that is worsened with walking and lying on that side. Denies any injury. denies heavy lifting sts he woke woke this way a few days ago.

## 2016-11-10 NOTE — ED Provider Notes (Signed)
CSN: [id number]     Arrival date & time 11/10/16  1251 History   First MD Initiated Contact with Patient 11/10/16 1454     Chief Complaint  Patient presents with  . Back Pain  . Leg Pain   (Consider location/radiation/quality/duration/timing/severity/associated sxs/prior Treatment) 56 year old male presents with chief complain of left sided hip pain radiating down to his feet. Pain is described burning and stabbing. It is worse when he lays on his side. He is able to bear weight and walk without assistance and has full range of motion within the joint. He denies recent history of trauma, denies lifting heavy objects, denies twisting his back. No fever, nausea, vomiting, or other red flag symptoms   The history is provided by the patient.  Back Pain  Associated symptoms: leg pain   Leg Pain  Associated symptoms: back pain     Past Medical History:  Diagnosis Date  . Diabetes mellitus without complication ([place])   . Hyperlipidemia    History reviewed. No pertinent surgical history. Family History  Problem Relation Age of Onset  . Heart disease Father 71    MI x 2  . Stroke Father   . Hypertension Brother   . Diabetes Brother   . Hypertension Brother   . Diabetes Brother   . Colon cancer Neg Hx    Social History  Substance Use Topics  . Smoking status: Never Smoker  . Smokeless tobacco: Current User    Types: Snuff     Comment: since 1980   . Alcohol use No    Review of Systems  Reason unable to perform ROS: as covered in HPI.  Musculoskeletal: Positive for back pain.  All other systems reviewed and are negative.   Allergies  Patient has no known allergies.  Home Medications   Prior to Admission medications   Medication Sig Start Date End Date Taking? Authorizing Provider  atorvastatin (LIPITOR) 40 MG tablet Take 1 tablet (40 mg total) by mouth daily. 09/19/16   Forrest Moron, MD  diclofenac (VOLTAREN) 75 MG EC tablet Take 1 tablet (75 mg total) by mouth 2  (two) times daily. 11/10/16   Barnet Glasgow, NP  glipiZIDE (GLUCOTROL) 5 MG tablet Take 1 tablet (5 mg total) by mouth 2 (two) times daily before a meal. 09/19/16   Forrest Moron, MD  glucose blood (COOL BLOOD GLUCOSE TEST STRIPS) test strip Use as instructed 11/04/15   Ezekiel Slocumb, PA-C  metFORMIN (GLUCOPHAGE) 1000 MG tablet Take 1 tablet (1,000 mg total) by mouth 2 (two) times daily with a meal. 09/19/16   Zoe A Nolon Rod, MD  Omega-3 1000 MG CAPS Take 1 capsule (1,000 mg total) by mouth daily. 09/19/16   Forrest Moron, MD  pantoprazole (PROTONIX) 40 MG tablet Take 1 tablet (40 mg total) by mouth daily. [id number]   Delora Fuel, MD  predniSONE (STERAPRED UNI-PAK 21 TAB) 10 MG (21) TBPK tablet Take 1 tablet (10 mg total) by mouth daily. Take 6 tabs by mouth daily  for 2 days, then 5 tabs for 2 days, then 4 tabs for 2 days, then 3 tabs for 2 days, 2 tabs for 2 days, then 1 tab by mouth daily for 2 days 11/10/16   Barnet Glasgow, NP   Meds Ordered and Administered this Visit  Medications - No data to display  BP 115/74 (BP Location: Left Arm)   Pulse 107   Temp 97.1 F (36.2 C) (Oral)   Resp 16  SpO2 100%  No data found.   Physical Exam  Constitutional: He is oriented to person, place, and time. He appears well-developed and well-nourished. No distress.  Cardiovascular: Normal rate and regular rhythm.   Pulmonary/Chest: Effort normal and breath sounds normal.  Abdominal: Soft. Bowel sounds are normal. He exhibits no distension. There is no tenderness.  Musculoskeletal:       Left hip: He exhibits tenderness. He exhibits no swelling, no crepitus and no deformity.       Lumbar back: He exhibits normal range of motion, no tenderness, no bony tenderness, no swelling, no edema, no pain and no spasm.       Legs: Neurological: He is alert and oriented to person, place, and time.  Skin: Skin is warm and dry. Capillary refill takes less than 2 seconds. He is not diaphoretic.  Nursing note  and vitals reviewed.   Urgent Care Course   Clinical Course     Procedures (including critical care time)  Labs Review Labs Reviewed - No data to display  Imaging Review No results found.   Visual Acuity Review  Right Eye Distance:   Left Eye Distance:   Bilateral Distance:    Right Eye Near:   Left Eye Near:    Bilateral Near:         MDM   1. Trochanteric bursitis of left hip   Based on your symptoms, I believe to have a condition called trochanteric bursitis. I am prescribing an extended course of steroids for inflammation. Take 6 tablets for 2 days, then decrease by 1 tablet every other day till finished (6,6,5,5,4,4,3,3,2,2,1,1) I am also prescribing diclofenac, take 1 tablet twice a day. I would avoid sitting for long periods of time, you may use ice on the affected joint 15 minutes at a time up to 4 times a day. I would follow up with your primary care provider should your symptoms fail to improve as you may require a referral to a specialist should your symptoms not improve with treatment.      Barnet Glasgow, NP 11/10/16 1512

## 2016-11-10 NOTE — Discharge Instructions (Signed)
Based on your symptoms, I believe to have a condition called trochanteric bursitis. I am prescribing an extended course of steroids for inflammation. Take 6 tablets for 2 days, then decrease by 1 tablet every other day till finished (6,6,5,5,4,4,3,3,2,2,1,1) I am also prescribing diclofenac, take 1 tablet twice a day. I would avoid sitting for long periods of time, you may use ice on the affected joint 15 minutes at a time up to 4 times a day. I would follow up with your primary care provider should your symptoms fail to improve as you may require a referral to a specialist should your symptoms not improve with treatment.

## 2016-11-20 ENCOUNTER — Encounter: Payer: Self-pay | Admitting: Cardiology

## 2016-11-20 ENCOUNTER — Ambulatory Visit (INDEPENDENT_AMBULATORY_CARE_PROVIDER_SITE_OTHER): Payer: BLUE CROSS/BLUE SHIELD | Admitting: Cardiology

## 2016-11-20 VITALS — BP 124/78 | HR 97 | Ht 69.0 in | Wt 198.0 lb

## 2016-11-20 DIAGNOSIS — R0609 Other forms of dyspnea: Secondary | ICD-10-CM | POA: Diagnosis not present

## 2016-11-20 DIAGNOSIS — R9439 Abnormal result of other cardiovascular function study: Secondary | ICD-10-CM

## 2016-11-20 DIAGNOSIS — E785 Hyperlipidemia, unspecified: Secondary | ICD-10-CM

## 2016-11-20 DIAGNOSIS — I519 Heart disease, unspecified: Secondary | ICD-10-CM | POA: Diagnosis not present

## 2016-11-20 DIAGNOSIS — R079 Chest pain, unspecified: Secondary | ICD-10-CM | POA: Diagnosis not present

## 2016-11-20 DIAGNOSIS — Z8249 Family history of ischemic heart disease and other diseases of the circulatory system: Secondary | ICD-10-CM

## 2016-11-20 NOTE — Progress Notes (Signed)
PCP: Barbette Merino, MD  Clinic Note: Chief Complaint  Patient presents with  . Follow-up    Chest pain evaluation with Myoview stress test    HPI: Wesley Davila is a 56 y.o. male with a PMH below who presents today for Cardiology consultation to evaluate intermittent chest pain. He has a history of poorly controlled hyperlipidemia and diabetes mellitus, type II (borderline criteria for metabolic syndrome).  Wesley Davila wasSeen for initial consultation on 10/17/2016. He had symptoms were somewhat concerning at least moderate risk for CAD and was therefore referred for a Myoview stress test. We also check lipids for risk factor stratification.  Recent Hospitalizations: Hyperglycemia episode back in June 2017  Studies Reviewed:  Study Highlights     The left ventricular ejection fraction is moderately decreased (30-44%).  Nuclear stress EF: 44%.  Blood pressure demonstrated a normal response to exercise.  There was no ST segment deviation noted during stress.  This is an intermediate risk study.   1. Fixed small, mild basal to mid inferior perfusion defect.  Possible diaphragmatic attenuation.  No evidence for ischemia. 2. EF 44%, diffuse hypokinesis.  Visually appears a bit better than that, would suggest echo.  3. Intermediate risk study due to low EF.    Interval History: He presents today again same things are pretty stable. He has not had any worsening of the chest tightness symptoms. He has this strange left-sided sharp discomfort that is laterally located under the left breast that occurs oftentimes at rest and not necessarily better with exertion. It maybe lasts a minute or 2. He also does note a little exertional dyspnea, but nothing worrisome. He now just being relatively out of shape he is more sedentary now than he had been in the past.  The most he does walk in from the parking lot to work. 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 He denies any sensation of  rapid irregular heartbeat associated with it. He denies having any exacerbation of the symptom with walking or any particular activity, but has not exerted himself to the point where he may have it. He is not having any true syncope or near-syncope, but does have occasional periods will heal feeling "glowing" sensation in his eyes where he sees spots of light and floaters  No resting dyspnea, PND orthopnea or edema. No palpitations/near syncope or TIAs/amaurosis fugax . No melena, hematochezia, hematuria, or epstaxis. No claudication.  ROS: A comprehensive was performed. Review of Systems  Constitutional: Negative.  Negative for diaphoresis and malaise/fatigue.  HENT: Negative for congestion and nosebleeds.   Eyes: Positive for blurred vision.       Occasional "glowing" spots  Respiratory: Negative.  Negative for shortness of breath.   Cardiovascular:       Per HPI  Gastrointestinal: Positive for heartburn. Negative for abdominal pain, blood in stool and melena.  Genitourinary: Negative for hematuria.  Musculoskeletal: Positive for joint pain.  Neurological: Negative for dizziness (occasional positional) and weakness.  Endo/Heme/Allergies: Negative for environmental allergies.  Psychiatric/Behavioral: Negative for memory loss. The patient is not nervous/anxious and does not have insomnia.   All other systems reviewed and are negative.   Past Medical History:  Diagnosis Date  . Diabetes mellitus without complication ([place])   . Hyperlipidemia     Past Surgical History:  Procedure Laterality Date  . NM MYOVIEW LTD  09/2016   Fixed small, mild basal-mid inferior perfusion defect likely diaphragmatic attenuation. No evidence of ischemia. EF 44% with diffuse hypokinesis  per computer read, but visually it would appear to be better. Considered intermediate risk.    Current Meds  Medication Sig  . atorvastatin (LIPITOR) 40 MG tablet Take 1 tablet (40 mg total) by mouth daily.  .  diclofenac (VOLTAREN) 75 MG EC tablet Take 1 tablet (75 mg total) by mouth 2 (two) times daily.  Marland Kitchen glipiZIDE (GLUCOTROL) 5 MG tablet Take 1 tablet (5 mg total) by mouth 2 (two) times daily before a meal.  . glucose blood (COOL BLOOD GLUCOSE TEST STRIPS) test strip Use as instructed  . metFORMIN (GLUCOPHAGE) 1000 MG tablet Take 1 tablet (1,000 mg total) by mouth 2 (two) times daily with a meal.  . Omega-3 1000 MG CAPS Take 1 capsule (1,000 mg total) by mouth daily.  . pantoprazole (PROTONIX) 40 MG tablet Take 1 tablet (40 mg total) by mouth daily.  . predniSONE (STERAPRED UNI-PAK 21 TAB) 10 MG (21) TBPK tablet Take 1 tablet (10 mg total) by mouth daily. Take 6 tabs by mouth daily  for 2 days, then 5 tabs for 2 days, then 4 tabs for 2 days, then 3 tabs for 2 days, 2 tabs for 2 days, then 1 tab by mouth daily for 2 days    No Known Allergies  Social History   Social History  . Marital status: Married    Spouse name: N/A  . Number of children: 3  . Years of education: N/A   Occupational History  . Inspector     [employer]   Social History Main Topics  . Smoking status: Never Smoker  . Smokeless tobacco: Current User    Types: Snuff     Comment: since 1980   . Alcohol use No  . Drug use: No  . Sexual activity: Not Asked   Other Topics Concern  . None   Social History Narrative   Has 6 brothers, 6 sisters - 20 Brothers with with DM & HTN       Family History  family history includes Diabetes in his brother and brother; Heart disease (age of onset: 63) in his father; Hypertension in his brother and brother; Stroke in his father.  Wt Readings from Last 3 Encounters:  11/20/16 89.8 kg (198 lb)  10/25/16 88 kg (194 lb)  10/17/16 88.1 kg (194 lb 3.2 oz)    PHYSICAL EXAM BP 124/78   Pulse 97   Ht 5\' 9"  (1.753 m)   Wt 89.8 kg (198 lb)   SpO2 98%   BMI 29.24 kg/m  General appearance: alert, cooperative, appears stated age, no distress and borderline obese;  well-nourished and well-groomed HEENT: [place]/AT, EOMI, MMM, anicteric sclera Neck: no adenopathy, no carotid bruit and no JVD Lungs: clear to auscultation bilaterally, normal percussion bilaterally and non-labored Heart: regular rate and rhythm, S1&S2 normal, no murmur, click, rub or gallop;  Abdomen: soft, non-tender; bowel sounds normal; no masses,  no organomegaly;  Extremities: extremities normal, atraumatic, no cyanosis, or edema  Pulses: 2+ and symmetric;  Skin: Warm and intact Neurologic: Mental status: Alert, oriented, thought content appropriate   Adult ECG Report n/a  Other studies Reviewed: Additional studies/ records that were reviewed today include:  Recent Labs:    Lab Results  Component Value Date   HGBA1C 11.7 (H) 09/17/2016   Lab Results  Component Value Date   CREATININE 0.95 09/17/2016   BUN 10 09/17/2016   NA 133 (L) 09/17/2016   K 4.1 09/17/2016   CL 97 (L) 09/17/2016  CO2 26 09/17/2016   Lab Results  Component Value Date   CHOL 304 (H) 09/17/2016   HDL 55 09/17/2016   LDLCALC 210 (H) 09/17/2016   TRIG 193 (H) 09/17/2016   CHOLHDL 5.5 (H) 09/17/2016    ASSESSMENT / PLAN: Problem List Items Addressed This Visit    Hyperlipidemia LDL goal <70 (Chronic)    Poorly controlled. Now atorvastatin. Need to monitor to see how well it comes down. May require additional therapy.      Family history of cardiovascular disease (Chronic)    He has very poorly controlled lipids. Was started on atorvastatin recently. Will need to monitor to see how well it is being treated on current dose. He may need additional therapy. He also has very poorly controlled diabetes. These combination put him at high risk for CAD.  With resting heart rate of 97, low threshold for starting a beta blocker.      Left ventricular dysfunction - Primary    Not sure how to interpret the Myoview results of his EF. He doesn't really have heart failure symptoms, and there was a question of  this EF and wall motion abnormality. We need to assess with a better test to evaluate for wall motion and ejection fraction. Plan: Check 2-D echocardiogram. -> If there is indeed a reduced ejection fraction with inferior hypokinesis, but I think we need to consider the fixed defect/diaphragmatic attenuation to be more consistent with infarct. In which case, we will consider an invasive ischemic evaluation.        Chest pain with moderate risk for cardiac etiology    He did have atypical symptoms for angina, but also has some exertional dyspnea. He does a cardio risk factors concerning for borderline metabolic syndrome. He has definitely dyslipidemia and diabetes.  His Myoview was not totally normal, the reading physician felt like the interpretation of the EF was probably underestimated. I would like to evaluate this with an echocardiogram, because if he truly does have a reduced EF, with and fixed defect I think we should probably consider ischemic evaluation with cardiac catheterization. However if his echo shows no regional wall motion abnormality, I think we can simply treat risk factors and monitor.      Relevant Orders   ECHOCARDIOGRAM COMPLETE   DOE (dyspnea on exertion)    Probably most related to deconditioning, but with reduced EF noted on Myoview we are to reassess by echocardiogram.  Discussed importance of trying to get some exercise and dietary modification.      Relevant Orders   ECHOCARDIOGRAM COMPLETE    Other Visit Diagnoses    Abnormal nuclear stress test       Relevant Orders   ECHOCARDIOGRAM COMPLETE      Current medicines are reviewed at length with the patient today. (+/- concerns) n/a The following changes have been made: none  Patient Instructions  NO CHANGE IN CURRENT MEDICATIONS   SCHEDULE [place]  Your physician has requested that you have an echocardiogram. Echocardiography is a painless test that uses sound waves to create images  of your heart. It provides your doctor with information about the size and shape of your heart and how well your heart's chambers and valves are working. This procedure takes approximately one hour. There are no restrictions for this procedure.   IF NORMAL WILL SEE YOU IN June or July 2018 , abnormal will schedule cardiac catheterization,  Your physician wants you to follow-up in June or July  2018 with Dr Ellyn Hack.  You will receive a reminder letter in the mail two months in advance. If you don't receive a letter, please call our office to schedule the follow-up appointment.    If you need a refill on your cardiac medications before your next appointment, please call your pharmacy.    Studies Ordered:   Orders Placed This Encounter  Procedures  . ECHOCARDIOGRAM COMPLETE      Glenetta Hew, M.D., M.S. Interventional Cardiologist   Pager # [phone] Phone # [phone] [address] [place], [place] [address]

## 2016-11-20 NOTE — Patient Instructions (Addendum)
NO CHANGE IN CURRENT MEDICATIONS   SCHEDULE [place]  Your physician has requested that you have an echocardiogram. Echocardiography is a painless test that uses sound waves to create images of your heart. It provides your doctor with information about the size and shape of your heart and how well your heart's chambers and valves are working. This procedure takes approximately one hour. There are no restrictions for this procedure.   IF NORMAL WILL SEE YOU IN June or July 2018 , abnormal will schedule cardiac catheterization,  Your physician wants you to follow-up in June or July  2018 with Dr Ellyn Hack.  You will receive a reminder letter in the mail two months in advance. If you don't receive a letter, please call our office to schedule the follow-up appointment.    If you need a refill on your cardiac medications before your next appointment, please call your pharmacy.

## 2016-11-21 ENCOUNTER — Encounter: Payer: Self-pay | Admitting: Cardiology

## 2016-11-21 DIAGNOSIS — R0600 Dyspnea, unspecified: Secondary | ICD-10-CM | POA: Insufficient documentation

## 2016-11-21 DIAGNOSIS — R0609 Other forms of dyspnea: Secondary | ICD-10-CM

## 2016-11-21 DIAGNOSIS — I519 Heart disease, unspecified: Secondary | ICD-10-CM | POA: Insufficient documentation

## 2016-11-21 NOTE — Assessment & Plan Note (Signed)
Poorly controlled. Now atorvastatin. Need to monitor to see how well it comes down. May require additional therapy.

## 2016-11-21 NOTE — Assessment & Plan Note (Signed)
Not sure how to interpret the Myoview results of his EF. He doesn't really have heart failure symptoms, and there was a question of this EF and wall motion abnormality. We need to assess with a better test to evaluate for wall motion and ejection fraction. Plan: Check 2-D echocardiogram. -> If there is indeed a reduced ejection fraction with inferior hypokinesis, but I think we need to consider the fixed defect/diaphragmatic attenuation to be more consistent with infarct. In which case, we will consider an invasive ischemic evaluation.

## 2016-11-21 NOTE — Assessment & Plan Note (Signed)
He did have atypical symptoms for angina, but also has some exertional dyspnea. He does a cardio risk factors concerning for borderline metabolic syndrome. He has definitely dyslipidemia and diabetes.  His Myoview was not totally normal, the reading physician felt like the interpretation of the EF was probably underestimated. I would like to evaluate this with an echocardiogram, because if he truly does have a reduced EF, with and fixed defect I think we should probably consider ischemic evaluation with cardiac catheterization. However if his echo shows no regional wall motion abnormality, I think we can simply treat risk factors and monitor.

## 2016-11-21 NOTE — Assessment & Plan Note (Signed)
Probably most related to deconditioning, but with reduced EF noted on Myoview we are to reassess by echocardiogram.  Discussed importance of trying to get some exercise and dietary modification.

## 2016-11-21 NOTE — Assessment & Plan Note (Signed)
He has very poorly controlled lipids. Was started on atorvastatin recently. Will need to monitor to see how well it is being treated on current dose. He may need additional therapy. He also has very poorly controlled diabetes. These combination put him at high risk for CAD.  With resting heart rate of 97, low threshold for starting a beta blocker.

## 2016-11-27 ENCOUNTER — Other Ambulatory Visit: Payer: Self-pay | Admitting: Family Medicine

## 2016-11-27 NOTE — Telephone Encounter (Signed)
Refilled for 30 days. Patient has uncontrolled diabetes, last office visit was with Dr. Nolon Rod 08/2016. Plan was to recheck in 10/2016. Patient notified and agreed to come back for recheck. I will forward this to Dr. Nolon Rod for her review.

## 2016-12-05 ENCOUNTER — Ambulatory Visit (HOSPITAL_COMMUNITY): Payer: BLUE CROSS/BLUE SHIELD | Attending: Internal Medicine

## 2016-12-05 ENCOUNTER — Other Ambulatory Visit: Payer: Self-pay

## 2016-12-05 DIAGNOSIS — E785 Hyperlipidemia, unspecified: Secondary | ICD-10-CM | POA: Diagnosis not present

## 2016-12-05 DIAGNOSIS — E119 Type 2 diabetes mellitus without complications: Secondary | ICD-10-CM | POA: Insufficient documentation

## 2016-12-05 DIAGNOSIS — R9439 Abnormal result of other cardiovascular function study: Secondary | ICD-10-CM | POA: Diagnosis not present

## 2016-12-05 DIAGNOSIS — R0609 Other forms of dyspnea: Secondary | ICD-10-CM | POA: Diagnosis present

## 2016-12-05 DIAGNOSIS — Z8249 Family history of ischemic heart disease and other diseases of the circulatory system: Secondary | ICD-10-CM | POA: Diagnosis not present

## 2016-12-05 DIAGNOSIS — R079 Chest pain, unspecified: Secondary | ICD-10-CM | POA: Insufficient documentation

## 2016-12-05 MED ORDER — PERFLUTREN LIPID MICROSPHERE
1.0000 mL | INTRAVENOUS | Status: AC | PRN
  Administered 2016-12-05: 1 mL via INTRAVENOUS

## 2016-12-18 ENCOUNTER — Telehealth: Payer: Self-pay | Admitting: *Deleted

## 2016-12-18 NOTE — Telephone Encounter (Signed)
-----   Message from Leonie Man, MD sent at 12/06/2016  7:29 PM EST ----- Echocardiogram results: Technically difficult, but relatively normal study. EF essentially 55-60%. But unable to determine regional wall motion changes. Calcified aortic valve with sclerosis but no stenosis. Otherwise essentially normal echocardiogram. Unable to assess diastolic function.  Glenetta Hew, MD

## 2016-12-18 NOTE — Telephone Encounter (Signed)
Left message to call back - in regards to echo results  ( keep appointment in June/july 2018)

## 2016-12-18 NOTE — Telephone Encounter (Signed)
Spoke to patient. Result given . Verbalized understanding  

## 2016-12-26 ENCOUNTER — Ambulatory Visit: Payer: BLUE CROSS/BLUE SHIELD | Admitting: Family Medicine

## 2016-12-27 ENCOUNTER — Ambulatory Visit: Payer: BLUE CROSS/BLUE SHIELD | Admitting: Cardiology

## 2017-03-23 ENCOUNTER — Other Ambulatory Visit: Payer: Self-pay | Admitting: Family Medicine

## 2017-03-23 DIAGNOSIS — E782 Mixed hyperlipidemia: Secondary | ICD-10-CM

## 2017-03-25 NOTE — Telephone Encounter (Signed)
Lab Results  Component Value Date   HGBA1C 11.7 (H) 09/17/2016   Lab Results  Component Value Date   CREATININE 0.95 09/17/2016   Please notify the patient I can only refill one month of his medications because he has not been seen since November and his sugars were uncontrolled.  I would like him to return to clinic in the next month so that we can check his a1c and talk about his diabetes.

## 2017-05-10 ENCOUNTER — Other Ambulatory Visit: Payer: Self-pay | Admitting: Family Medicine

## 2017-05-14 NOTE — Telephone Encounter (Signed)
Lab Results  Component Value Date   HGBA1C 11.7 (H) 09/17/2016   Patient needs an office visit

## 2017-06-18 ENCOUNTER — Other Ambulatory Visit: Payer: Self-pay | Admitting: Family Medicine

## 2017-06-25 ENCOUNTER — Encounter: Payer: Self-pay | Admitting: Family Medicine

## 2017-06-25 ENCOUNTER — Ambulatory Visit (INDEPENDENT_AMBULATORY_CARE_PROVIDER_SITE_OTHER): Payer: BLUE CROSS/BLUE SHIELD | Admitting: Family Medicine

## 2017-06-25 VITALS — BP 112/76 | HR 97 | Temp 98.0°F | Resp 18 | Ht 69.0 in | Wt 192.8 lb

## 2017-06-25 DIAGNOSIS — E1165 Type 2 diabetes mellitus with hyperglycemia: Secondary | ICD-10-CM | POA: Diagnosis not present

## 2017-06-25 DIAGNOSIS — Z23 Encounter for immunization: Secondary | ICD-10-CM | POA: Diagnosis not present

## 2017-06-25 DIAGNOSIS — R5382 Chronic fatigue, unspecified: Secondary | ICD-10-CM

## 2017-06-25 DIAGNOSIS — Z5181 Encounter for therapeutic drug level monitoring: Secondary | ICD-10-CM | POA: Diagnosis not present

## 2017-06-25 DIAGNOSIS — G479 Sleep disorder, unspecified: Secondary | ICD-10-CM | POA: Diagnosis not present

## 2017-06-25 DIAGNOSIS — E785 Hyperlipidemia, unspecified: Secondary | ICD-10-CM | POA: Diagnosis not present

## 2017-06-25 DIAGNOSIS — R0683 Snoring: Secondary | ICD-10-CM | POA: Diagnosis not present

## 2017-06-25 LAB — POCT GLYCOSYLATED HEMOGLOBIN (HGB A1C): HEMOGLOBIN A1C: 8.1
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
## 2017-06-25 NOTE — Patient Instructions (Signed)
     IF you received an x-ray today, you will receive an invoice from [facility]. Please contact [facility] at [phone] with questions or concerns regarding your invoice.   IF you received labwork today, you will receive an invoice from [facility]. Please contact [facility] at [phone] with questions or concerns regarding your invoice.   Our billing staff will not be able to assist you with questions regarding bills from these companies.  You will be contacted with the lab results as soon as they are available. The fastest way to get your results is to activate your My Chart account. Instructions are located on the last page of this paperwork. If you have not heard from us regarding the results in 2 weeks, please contact this office.     

## 2017-06-26 ENCOUNTER — Telehealth: Payer: Self-pay | Admitting: Family Medicine

## 2017-06-26 LAB — COMPREHENSIVE METABOLIC PANEL
A/G RATIO: 1.6 (ref 1.2–2.2)
ALK PHOS: 43 IU/L (ref 39–117)
ALT: 21 IU/L (ref 0–44)
AST: 10 IU/L (ref 0–40)
Albumin: 4.5 g/dL (ref 3.5–5.5)
BUN / CREAT RATIO: 14 (ref 9–20)
BUN: 13 mg/dL (ref 6–24)
CHLORIDE: 98 mmol/L (ref 96–106)
CO2: 22 mmol/L (ref 20–29)
Calcium: 9.7 mg/dL (ref 8.7–10.2)
Creatinine, Ser: 0.92 mg/dL (ref 0.76–1.27)
GFR calc non Af Amer: 93 mL/min/{1.73_m2} (ref >59)
GFR, EST AFRICAN AMERICAN: 107 mL/min/{1.73_m2} (ref >59)
GLUCOSE: 126 mg/dL — AB (ref 65–99)
Globulin, Total: 2.9 g/dL (ref 1.5–4.5)
POTASSIUM: 4.4 mmol/L (ref 3.5–5.2)
Sodium: 136 mmol/L (ref 134–144)
Total Protein: 7.4 g/dL (ref 6.0–8.5)

## 2017-06-26 LAB — LIPID PANEL
CHOL/HDL RATIO: 5.4 ratio — AB (ref 0.0–5.0)
Cholesterol, Total: 294 mg/dL — ABNORMAL HIGH (ref 100–199)
HDL: 54 mg/dL (ref >39)
LDL Calculated: 175 mg/dL — ABNORMAL HIGH (ref 0–99)
Triglycerides: 324 mg/dL — ABNORMAL HIGH (ref 0–149)
VLDL CHOLESTEROL CAL: 65 mg/dL — AB (ref 5–40)

## 2017-06-26 LAB — MICROALBUMIN, URINE: Microalbumin, Urine: 8.8 ug/mL

## 2017-06-26 NOTE — Telephone Encounter (Signed)
PATIENT WAS IN THE OFFICE ON Tuesday (06/25/17) AND HE SAW DR. Nolon Rod. HE SAID TUESDAY AFTERNOON SOMEONE TRIED TO CALL HIM TWICE BUT HE WAS SLEEPING AND DID NOT ANSWER THE PHONE. HE SAID NO MESSAGE WAS LEFT EITHER TIME. HE IS RETURNING THE CALL. BEST PHONE [phone] (CELL) [place]

## 2017-07-18 ENCOUNTER — Other Ambulatory Visit: Payer: Self-pay | Admitting: Family Medicine

## 2017-07-18 NOTE — Telephone Encounter (Signed)
Refill request for metformin 1000 mg # 60 with no refill approved.

## 2017-09-05 ENCOUNTER — Other Ambulatory Visit: Payer: Self-pay | Admitting: Family Medicine

## 2017-09-23 ENCOUNTER — Other Ambulatory Visit: Payer: Self-pay

## 2017-09-23 ENCOUNTER — Encounter: Payer: Self-pay | Admitting: Family Medicine

## 2017-09-23 ENCOUNTER — Ambulatory Visit (INDEPENDENT_AMBULATORY_CARE_PROVIDER_SITE_OTHER): Payer: BLUE CROSS/BLUE SHIELD | Admitting: Family Medicine

## 2017-09-23 VITALS — BP 110/82 | HR 108 | Temp 98.3°F | Resp 18 | Ht 69.0 in | Wt 196.2 lb

## 2017-09-23 DIAGNOSIS — Z125 Encounter for screening for malignant neoplasm of prostate: Secondary | ICD-10-CM

## 2017-09-23 DIAGNOSIS — R399 Unspecified symptoms and signs involving the genitourinary system: Secondary | ICD-10-CM | POA: Diagnosis not present

## 2017-09-23 DIAGNOSIS — Z1159 Encounter for screening for other viral diseases: Secondary | ICD-10-CM | POA: Diagnosis not present

## 2017-09-23 DIAGNOSIS — Z114 Encounter for screening for human immunodeficiency virus [HIV]: Secondary | ICD-10-CM | POA: Diagnosis not present

## 2017-09-23 DIAGNOSIS — Z0000 Encounter for general adult medical examination without abnormal findings: Secondary | ICD-10-CM

## 2017-09-23 DIAGNOSIS — E119 Type 2 diabetes mellitus without complications: Secondary | ICD-10-CM | POA: Diagnosis not present

## 2017-09-23 DIAGNOSIS — E782 Mixed hyperlipidemia: Secondary | ICD-10-CM

## 2017-09-23 LAB — POCT URINALYSIS DIP (MANUAL ENTRY)
BILIRUBIN UA: NEGATIVE
Blood, UA: NEGATIVE
GLUCOSE UA: NEGATIVE mg/dL
Ketones, POC UA: NEGATIVE mg/dL
LEUKOCYTES UA: NEGATIVE
NITRITE UA: NEGATIVE
PH UA: 6 (ref 5.0–8.0)
Protein Ur, POC: NEGATIVE mg/dL
Spec Grav, UA: 1.015 (ref 1.010–1.025)
Urobilinogen, UA: 0.2 E.U./dL

## 2017-09-23 MED ORDER — TADALAFIL 10 MG PO TABS: 5.0000 mg | ORAL_TABLET | Freq: Every day | ORAL | 6 refills | Status: DC | PRN

## 2017-09-23 NOTE — Progress Notes (Signed)
Chief Complaint  Patient presents with  . Annual Exam  . Medication Refill    Metformin 1000 MG    Subjective:  Wesley Davila is a 56 y.o. male here for a health maintenance visit.  Patient is established pt   Patient Active Problem List   Diagnosis Date Noted  . Left ventricular dysfunction 11/21/2016  . DOE (dyspnea on exertion) 11/21/2016  . Chest pain with moderate risk for cardiac etiology 10/17/2016  . Type 2 diabetes mellitus without complication, without long-term current use of insulin ([place]) 10/17/2016  . Hyperlipidemia LDL goal <70 10/17/2016  . Family history of cardiovascular disease 10/17/2016    Past Medical History:  Diagnosis Date  . Diabetes mellitus without complication ([place])   . Hyperlipidemia     Past Surgical History:  Procedure Laterality Date  . NM MYOVIEW LTD  09/2016   Fixed small, mild basal-mid inferior perfusion defect likely diaphragmatic attenuation. No evidence of ischemia. EF 44% with diffuse hypokinesis per computer read, but visually it would appear to be better. Considered intermediate risk.     Outpatient Medications Prior to Visit  Medication Sig Dispense Refill  . glucose blood (COOL BLOOD GLUCOSE TEST STRIPS) test strip Use as instructed 100 each 12  . metFORMIN (GLUCOPHAGE) 1000 MG tablet TAKE 1 TABLET BY MOUTH TWICE DAILY WITH A MEALS 60 tablet 0  . atorvastatin (LIPITOR) 40 MG tablet TAKE 1 TABLET(40 MG) BY MOUTH DAILY (Patient not taking: Reported on 09/23/2017) 90 tablet 0  . Omega-3 1000 MG CAPS Take 1 capsule (1,000 mg total) by mouth daily. (Patient not taking: Reported on 09/23/2017) 30 capsule 11  . predniSONE (STERAPRED UNI-PAK 21 TAB) 10 MG (21) TBPK tablet Take 1 tablet (10 mg total) by mouth daily. Take 6 tabs by mouth daily  for 2 days, then 5 tabs for 2 days, then 4 tabs for 2 days, then 3 tabs for 2 days, 2 tabs for 2 days, then 1 tab by mouth daily for 2 days (Patient not taking: Reported on 06/25/2017) 42 tablet 0    No facility-administered medications prior to visit.     No Known Allergies   Family History  Problem Relation Age of Onset  . Heart disease Father 31       MI x 2  . Stroke Father   . Hypertension Brother   . Diabetes Brother   . Hypertension Brother   . Diabetes Brother   . Colon cancer Neg Hx      Health Habits: Dental Exam: up to date Eye Exam: up to date Exercise: 0 times/week on average Current exercise activities: walking/running Diet: balanced  Social History   Socioeconomic History  . Marital status: Married    Spouse name: Not on file  . Number of children: 3  . Years of education: Not on file  . Highest education level: Not on file  Social Needs  . Financial resource strain: Not on file  . Food insecurity - worry: Not on file  . Food insecurity - inability: Not on file  . Transportation needs - medical: Not on file  . Transportation needs - non-medical: Not on file  Occupational History  . Occupation: Agricultural consultant    Comment: Secondary school teacher  Tobacco Use  . Smoking status: Never Smoker  . Smokeless tobacco: Current User    Types: Snuff  . Tobacco comment: since 1980   Substance and Sexual Activity  . Alcohol use: No  . Drug use: No  . Sexual activity:  Not on file  Other Topics Concern  . Not on file  Social History Narrative   Has 6 brothers, 6 sisters - 2 Brothers with with DM & HTN       Social History   Substance and Sexual Activity  Alcohol Use No   Social History   Tobacco Use  Smoking Status Never Smoker  Smokeless Tobacco Current User  . Types: Snuff  Tobacco Comment   since 1980    Social History   Substance and Sexual Activity  Drug Use No    GYN: Sexual Health Sexually active:  with male partner   Health Maintenance: See under health Maintenance activity for review of completion dates as well. Immunization History  Administered Date(s) Administered  . Influenza-Unspecified 07/29/2017  . Pneumococcal  Conjugate-13 10/11/2015  . Pneumococcal Polysaccharide-23 06/25/2017  . Tdap 10/11/2015      Depression Screen-PHQ2/9 Depression screen [facility] 2/9 09/23/2017 06/25/2017 09/17/2016 10/11/2015  Decreased Interest 0 0 0 0  Down, Depressed, Hopeless 0 0 0 0  PHQ - 2 Score 0 0 0 0      Depression Severity and Treatment Recommendations:  0-4= None  5-9= Mild / Treatment: Support, educate to call if worse; return in one month  10-14= Moderate / Treatment: Support, watchful waiting; Antidepressant or Psycotherapy  15-19= Moderately severe / Treatment: Antidepressant OR Psychotherapy  >= 20 = Major depression, severe / Antidepressant AND Psychotherapy    Review of Systems   Review of Systems  Constitutional: Negative for chills, fever and weight loss.  Respiratory: Negative for cough, shortness of breath and wheezing.   Cardiovascular: Negative for chest pain and palpitations.  Gastrointestinal: Negative for abdominal pain, diarrhea, nausea and vomiting.  Genitourinary: Positive for frequency and urgency. Negative for dysuria, flank pain and hematuria.       Also has erectile dysfunction. Also has urinary hesitation  Skin: Negative for itching and rash.  Neurological: Negative for dizziness, tingling, tremors and headaches.    See HPI for ROS as well.    Objective:   Vitals:   09/23/17 1340  BP: 110/82  Pulse: (!) 108  Resp: 18  Temp: 98.3 F (36.8 C)  TempSrc: Oral  SpO2: 99%  Weight: 196 lb 3.2 oz (89 kg)  Height: 5\' 9"  (7.106 m)    Body mass index is 28.97 kg/m.  Diabetic Foot Exam - Simple   Simple Foot Form Diabetic Foot exam was performed with the following findings:  Yes 09/23/2017  1:43 PM  Visual Inspection No deformities, no ulcerations, no other skin breakdown bilaterally:  Yes Sensation Testing Intact to touch and monofilament testing bilaterally:  Yes Pulse Check Posterior Tibialis and Dorsalis pulse intact bilaterally:  Yes Comments     Physical  Exam  Constitutional: He is oriented to person, place, and time. He appears well-developed and well-nourished.  HENT:  Head: Normocephalic and atraumatic.  Eyes: Conjunctivae and EOM are normal. Pupils are equal, round, and reactive to light.  Neck: Normal range of motion. No thyromegaly present.  Cardiovascular: Normal rate, regular rhythm, normal heart sounds and intact distal pulses.  No murmur heard. Pulmonary/Chest: Effort normal and breath sounds normal. No respiratory distress. He has no wheezes. He has no rales.  Abdominal: Soft. Bowel sounds are normal. He exhibits no distension. There is no tenderness. There is no rebound.  Genitourinary: Rectum normal. Prostate is enlarged. Prostate is not tender.  Genitourinary Comments: Chaperone present  Musculoskeletal: Normal range of motion. He exhibits no edema or tenderness.  Neurological: He is alert and oriented to person, place, and time. He has normal reflexes. No cranial nerve deficit.  Skin: Skin is warm. No rash noted. No erythema.  Psychiatric: He has a normal mood and affect. His behavior is normal. Judgment and thought content normal.      Assessment/Plan:   Patient was seen for a health maintenance exam.  Counseled the patient on health maintenance issues. Reviewed her health mainteance schedule and ordered appropriate tests (see orders.) Counseled on regular exercise and weight management. Recommend regular eye exams and dental cleaning.   The following issues were addressed today for health maintenance:   Wesley Davila was seen today for annual exam and medication refill.  Diagnoses and all orders for this visit:  Annual physical exam- discussed age appropriate screenings, including cancer screening and monitoring for chronic disease with the goal to reduce the risk of heart disease and stroke  Type 2 diabetes mellitus without complication, without long-term current use of insulin ([place])- discussed compliance and  management Also discussed diabetes and vascular disease causing ED -     Lipid panel -     Comprehensive metabolic panel -     Hemoglobin A1c  Mixed hyperlipidemia -     Lipid panel -     Comprehensive metabolic panel  Screening for prostate cancer -     PSA  Need for hepatitis C screening test -     HCV Ab w/Rflx to Verification  Screening for HIV (human immunodeficiency virus) -     HIV antibody  Lower urinary tract symptoms (LUTS)- will check PSA Slightly enlarged prostate Concerning for BPH -     POCT urinalysis dipstick  Other orders -     Cancel: Flu Vaccine QUAD 36+ mos IM -     tadalafil (CIALIS) 10 MG tablet; Take 0.5 tablets (5 mg total) by mouth daily as needed for erectile dysfunction.    Return in about 4 weeks (around 10/21/2017) for right knee pain with xray.    Body mass index is 28.97 kg/m.:  Discussed the patient's BMI with patient. The BMI body mass index is 28.97 kg/m.     No future appointments.  Patient Instructions   Contact your doctor or health care professional right away if the erection lasts longer than 4 hours or if it becomes painful. This may be a sign of serious problem and must be treated right away to prevent permanent damage.  IF you received an x-ray today, you will receive an invoice from [facility]. Please contact [facility] at [phone] with questions or concerns regarding your invoice.   IF you received labwork today, you will receive an invoice from [facility]. Please contact [facility] at [phone] with questions or concerns regarding your invoice.   Our billing staff will not be able to assist you with questions regarding bills from these companies.  You will be contacted with the lab results as soon as they are available. The fastest way to get your results is to activate your My Chart account. Instructions are located on the last page of this paperwork. If you have not heard from [place] regarding the  results in 2 weeks, please contact this office.    Tadalafil tablets (Cialis) What is this medicine? TADALAFIL (tah DA la fil) is used to treat erection problems in men. It is also used for enlargement of the prostate gland in men, a condition called benign prostatic hyperplasia or BPH. This medicine improves urine flow and reduces BPH symptoms.  This medicine can also treat both erection problems and BPH when they occur together. This medicine may be used for other purposes; ask your health care provider or pharmacist if you have questions. COMMON BRAND NAME(S): Cialis What should I tell my health care provider before I take this medicine? They need to know if you have any of these conditions: -bleeding disorders -eye or vision problems, including a rare inherited eye disease called retinitis pigmentosa -anatomical deformation of the penis, Peyronie's disease, or history of priapism (painful and prolonged erection) -heart disease, angina, a history of heart attack, irregular heart beats, or other heart problems -high or low blood pressure -history of blood diseases, like sickle cell anemia or leukemia -history of stomach bleeding -kidney disease -liver disease -stroke -an unusual or allergic reaction to tadalafil, other medicines, foods, dyes, or preservatives -pregnant or trying to get pregnant -breast-feeding How should I use this medicine? Take this medicine by mouth with a glass of water. Follow the directions on the prescription label. You may take this medicine with or without meals. When this medicine is used for erection problems, your doctor may prescribe it to be taken once daily or as needed. If you are taking the medicine as needed, you may be able to have sexual activity 30 minutes after taking it and for up to 36 hours after taking it. Whether you are taking the medicine as needed or once daily, you should not take more than one dose per day. If you are taking this medicine for  symptoms of benign prostatic hyperplasia (BPH) or to treat both BPH and an erection problem, take the dose once daily at about the same time each day. Do not take your medicine more often than directed. Talk to your pediatrician regarding the use of this medicine in children. Special care may be needed. Overdosage: If you think you have taken too much of this medicine contact a poison control center or emergency room at once. NOTE: This medicine is only for you. Do not share this medicine with others. What if I miss a dose? If you are taking this medicine as needed for erection problems, this does not apply. If you miss a dose while taking this medicine once daily for an erection problem, benign prostatic hyperplasia, or both, take it as soon as you remember, but do not take more than one dose per day. What may interact with this medicine? Do not take this medicine with any of the following medications: -nitrates like amyl nitrite, isosorbide dinitrate, isosorbide mononitrate, nitroglycerin -other medicines for erectile dysfunction like avanafil, sildenafil, vardenafil -other tadalafil products (Adcirca) -riociguat This medicine may also interact with the following medications: -certain drugs for high blood pressure -certain drugs for the treatment of HIV infection or AIDS -certain drugs used for fungal or yeast infections, like fluconazole, itraconazole, ketoconazole, and voriconazole -certain drugs used for seizures like carbamazepine, phenytoin, and phenobarbital -grapefruit juice -macrolide antibiotics like clarithromycin, erythromycin, troleandomycin -medicines for prostate problems -rifabutin, rifampin or rifapentine This list may not describe all possible interactions. Give your health care provider a list of all the medicines, herbs, non-prescription drugs, or dietary supplements you use. Also tell them if you smoke, drink alcohol, or use illegal drugs. Some items may interact with your  medicine. What should I watch for while using this medicine? If you notice any changes in your vision while taking this drug, call your doctor or health care professional as soon as possible. Stop using this medicine and call your  health care provider right away if you have a loss of sight in one or both eyes. Contact your doctor or health care professional right away if the erection lasts longer than 4 hours or if it becomes painful. This may be a sign of serious problem and must be treated right away to prevent permanent damage. If you experience symptoms of nausea, dizziness, chest pain or arm pain upon initiation of sexual activity after taking this medicine, you should refrain from further activity and call your doctor or health care professional as soon as possible. Do not drink alcohol to excess (examples, 5 glasses of wine or 5 shots of whiskey) when taking this medicine. When taken in excess, alcohol can increase your chances of getting a headache or getting dizzy, increasing your heart rate or lowering your blood pressure. Using this medicine does not protect you or your partner against HIV infection (the virus that causes AIDS) or other sexually transmitted diseases. What side effects may I notice from receiving this medicine? Side effects that you should report to your doctor or health care professional as soon as possible: -allergic reactions like skin rash, itching or hives, swelling of the face, lips, or tongue -breathing problems -changes in hearing -changes in vision -chest pain -fast, irregular heartbeat -prolonged or painful erection -seizures Side effects that usually do not require medical attention (report to your doctor or health care professional if they continue or are bothersome): -back pain -dizziness -flushing -headache -indigestion -muscle aches -nausea -stuffy or runny nose This list may not describe all possible side effects. Call your doctor for medical  advice about side effects. You may report side effects to FDA at 1-800-FDA-1088. Where should I keep my medicine? Keep out of the reach of children. Store at room temperature between 15 and 30 degrees C (59 and 86 degrees F). Throw away any unused medicine after the expiration date. NOTE: This sheet is a summary. It may not cover all possible information. If you have questions about this medicine, talk to your doctor, pharmacist, or health care provider.  2018 Elsevier/Gold Standard (2014-03-05 13:15:49)

## 2017-09-23 NOTE — Patient Instructions (Addendum)
Contact your doctor or health care professional right away if the erection lasts longer than 4 hours or if it becomes painful. This may be a sign of serious problem and must be treated right away to prevent permanent damage.  IF you received an x-ray today, you will receive an invoice from [facility]. Please contact [facility] at [phone] with questions or concerns regarding your invoice.   IF you received labwork today, you will receive an invoice from [place]. Please contact [facility] at [phone] with questions or concerns regarding your invoice.   Our billing staff will not be able to assist you with questions regarding bills from these companies.  You will be contacted with the lab results as soon as they are available. The fastest way to get your results is to activate your My Chart account. Instructions are located on the last page of this paperwork. If you have not heard from [place] regarding the results in 2 weeks, please contact this office.    Tadalafil tablets (Cialis) What is this medicine? TADALAFIL (tah DA la fil) is used to treat erection problems in men. It is also used for enlargement of the prostate gland in men, a condition called benign prostatic hyperplasia or BPH. This medicine improves urine flow and reduces BPH symptoms. This medicine can also treat both erection problems and BPH when they occur together. This medicine may be used for other purposes; ask your health care provider or pharmacist if you have questions. COMMON BRAND NAME(S): Cialis What should I tell my health care provider before I take this medicine? They need to know if you have any of these conditions: -bleeding disorders -eye or vision problems, including a rare inherited eye disease called retinitis pigmentosa -anatomical deformation of the penis, Peyronie's disease, or history of priapism (painful and prolonged erection) -heart disease, angina, a history of heart attack,  irregular heart beats, or other heart problems -high or low blood pressure -history of blood diseases, like sickle cell anemia or leukemia -history of stomach bleeding -kidney disease -liver disease -stroke -an unusual or allergic reaction to tadalafil, other medicines, foods, dyes, or preservatives -pregnant or trying to get pregnant -breast-feeding How should I use this medicine? Take this medicine by mouth with a glass of water. Follow the directions on the prescription label. You may take this medicine with or without meals. When this medicine is used for erection problems, your doctor may prescribe it to be taken once daily or as needed. If you are taking the medicine as needed, you may be able to have sexual activity 30 minutes after taking it and for up to 36 hours after taking it. Whether you are taking the medicine as needed or once daily, you should not take more than one dose per day. If you are taking this medicine for symptoms of benign prostatic hyperplasia (BPH) or to treat both BPH and an erection problem, take the dose once daily at about the same time each day. Do not take your medicine more often than directed. Talk to your pediatrician regarding the use of this medicine in children. Special care may be needed. Overdosage: If you think you have taken too much of this medicine contact a poison control center or emergency room at once. NOTE: This medicine is only for you. Do not share this medicine with others. What if I miss a dose? If you are taking this medicine as needed for erection problems, this does not apply. If you miss a dose while taking  this medicine once daily for an erection problem, benign prostatic hyperplasia, or both, take it as soon as you remember, but do not take more than one dose per day. What may interact with this medicine? Do not take this medicine with any of the following medications: -nitrates like amyl nitrite, isosorbide dinitrate, isosorbide  mononitrate, nitroglycerin -other medicines for erectile dysfunction like avanafil, sildenafil, vardenafil -other tadalafil products (Adcirca) -riociguat This medicine may also interact with the following medications: -certain drugs for high blood pressure -certain drugs for the treatment of HIV infection or AIDS -certain drugs used for fungal or yeast infections, like fluconazole, itraconazole, ketoconazole, and voriconazole -certain drugs used for seizures like carbamazepine, phenytoin, and phenobarbital -grapefruit juice -macrolide antibiotics like clarithromycin, erythromycin, troleandomycin -medicines for prostate problems -rifabutin, rifampin or rifapentine This list may not describe all possible interactions. Give your health care provider a list of all the medicines, herbs, non-prescription drugs, or dietary supplements you use. Also tell them if you smoke, drink alcohol, or use illegal drugs. Some items may interact with your medicine. What should I watch for while using this medicine? If you notice any changes in your vision while taking this drug, call your doctor or health care professional as soon as possible. Stop using this medicine and call your health care provider right away if you have a loss of sight in one or both eyes. Contact your doctor or health care professional right away if the erection lasts longer than 4 hours or if it becomes painful. This may be a sign of serious problem and must be treated right away to prevent permanent damage. If you experience symptoms of nausea, dizziness, chest pain or arm pain upon initiation of sexual activity after taking this medicine, you should refrain from further activity and call your doctor or health care professional as soon as possible. Do not drink alcohol to excess (examples, 5 glasses of wine or 5 shots of whiskey) when taking this medicine. When taken in excess, alcohol can increase your chances of getting a headache or getting  dizzy, increasing your heart rate or lowering your blood pressure. Using this medicine does not protect you or your partner against HIV infection (the virus that causes AIDS) or other sexually transmitted diseases. What side effects may I notice from receiving this medicine? Side effects that you should report to your doctor or health care professional as soon as possible: -allergic reactions like skin rash, itching or hives, swelling of the face, lips, or tongue -breathing problems -changes in hearing -changes in vision -chest pain -fast, irregular heartbeat -prolonged or painful erection -seizures Side effects that usually do not require medical attention (report to your doctor or health care professional if they continue or are bothersome): -back pain -dizziness -flushing -headache -indigestion -muscle aches -nausea -stuffy or runny nose This list may not describe all possible side effects. Call your doctor for medical advice about side effects. You may report side effects to FDA at 1-800-FDA-1088. Where should I keep my medicine? Keep out of the reach of children. Store at room temperature between 15 and 30 degrees C (59 and 86 degrees F). Throw away any unused medicine after the expiration date. NOTE: This sheet is a summary. It may not cover all possible information. If you have questions about this medicine, talk to your doctor, pharmacist, or health care provider.  2018 Elsevier/Gold Standard (2014-03-05 13:15:49)

## 2017-09-24 LAB — LIPID PANEL
CHOLESTEROL TOTAL: 299 mg/dL — AB (ref 100–199)
Chol/HDL Ratio: 5 ratio (ref 0.0–5.0)
HDL: 60 mg/dL (ref >39)
LDL CALC: 211 mg/dL — AB (ref 0–99)
Triglycerides: 139 mg/dL (ref 0–149)
VLDL CHOLESTEROL CAL: 28 mg/dL (ref 5–40)

## 2017-09-24 LAB — COMPREHENSIVE METABOLIC PANEL
ALBUMIN: 4.6 g/dL (ref 3.5–5.5)
ALK PHOS: 41 IU/L (ref 39–117)
ALT: 14 IU/L (ref 0–44)
AST: 14 IU/L (ref 0–40)
Albumin/Globulin Ratio: 1.6 (ref 1.2–2.2)
BUN/Creatinine Ratio: 17 (ref 9–20)
BUN: 14 mg/dL (ref 6–24)
Bilirubin Total: 0.2 mg/dL (ref 0.0–1.2)
CALCIUM: 9.3 mg/dL (ref 8.7–10.2)
CO2: 22 mmol/L (ref 20–29)
CREATININE: 0.82 mg/dL (ref 0.76–1.27)
Chloride: 102 mmol/L (ref 96–106)
GFR calc Af Amer: 113 mL/min/{1.73_m2} (ref >59)
GFR, EST NON AFRICAN AMERICAN: 98 mL/min/{1.73_m2} (ref >59)
GLUCOSE: 165 mg/dL — AB (ref 65–99)
Globulin, Total: 2.8 g/dL (ref 1.5–4.5)
Potassium: 4 mmol/L (ref 3.5–5.2)
Sodium: 139 mmol/L (ref 134–144)
Total Protein: 7.4 g/dL (ref 6.0–8.5)

## 2017-09-24 LAB — PSA: Prostate Specific Ag, Serum: 0.5 ng/mL (ref 0.0–4.0)

## 2017-09-24 LAB — HCV AB W/RFLX TO VERIFICATION

## 2017-09-24 LAB — HCV INTERPRETATION

## 2017-09-24 LAB — HIV ANTIBODY (ROUTINE TESTING W REFLEX): HIV Screen 4th Generation wRfx: NONREACTIVE

## 2017-09-24 LAB — HEMOGLOBIN A1C
ESTIMATED AVERAGE GLUCOSE: 186 mg/dL
HEMOGLOBIN A1C: 8.1 % — AB (ref 4.8–5.6)

## 2017-09-27 ENCOUNTER — Other Ambulatory Visit: Payer: Self-pay | Admitting: Family Medicine

## 2017-09-30 MED ORDER — METFORMIN HCL 1000 MG PO TABS: 1000.0000 mg | ORAL_TABLET | Freq: Two times a day (BID) | ORAL | 0 refills | Status: DC

## 2017-10-15 ENCOUNTER — Telehealth: Payer: Self-pay | Admitting: Family Medicine

## 2017-10-15 NOTE — Telephone Encounter (Signed)
Called pt. To reschedule his appt. With [facility] on 10/23/17. Nolon Rod will not be in the office that day. If patient calls back, please reschedule him for any day other than 10/23/17 or 10/28/17.  Thank you!

## 2017-10-23 ENCOUNTER — Ambulatory Visit: Payer: BLUE CROSS/BLUE SHIELD | Admitting: Family Medicine

## 2017-10-31 ENCOUNTER — Ambulatory Visit (INDEPENDENT_AMBULATORY_CARE_PROVIDER_SITE_OTHER): Payer: BLUE CROSS/BLUE SHIELD | Admitting: Family Medicine

## 2017-10-31 ENCOUNTER — Encounter: Payer: Self-pay | Admitting: Family Medicine

## 2017-10-31 ENCOUNTER — Other Ambulatory Visit: Payer: Self-pay

## 2017-10-31 VITALS — BP 108/70 | HR 98 | Temp 98.1°F | Resp 16 | Ht 69.0 in | Wt 192.4 lb

## 2017-10-31 DIAGNOSIS — K3184 Gastroparesis: Secondary | ICD-10-CM

## 2017-10-31 DIAGNOSIS — R140 Abdominal distension (gaseous): Secondary | ICD-10-CM

## 2017-10-31 DIAGNOSIS — R6881 Early satiety: Secondary | ICD-10-CM

## 2017-10-31 DIAGNOSIS — E119 Type 2 diabetes mellitus without complications: Secondary | ICD-10-CM

## 2017-10-31 DIAGNOSIS — Z5181 Encounter for therapeutic drug level monitoring: Secondary | ICD-10-CM

## 2017-10-31 DIAGNOSIS — R195 Other fecal abnormalities: Secondary | ICD-10-CM

## 2017-10-31 LAB — POCT GLYCOSYLATED HEMOGLOBIN (HGB A1C): Hemoglobin A1C: 10

## 2017-10-31 MED ORDER — GLYBURIDE 5 MG PO TABS: 5.0000 mg | ORAL_TABLET | Freq: Every day | ORAL | 3 refills | Status: DC

## 2017-10-31 NOTE — Patient Instructions (Addendum)
   IF you received an x-ray today, you will receive an invoice from [facility]. Please contact  Radiology at [phone] with questions or concerns regarding your invoice.   IF you received labwork today, you will receive an invoice from [facility]. Please contact [facility] at [phone] with questions or concerns regarding your invoice.   Our billing staff will not be able to assist you with questions regarding bills from these companies.  You will be contacted with the lab results as soon as they are available. The fastest way to get your results is to activate your My Chart account. Instructions are located on the last page of this paperwork. If you have not heard from us regarding the results in 2 weeks, please contact this office.      Gastroparesis Gastroparesis, also called delayed gastric emptying, is a condition in which food takes longer than normal to empty from the stomach. The condition is usually long-lasting (chronic). What are the causes? This condition may be caused by:  An endocrine disorder, such as hypothyroidism or diabetes. Diabetes is the most common cause of this condition.  A nervous system disease, such as Parkinson disease or multiple sclerosis.  Cancer, infection, or surgery of the stomach or vagus nerve.  A connective tissue disorder, such as scleroderma.  Certain medicines.  In most cases, the cause is not known. What increases the risk? This condition is more likely to develop in:  People with certain disorders, including endocrine disorders, eating disorders, amyloidosis, and scleroderma.  People with certain diseases, including Parkinson disease or multiple sclerosis.  People with cancer or infection of the stomach or vagus nerve.  People who have had surgery on the stomach or vagus nerve.  People who take certain medicines.  Women.  What are the signs or symptoms? Symptoms of this condition include:  An early  feeling of fullness when eating.  Nausea.  Weight loss.  Vomiting.  Heartburn.  Abdominal bloating.  Inconsistent blood glucose levels.  Lack of appetite.  Acid from the stomach coming up into the esophagus (gastroesophageal reflux).  Spasms of the stomach.  Symptoms may come and go. How is this diagnosed? This condition is diagnosed with tests, such as:  Tests that check how long it takes food to move through the stomach and intestines. These tests include: ? Upper gastrointestinal (GI) series. In this test, X-rays of the intestines are taken after you drink a liquid. The liquid makes the intestines show up better on the X-rays. ? Gastric emptying scintigraphy. In this test, scans are taken after you eat food that contains a small amount of radioactive material. ? Wireless capsule GI monitoring system. This test involves swallowing a capsule that records information about movement through the stomach.  Gastric manometry. This test measures electrical and muscular activity in the stomach. It is done with a thin tube that is passed down the throat and into the stomach.  Endoscopy. This test checks for abnormalities in the lining of the stomach. It is done with a long, thin tube that is passed down the throat and into the stomach.  An ultrasound. This test can help rule out gallbladder disease or pancreatitis as a cause of your symptoms. It uses sound waves to take pictures of the inside of your body.  How is this treated? There is no cure for gastroparesis. This condition may be managed with:  Treatment of the underlying condition causing the gastroparesis.  Lifestyle changes, including exercise and dietary changes. Dietary changes   can include: ? Changes in what and when you eat. ? Eating smaller meals more often. ? Eating low-fat foods. ? Eating low-fiber forms of high-fiber foods, such as cooked vegetables instead of raw vegetables. ? Having liquid foods in place of  solid foods. Liquid foods are easier to digest.  Medicines. These may be given to control nausea and vomiting and to stimulate stomach muscles.  Getting food through a feeding tube. This may be done in severe cases.  A gastric neurostimulator. This is a device that is inserted into the body with surgery. It helps improve stomach emptying and control nausea and vomiting.  Follow these instructions at home:  Follow your health care provider's instructions about exercise and diet.  Take medicines only as directed by your health care provider. Contact a health care provider if:  Your symptoms do not improve with treatment.  You have new symptoms. Get help right away if:  You have severe abdominal pain that does not improve with treatment.  You have nausea that does not go away.  You cannot keep fluids down. This information is not intended to replace advice given to you by your health care provider. Make sure you discuss any questions you have with your health care provider. Document Released: 10/15/2005 Document Revised: 03/22/2016 Document Reviewed: 10/11/2014 Elsevier Interactive Patient Education  2018 Elsevier Inc.  

## 2017-10-31 NOTE — Progress Notes (Signed)
Chief Complaint  Patient presents with  . 4 week f/u    pt c/o weakness/numbness in right hand x 3-4 weeks, diarrhea intermittent x couple weeks, per pt right after eating he has to go to br and bm's are watery, and eats only a small amount and he is full and pt concerned    HPI   He reports that every time he eats he gets loose watery stools Pt reports that he has reduced appetite and gets satiety  He reports that for 4-5 weeks he has had this sensation of fullness with watery stools  He has been losing weight as well and does not feel like he can eat much  Wt Readings from Last 3 Encounters:  10/31/17 192 lb 6.4 oz (87.3 kg)  09/23/17 196 lb 3.2 oz (89 kg)  06/25/17 192 lb 12.8 oz (87.5 kg)   Lab Results  Component Value Date   HGBA1C 10 10/31/2017    He has been getting numbness and weakness in the right hand The numbness is worse in the 4th and 5th digits of the right hand He is right handed He reports reduced grip strength He gets some pin and needles in the finger tips   Past Medical History:  Diagnosis Date  . Diabetes mellitus without complication ([place])   . Hyperlipidemia     Current Outpatient Medications  Medication Sig Dispense Refill  . atorvastatin (LIPITOR) 40 MG tablet TAKE 1 TABLET(40 MG) BY MOUTH DAILY 90 tablet 0  . glucose blood (COOL BLOOD GLUCOSE TEST STRIPS) test strip Use as instructed 100 each 12  . metFORMIN (GLUCOPHAGE) 1000 MG tablet Take 1 tablet (1,000 mg total) by mouth 2 (two) times daily with a meal. 60 tablet 0  . Omega-3 1000 MG CAPS Take 1 capsule (1,000 mg total) by mouth daily. 30 capsule 11  . predniSONE (STERAPRED UNI-PAK 21 TAB) 10 MG (21) TBPK tablet Take 1 tablet (10 mg total) by mouth daily. Take 6 tabs by mouth daily  for 2 days, then 5 tabs for 2 days, then 4 tabs for 2 days, then 3 tabs for 2 days, 2 tabs for 2 days, then 1 tab by mouth daily for 2 days 42 tablet 0  . tadalafil (CIALIS) 10 MG tablet Take 0.5 tablets (5 mg  total) by mouth daily as needed for erectile dysfunction. 10 tablet 6  . glyBURIDE (DIABETA) 5 MG tablet Take 1 tablet (5 mg total) by mouth daily with breakfast. 30 tablet 3   No current facility-administered medications for this visit.     Allergies: No Known Allergies  Past Surgical History:  Procedure Laterality Date  . NM MYOVIEW LTD  09/2016   Fixed small, mild basal-mid inferior perfusion defect likely diaphragmatic attenuation. No evidence of ischemia. EF 44% with diffuse hypokinesis per computer read, but visually it would appear to be better. Considered intermediate risk.    Social History   Socioeconomic History  . Marital status: Married    Spouse name: None  . Number of children: 3  . Years of education: None  . Highest education level: None  Social Needs  . Financial resource strain: None  . Food insecurity - worry: None  . Food insecurity - inability: None  . Transportation needs - medical: None  . Transportation needs - non-medical: None  Occupational History  . Occupation: Agricultural consultant    Comment: Secondary school teacher  Tobacco Use  . Smoking status: Never Smoker  . Smokeless tobacco: Current User  Types: Snuff  . Tobacco comment: since 1980   Substance and Sexual Activity  . Alcohol use: No  . Drug use: No  . Sexual activity: None  Other Topics Concern  . None  Social History Narrative   Has 6 brothers, 6 sisters - 2 Brothers with with DM & HTN        Family History  Problem Relation Age of Onset  . Heart disease Father 37       MI x 2  . Stroke Father   . Hypertension Brother   . Diabetes Brother   . Hypertension Brother   . Diabetes Brother   . Colon cancer Neg Hx      ROS Review of Systems See HPI Constitution: No fevers or chills No malaise No diaphoresis Skin: No rash or itching Eyes: no blurry vision, no double vision GU: no dysuria or hematuria Neuro: no dizziness or headaches  all others reviewed and negative    Objective: Vitals:   10/31/17 1550  BP: 108/70  Pulse: 98  Resp: 16  Temp: 98.1 F (36.7 C)  SpO2: 98%  Weight: 192 lb 6.4 oz (87.3 kg)  Height: 5\' 9"  (1.753 m)    Physical Exam  Constitutional: He is oriented to person, place, and time. He appears well-developed and well-nourished.  HENT:  Head: Normocephalic and atraumatic.  Eyes: Conjunctivae and EOM are normal.  Neck: Normal range of motion. Neck supple.  Cardiovascular: Normal rate, regular rhythm and normal heart sounds.  No murmur heard. Pulmonary/Chest: Effort normal and breath sounds normal. No stridor. No respiratory distress. He has no wheezes.  Abdominal: Soft. Bowel sounds are normal. He exhibits distension. He exhibits no mass. There is no tenderness. There is no rebound and no guarding. No hernia.  Neurological: He is alert and oriented to person, place, and time.  Skin: Skin is warm. Capillary refill takes less than 2 seconds.  Psychiatric: He has a normal mood and affect. His behavior is normal. Judgment and thought content normal.    Assessment and Plan Wesley Davila was seen today for 4 week f/u.  Diagnoses and all orders for this visit:  Type 2 diabetes mellitus without complication, without long-term current use of insulin (HCC) -     POCT glycosylated hemoglobin (Hb A1C)  Watery stools -     POCT glycosylated hemoglobin (Hb A1C)  Early satiety -     POCT glycosylated hemoglobin (Hb A1C) -     NM GASTRIC EMPTYING; Future  Encounter for medication monitoring -     POCT glycosylated hemoglobin (Hb A1C)  Gastroparesis -     NM GASTRIC EMPTYING; Future  Abdominal distension (gaseous) -     NM GASTRIC EMPTYING; Future  Other orders -     glyBURIDE (DIABETA) 5 MG tablet; Take 1 tablet (5 mg total) by mouth daily with breakfast.  Medical decision making Patient with early satiety, abdominal distention, and diabetes This is concerning for gastroparesis Would recommend NM gastric emptying  study Discussed that he should schedule a month from now and implement dietary changes and take the new medication At this point his clinical picture is suggestive of gastroparesis but wanted to ensure before we change the fiber and other components of his diet  Diabetes has deteriorated in a patient who has not been able to consume much food His intake is lower and he is losing weight but has showing a dramatic worsening of his diabetes Concerning that he has gastroparesis Referral placed for NM  Gastric emptying study Started glyburide for improved control Advised smaller frequent meal He should stop glyburide if there is hypoglycemia  Watery stools  Thought to be side effect of the metformin and gastroparesis Continued metformin but advised more frequent small meals  A total of 30 minutes were spent face-to-face with the patient during this encounter and over half of that time was spent on counseling and coordination of care.   [place]

## 2017-11-19 ENCOUNTER — Encounter (HOSPITAL_COMMUNITY): Admission: RE | Admit: 2017-11-19 | Payer: BLUE CROSS/BLUE SHIELD | Source: Ambulatory Visit

## 2017-11-21 ENCOUNTER — Other Ambulatory Visit: Payer: Self-pay | Admitting: Family Medicine

## 2017-11-28 NOTE — Progress Notes (Deleted)
No chief complaint on file.   HPI  4 review of systems  Past Medical History:  Diagnosis Date  . Diabetes mellitus without complication ([place])   . Hyperlipidemia     Current Outpatient Medications  Medication Sig Dispense Refill  . atorvastatin (LIPITOR) 40 MG tablet TAKE 1 TABLET(40 MG) BY MOUTH DAILY 90 tablet 0  . glucose blood (COOL BLOOD GLUCOSE TEST STRIPS) test strip Use as instructed 100 each 12  . glyBURIDE (DIABETA) 5 MG tablet Take 1 tablet (5 mg total) by mouth daily with breakfast. 30 tablet 3  . metFORMIN (GLUCOPHAGE) 1000 MG tablet TAKE 1 TABLET(1000 MG) BY MOUTH TWICE DAILY WITH A MEAL 60 tablet 0  . Omega-3 1000 MG CAPS Take 1 capsule (1,000 mg total) by mouth daily. 30 capsule 11  . predniSONE (STERAPRED UNI-PAK 21 TAB) 10 MG (21) TBPK tablet Take 1 tablet (10 mg total) by mouth daily. Take 6 tabs by mouth daily  for 2 days, then 5 tabs for 2 days, then 4 tabs for 2 days, then 3 tabs for 2 days, 2 tabs for 2 days, then 1 tab by mouth daily for 2 days 42 tablet 0  . tadalafil (CIALIS) 10 MG tablet Take 0.5 tablets (5 mg total) by mouth daily as needed for erectile dysfunction. 10 tablet 6   No current facility-administered medications for this visit.     Allergies: No Known Allergies  Past Surgical History:  Procedure Laterality Date  . NM MYOVIEW LTD  09/2016   Fixed small, mild basal-mid inferior perfusion defect likely diaphragmatic attenuation. No evidence of ischemia. EF 44% with diffuse hypokinesis per computer read, but visually it would appear to be better. Considered intermediate risk.    Social History   Socioeconomic History  . Marital status: Married    Spouse name: Not on file  . Number of children: 3  . Years of education: Not on file  . Highest education level: Not on file  Social Needs  . Financial resource strain: Not on file  . Food insecurity - worry: Not on file  . Food insecurity - inability: Not on file  . Transportation needs -  medical: Not on file  . Transportation needs - non-medical: Not on file  Occupational History  . Occupation: Agricultural consultant    Comment: Secondary school teacher  Tobacco Use  . Smoking status: Never Smoker  . Smokeless tobacco: Current User    Types: Snuff  . Tobacco comment: since 1980   Substance and Sexual Activity  . Alcohol use: No  . Drug use: No  . Sexual activity: Not on file  Other Topics Concern  . Not on file  Social History Narrative   Has 6 brothers, 6 sisters - 2 Brothers with with DM & HTN        Family History  Problem Relation Age of Onset  . Heart disease Father 33       MI x 2  . Stroke Father   . Hypertension Brother   . Diabetes Brother   . Hypertension Brother   . Diabetes Brother   . Colon cancer Neg Hx      ROS Review of Systems See HPI Constitution: No fevers or chills No malaise No diaphoresis Skin: No rash or itching Eyes: no blurry vision, no double vision GU: no dysuria or hematuria Neuro: no dizziness or headaches * all others reviewed and negative   Objective: There were no vitals filed for this visit.  Physical Exam  Assessment and Plan There are no diagnoses linked to this encounter.   Blimi Godby P [employer]

## 2017-12-02 ENCOUNTER — Ambulatory Visit: Payer: BLUE CROSS/BLUE SHIELD | Admitting: Family Medicine

## 2017-12-16 ENCOUNTER — Other Ambulatory Visit: Payer: Self-pay | Admitting: Family Medicine

## 2017-12-16 NOTE — Telephone Encounter (Signed)
Refill request for metformin 1000 mg #60 with 0 refills approved.

## 2018-01-10 ENCOUNTER — Other Ambulatory Visit: Payer: Self-pay | Admitting: Family Medicine

## 2018-02-09 ENCOUNTER — Other Ambulatory Visit: Payer: Self-pay | Admitting: Family Medicine

## 2018-03-11 ENCOUNTER — Other Ambulatory Visit: Payer: Self-pay | Admitting: Family Medicine

## 2018-03-11 NOTE — Telephone Encounter (Signed)
Patient called, unable to leave message, automated response says "this number has calling restrictions that have prevented the completion of your call." Patient will need an OV before more refills.

## 2018-03-12 NOTE — Telephone Encounter (Signed)
Refill request for metformin 1000 mg denied per [facility]- pt needs office visit for further refills.  Will send to scheduling pool to call pt and schedule f/u dm/labs with [facility]. Dgaddy, CMA

## 2018-03-12 NOTE — Telephone Encounter (Signed)
Please either approve meds or refuse them because clerical cannot do this and it will not allow [place] to clear the pool until the orders are signed.  Thank you

## 2018-03-29 ENCOUNTER — Other Ambulatory Visit: Payer: Self-pay | Admitting: Family Medicine

## 2018-04-03 ENCOUNTER — Ambulatory Visit (INDEPENDENT_AMBULATORY_CARE_PROVIDER_SITE_OTHER): Payer: BLUE CROSS/BLUE SHIELD | Admitting: Family Medicine

## 2018-04-03 VITALS — BP 138/90 | HR 89 | Temp 98.6°F | Ht 69.0 in | Wt 189.0 lb

## 2018-04-03 DIAGNOSIS — E119 Type 2 diabetes mellitus without complications: Secondary | ICD-10-CM

## 2018-04-03 DIAGNOSIS — E785 Hyperlipidemia, unspecified: Secondary | ICD-10-CM | POA: Diagnosis not present

## 2018-04-03 DIAGNOSIS — E1165 Type 2 diabetes mellitus with hyperglycemia: Secondary | ICD-10-CM

## 2018-04-03 DIAGNOSIS — N401 Enlarged prostate with lower urinary tract symptoms: Secondary | ICD-10-CM

## 2018-04-03 DIAGNOSIS — R3915 Urgency of urination: Secondary | ICD-10-CM

## 2018-04-03 LAB — POCT URINALYSIS DIP (MANUAL ENTRY)
BILIRUBIN UA: NEGATIVE mg/dL
Bilirubin, UA: NEGATIVE
Blood, UA: NEGATIVE
Glucose, UA: >=1000 mg/dL — AB
LEUKOCYTES UA: NEGATIVE
NITRITE UA: NEGATIVE
PROTEIN UA: NEGATIVE mg/dL
Spec Grav, UA: 1.025 (ref 1.010–1.025)
UROBILINOGEN UA: 0.2 U/dL
pH, UA: 5.5 (ref 5.0–8.0)

## 2018-04-03 LAB — POC MICROSCOPIC URINALYSIS (UMFC): MUCUS RE: ABSENT

## 2018-04-03 LAB — POCT GLYCOSYLATED HEMOGLOBIN (HGB A1C): Hemoglobin A1C: 8.9 % — AB (ref 4.0–5.6)

## 2018-04-03 MED ORDER — DAPAGLIFLOZIN PROPANEDIOL 5 MG PO TABS: 5.0000 mg | ORAL_TABLET | Freq: Every day | ORAL | 0 refills | Status: DC

## 2018-04-03 NOTE — Progress Notes (Signed)
Chief Complaint  Patient presents with  . Follow-up    DM, says he feels really weak and tired all the time with blurry vision  . Medication Refill    Metformin and Monitor strips    HPI   LUTS Enlarged prostate Patient reports urgency with urinary incontinence He denies hesitance He reports small quantity of urine He states that works night and during his daytime sleep it does not wake him up He denies any straining  Diabetes He states that he was observing Ramadan for the first 15 days of Ramadan but felt very weak and had to stop He was checking his sugars at home but ran out of test strips He reports that 15-20 days ago it was 400s He states that it has decreased to 225 He reports that his sugars are never less than 200 He reports that the glyburide bring down the sugars into the 100s for his fasting readings He reports that he does not eat lots of snacks  He drinks plenty of water  Component     Latest Ref Rng & Units 09/23/2017  Glucose     65 - 99 mg/dL 165 (H)  BUN     6 - 24 mg/dL 14  Creatinine     0.76 - 1.27 mg/dL 0.82  GFR, Est Non African American     >59 mL/min/1.73 98  GFR, Est African American     >59 mL/min/1.73 113  BUN/Creatinine Ratio     9 - 20 17  Sodium     134 - 144 mmol/L 139  Potassium     3.5 - 5.2 mmol/L 4.0  Chloride     96 - 106 mmol/L 102  CO2     20 - 29 mmol/L 22  Calcium     8.7 - 10.2 mg/dL 9.3  Total Protein     6.0 - 8.5 g/dL 7.4  Albumin     3.5 - 5.5 g/dL 4.6  Globulin, Total     1.5 - 4.5 g/dL 2.8  Albumin/Globulin Ratio     1.2 - 2.2 1.6  Total Bilirubin     0.0 - 1.2 mg/dL 0.2  Alkaline Phosphatase     39 - 117 IU/L 41  AST     0 - 40 IU/L 14  ALT     0 - 44 IU/L 14     Past Medical History:  Diagnosis Date  . Diabetes mellitus without complication ([place])   . Hyperlipidemia     Current Outpatient Medications  Medication Sig Dispense Refill  . Omega-3 1000 MG CAPS Take 1 capsule (1,000 mg total)  by mouth daily. 30 capsule 11  . predniSONE (STERAPRED UNI-PAK 21 TAB) 10 MG (21) TBPK tablet Take 1 tablet (10 mg total) by mouth daily. Take 6 tabs by mouth daily  for 2 days, then 5 tabs for 2 days, then 4 tabs for 2 days, then 3 tabs for 2 days, 2 tabs for 2 days, then 1 tab by mouth daily for 2 days 42 tablet 0  . tadalafil (CIALIS) 10 MG tablet Take 0.5 tablets (5 mg total) by mouth daily as needed for erectile dysfunction. 10 tablet 6  . atorvastatin (LIPITOR) 40 MG tablet Take 1 tablet (40 mg total) by mouth daily at 6 PM. 90 tablet 0  . dapagliflozin propanediol (FARXIGA) 5 MG TABS tablet Take 5 mg by mouth daily. 90 tablet 0  . glucose blood test strip 1 each by Other route 2 (  two) times daily. Use as instructed 100 each 12  . metFORMIN (GLUCOPHAGE) 1000 MG tablet Take 1 tablet (1,000 mg total) by mouth 2 (two) times daily with a meal. Office visit needed for refills 60 tablet 0   No current facility-administered medications for this visit.     Allergies: No Known Allergies  Past Surgical History:  Procedure Laterality Date  . NM MYOVIEW LTD  09/2016   Fixed small, mild basal-mid inferior perfusion defect likely diaphragmatic attenuation. No evidence of ischemia. EF 44% with diffuse hypokinesis per computer read, but visually it would appear to be better. Considered intermediate risk.    Social History   Socioeconomic History  . Marital status: Married    Spouse name: Not on file  . Number of children: 3  . Years of education: Not on file  . Highest education level: Not on file  Occupational History  . Occupation: Agricultural consultant    Comment: Environmental manager  . Financial resource strain: Not on file  . Food insecurity:    Worry: Not on file    Inability: Not on file  . Transportation needs:    Medical: Not on file    Non-medical: Not on file  Tobacco Use  . Smoking status: Never Smoker  . Smokeless tobacco: Current User    Types: Snuff  . Tobacco comment:  since 1980   Substance and Sexual Activity  . Alcohol use: No  . Drug use: No  . Sexual activity: Not on file  Lifestyle  . Physical activity:    Days per week: Not on file    Minutes per session: Not on file  . Stress: Not on file  Relationships  . Social connections:    Talks on phone: Not on file    Gets together: Not on file    Attends religious service: Not on file    Active member of club or organization: Not on file    Attends meetings of clubs or organizations: Not on file    Relationship status: Not on file  Other Topics Concern  . Not on file  Social History Narrative   Has 6 brothers, 6 sisters - 2 Brothers with with DM & HTN        Family History  Problem Relation Age of Onset  . Heart disease Father 59       MI x 2  . Stroke Father   . Hypertension Brother   . Diabetes Brother   . Hypertension Brother   . Diabetes Brother   . Colon cancer Neg Hx      ROS Review of Systems See HPI Constitution: No fevers or chills No malaise No diaphoresis Skin: No rash or itching Eyes: no blurry vision, no double vision GU: see hpi Neuro: no dizziness or headaches  all others reviewed and negative   Objective: Vitals:   04/03/18 1735  BP: 138/90  Pulse: 89  Temp: 98.6 F (37 C)  Weight: 189 lb (85.7 kg)  Height: _0  (1.753 m)    Physical Exam   Physical Exam  Constitutional: he is oriented to person, place, and time. he appears well-developed and well-nourished.  HENT:  Head: Normocephalic and atraumatic.  Eyes: Conjunctivae and EOM are normal.  Cardiovascular: Normal rate, regular rhythm and normal heart sounds.   Pulmonary/Chest: Effort normal and breath sounds normal. No respiratory distress. he has no wheezes.  Abdominal: Normal appearance and bowel sounds are normal. There is no tenderness. There is  no CVA tenderness.  Neurological: he is alert and oriented to person, place, and time.    Assessment and Plan Careem was seen today for  follow-up and medication refill.  Diagnoses and all orders for this visit:  Type 2 diabetes mellitus without complication, without long-term current use of insulin ([facility])  Uncontrolled type 2 diabetes mellitus with hyperglycemia ([place]) Refilled diabetic supply Advised continue monitoring Discussed side effects of farxiga -     CMP14+EGFR -     Lipid panel -     [facility] DIABETES EYE EXAM -     POCT glycosylated hemoglobin (Hb A1C)  Dyslipidemia- discussed goals based on diabetes -     CMP14+EGFR -     Lipid panel  Benign prostatic hyperplasia (BPH) with urinary urgency- discussed symptoms and plan for Urology referral  -     POCT urinalysis dipstick -     POCT Microscopic Urinalysis ([facility])  Other orders -     dapagliflozin propanediol (FARXIGA) 5 MG TABS tablet; Take 5 mg by mouth daily.   A total of 30 minutes were spent face-to-face with the patient during this encounter and over half of that time was spent on counseling and coordination of care.   [place]

## 2018-04-03 NOTE — Patient Instructions (Signed)
     IF you received an x-ray today, you will receive an invoice from [facility]. Please contact [facility] at [phone] with questions or concerns regarding your invoice.   IF you received labwork today, you will receive an invoice from [facility]. Please contact [facility] at [phone] with questions or concerns regarding your invoice.   Our billing staff will not be able to assist you with questions regarding bills from these companies.  You will be contacted with the lab results as soon as they are available. The fastest way to get your results is to activate your My Chart account. Instructions are located on the last page of this paperwork. If you have not heard from us regarding the results in 2 weeks, please contact this office.     

## 2018-04-04 LAB — LIPID PANEL
CHOL/HDL RATIO: 5.6 ratio — AB (ref 0.0–5.0)
CHOLESTEROL TOTAL: 289 mg/dL — AB (ref 100–199)
HDL: 52 mg/dL (ref >39)
LDL CALC: 184 mg/dL — AB (ref 0–99)
TRIGLYCERIDES: 263 mg/dL — AB (ref 0–149)
VLDL CHOLESTEROL CAL: 53 mg/dL — AB (ref 5–40)

## 2018-04-04 LAB — CMP14+EGFR
ALK PHOS: 45 IU/L (ref 39–117)
ALT: 17 IU/L (ref 0–44)
AST: 13 IU/L (ref 0–40)
Albumin/Globulin Ratio: 1.6 (ref 1.2–2.2)
Albumin: 4.2 g/dL (ref 3.5–5.5)
BUN / CREAT RATIO: 10 (ref 9–20)
BUN: 10 mg/dL (ref 6–24)
Bilirubin Total: <0.2 mg/dL (ref 0.0–1.2)
CALCIUM: 9.2 mg/dL (ref 8.7–10.2)
CO2: 22 mmol/L (ref 20–29)
CREATININE: 1 mg/dL (ref 0.76–1.27)
Chloride: 99 mmol/L (ref 96–106)
GFR calc Af Amer: 96 mL/min/{1.73_m2} (ref >59)
GFR, EST NON AFRICAN AMERICAN: 83 mL/min/{1.73_m2} (ref >59)
GLOBULIN, TOTAL: 2.6 g/dL (ref 1.5–4.5)
GLUCOSE: 247 mg/dL — AB (ref 65–99)
Potassium: 4.2 mmol/L (ref 3.5–5.2)
SODIUM: 138 mmol/L (ref 134–144)
Total Protein: 6.8 g/dL (ref 6.0–8.5)

## 2018-04-07 ENCOUNTER — Encounter: Payer: Self-pay | Admitting: Family Medicine

## 2018-04-07 ENCOUNTER — Other Ambulatory Visit: Payer: Self-pay | Admitting: Family Medicine

## 2018-04-07 DIAGNOSIS — E119 Type 2 diabetes mellitus without complications: Secondary | ICD-10-CM

## 2018-04-07 DIAGNOSIS — E782 Mixed hyperlipidemia: Secondary | ICD-10-CM

## 2018-04-07 MED ORDER — METFORMIN HCL 1000 MG PO TABS: 1000.0000 mg | ORAL_TABLET | Freq: Two times a day (BID) | ORAL | 0 refills | Status: DC

## 2018-04-07 MED ORDER — ATORVASTATIN CALCIUM 40 MG PO TABS: 40.0000 mg | ORAL_TABLET | Freq: Every day | ORAL | 0 refills | Status: DC

## 2018-04-08 ENCOUNTER — Other Ambulatory Visit: Payer: Self-pay

## 2018-04-08 DIAGNOSIS — E119 Type 2 diabetes mellitus without complications: Secondary | ICD-10-CM

## 2018-04-08 MED ORDER — GLUCOSE BLOOD VI STRP
ORAL_STRIP | 3 refills | Status: DC
Start: 2018-04-08 — End: 2018-04-10

## 2018-04-10 MED ORDER — GLUCOSE BLOOD VI STRP: 1.0000 | ORAL_STRIP | Freq: Two times a day (BID) | 12 refills | Status: DC

## 2018-04-29 ENCOUNTER — Encounter: Payer: Self-pay | Admitting: Family Medicine

## 2018-05-07 ENCOUNTER — Other Ambulatory Visit: Payer: Self-pay | Admitting: Family Medicine

## 2018-05-07 NOTE — Telephone Encounter (Signed)
Refill req for Metformin sent to [facility]. Per last ov - started on Farxiga No mention of continuing Metformin.

## 2018-06-12 ENCOUNTER — Telehealth: Payer: Self-pay | Admitting: Family Medicine

## 2018-06-12 NOTE — Telephone Encounter (Signed)
Left a voicemail in regards to his appt on 07/10/2018 with Dr. Nolon Rod. The provider will not be in the office on that day and needs to be rescheduled.

## 2018-06-16 DIAGNOSIS — N3941 Urge incontinence: Secondary | ICD-10-CM | POA: Diagnosis not present

## 2018-06-16 DIAGNOSIS — N311 Reflex neuropathic bladder, not elsewhere classified: Secondary | ICD-10-CM | POA: Diagnosis not present

## 2018-06-16 DIAGNOSIS — N5201 Erectile dysfunction due to arterial insufficiency: Secondary | ICD-10-CM | POA: Diagnosis not present

## 2018-07-10 ENCOUNTER — Ambulatory Visit: Payer: BLUE CROSS/BLUE SHIELD | Admitting: Family Medicine

## 2018-07-23 ENCOUNTER — Other Ambulatory Visit: Payer: Self-pay | Admitting: Family Medicine

## 2018-09-26 ENCOUNTER — Ambulatory Visit (INDEPENDENT_AMBULATORY_CARE_PROVIDER_SITE_OTHER): Payer: BLUE CROSS/BLUE SHIELD | Admitting: Emergency Medicine

## 2018-09-26 ENCOUNTER — Encounter: Payer: Self-pay | Admitting: Emergency Medicine

## 2018-09-26 ENCOUNTER — Other Ambulatory Visit: Payer: Self-pay

## 2018-09-26 VITALS — BP 122/70 | HR 88 | Temp 98.1°F | Resp 14 | Ht 69.0 in | Wt 191.6 lb

## 2018-09-26 DIAGNOSIS — Z130 Encounter for screening for diseases of the blood and blood-forming organs and certain disorders involving the immune mechanism: Secondary | ICD-10-CM | POA: Diagnosis not present

## 2018-09-26 DIAGNOSIS — E785 Hyperlipidemia, unspecified: Secondary | ICD-10-CM

## 2018-09-26 DIAGNOSIS — N401 Enlarged prostate with lower urinary tract symptoms: Secondary | ICD-10-CM | POA: Diagnosis not present

## 2018-09-26 DIAGNOSIS — Z0000 Encounter for general adult medical examination without abnormal findings: Secondary | ICD-10-CM

## 2018-09-26 DIAGNOSIS — Z1329 Encounter for screening for other suspected endocrine disorder: Secondary | ICD-10-CM

## 2018-09-26 DIAGNOSIS — E119 Type 2 diabetes mellitus without complications: Secondary | ICD-10-CM | POA: Diagnosis not present

## 2018-09-26 DIAGNOSIS — Z13228 Encounter for screening for other metabolic disorders: Secondary | ICD-10-CM

## 2018-09-26 DIAGNOSIS — Z13220 Encounter for screening for lipoid disorders: Secondary | ICD-10-CM | POA: Diagnosis not present

## 2018-09-26 DIAGNOSIS — R3915 Urgency of urination: Secondary | ICD-10-CM | POA: Diagnosis not present

## 2018-09-26 NOTE — Patient Instructions (Addendum)

## 2018-09-26 NOTE — Progress Notes (Addendum)
Wesley Davila 57 y.o.   Chief Complaint  Patient presents with  . Annual Exam    have no issues at this time    HISTORY OF PRESENT ILLNESS: This is a 57 y.o. male here today for exam.  Has no complaints or medical concerns.  Patient of Dr. Nolon Rod.  Saw her last on 04/03/2018 for diabetes follow-up.  HPI   Prior to Admission medications   Medication Sig Start Date End Date Taking? Authorizing Provider  metFORMIN (GLUCOPHAGE) 1000 MG tablet TAKE 1 TABLET BY MOUTH TWICE DAILY WITH A MEAL 07/23/18   Delia Chimes A, MD  sildenafil (VIAGRA) 100 MG tablet Take 0.5-1 tablets (50-100 mg total) by mouth daily as needed for erectile dysfunction. Patient not taking: Reported on 12/29/2015 10/11/15 01/14/16  Ezekiel Slocumb, PA-C    No Known Allergies  Patient Active Problem List   Diagnosis Date Noted  . Left ventricular dysfunction 11/21/2016  . DOE (dyspnea on exertion) 11/21/2016  . Chest pain with moderate risk for cardiac etiology 10/17/2016  . Type 2 diabetes mellitus without complication, without long-term current use of insulin ([place]) 10/17/2016  . Hyperlipidemia LDL goal <70 10/17/2016  . Family history of cardiovascular disease 10/17/2016    Past Medical History:  Diagnosis Date  . Diabetes mellitus without complication ([place])   . Hyperlipidemia     Past Surgical History:  Procedure Laterality Date  . NM MYOVIEW LTD  09/2016   Fixed small, mild basal-mid inferior perfusion defect likely diaphragmatic attenuation. No evidence of ischemia. EF 44% with diffuse hypokinesis per computer read, but visually it would appear to be better. Considered intermediate risk.    Social History   Socioeconomic History  . Marital status: Married    Spouse name: Not on file  . Number of children: 3  . Years of education: Not on file  . Highest education level: Not on file  Occupational History  . Occupation: Agricultural consultant    Comment: Environmental manager  . Financial resource  strain: Not on file  . Food insecurity:    Worry: Not on file    Inability: Not on file  . Transportation needs:    Medical: Not on file    Non-medical: Not on file  Tobacco Use  . Smoking status: Never Smoker  . Smokeless tobacco: Current User    Types: Snuff  . Tobacco comment: since 1980   Substance and Sexual Activity  . Alcohol use: No  . Drug use: No  . Sexual activity: Not on file  Lifestyle  . Physical activity:    Days per week: Not on file    Minutes per session: Not on file  . Stress: Not on file  Relationships  . Social connections:    Talks on phone: Not on file    Gets together: Not on file    Attends religious service: Not on file    Active member of club or organization: Not on file    Attends meetings of clubs or organizations: Not on file    Relationship status: Not on file  . Intimate partner violence:    Fear of current or ex partner: Not on file    Emotionally abused: Not on file    Physically abused: Not on file    Forced sexual activity: Not on file  Other Topics Concern  . Not on file  Social History Narrative   Has 6 brothers, 6 sisters - 2 Brothers with with DM & HTN  Family History  Problem Relation Age of Onset  . Heart disease Father 20       MI x 2  . Stroke Father   . Hypertension Brother   . Diabetes Brother   . Hypertension Brother   . Diabetes Brother   . Colon cancer Neg Hx      Review of Systems  Constitutional: Negative.  Negative for chills and fever.  HENT: Negative.  Negative for hearing loss.   Eyes: Negative.   Respiratory: Negative.  Negative for cough and shortness of breath.   Cardiovascular: Negative.  Negative for chest pain and palpitations.  Gastrointestinal: Negative.  Negative for abdominal pain, diarrhea, nausea and vomiting.  Genitourinary: Negative.  Negative for dysuria and hematuria.  Musculoskeletal: Negative.  Negative for back pain, myalgias and neck pain.  Skin: Negative.  Negative for  rash.  Neurological: Negative.  Negative for dizziness and headaches.  Endo/Heme/Allergies: Negative.   All other systems reviewed and are negative.   Vitals:   09/26/18 1312  BP: 122/70  Pulse: 88  Resp: 14  Temp: 98.1 F (36.7 C)  SpO2: 98%    Physical Exam  Constitutional: He is oriented to person, place, and time. He appears well-developed and well-nourished.  HENT:  Head: Normocephalic and atraumatic.  Right Ear: External ear normal.  Left Ear: External ear normal.  Nose: Nose normal.  Mouth/Throat: Oropharynx is clear and moist.  Eyes: Pupils are equal, round, and reactive to light. Conjunctivae and EOM are normal.  Neck: Normal range of motion. Neck supple. No JVD present. Carotid bruit is not present. No thyromegaly present.  Cardiovascular: Normal rate, regular rhythm, normal heart sounds and intact distal pulses.  Pulmonary/Chest: Effort normal and breath sounds normal.  Abdominal: Soft. Bowel sounds are normal. He exhibits no distension and no mass. There is no tenderness.  Musculoskeletal: Normal range of motion. He exhibits no edema or tenderness.  Lymphadenopathy:    He has no cervical adenopathy.  Neurological: He is alert and oriented to person, place, and time. No sensory deficit. He exhibits normal muscle tone. Coordination normal.  Skin: Skin is warm and dry. Capillary refill takes less than 2 seconds.  Psychiatric: He has a normal mood and affect. His behavior is normal.  Vitals reviewed.    ASSESSMENT & PLAN: Wesley Davila was seen today for annual exam.  Diagnoses and all orders for this visit:  Routine general medical examination at a health care facility  Type 2 diabetes mellitus without complication, without long-term current use of insulin (HCC) -     CBC with Differential -     Comprehensive metabolic panel -     Hemoglobin A1c  Dyslipidemia -     Lipid panel  Benign prostatic hyperplasia (BPH) with urinary urgency -     PSA(Must document that  pt has been informed of limitations of PSA testing.)  Screening for deficiency anemia -     CBC with Differential  Screening for lipoid disorders  Screening for endocrine, metabolic and immunity disorder    Patient Instructions       If you have lab work done today you will be contacted with your lab results within the next 2 weeks.  If you have not heard from [place] then please contact us. The fastest way to get your results is to register for My Chart.   IF you received an x-ray today, you will receive an invoice from [facility]. Please contact [facility] at [phone] with questions or  concerns regarding your invoice.   IF you received labwork today, you will receive an invoice from [facility]. Please contact [facility] at [phone] with questions or concerns regarding your invoice.   Our billing staff will not be able to assist you with questions regarding bills from these companies.  You will be contacted with the lab results as soon as they are available. The fastest way to get your results is to activate your My Chart account. Instructions are located on the last page of this paperwork. If you have not heard from [place] regarding the results in 2 weeks, please contact this office.       Health Maintenance, Male A healthy lifestyle and preventive care is important for your health and wellness. Ask your health care provider about what schedule of regular examinations is right for you. What should I know about weight and diet? Eat a Healthy Diet  Eat plenty of vegetables, fruits, whole grains, low-fat dairy products, and lean protein.  Do not eat a lot of foods high in solid fats, added sugars, or salt.  Maintain a Healthy Weight Regular exercise can help you achieve or maintain a healthy weight. You should:  Do at least 150 minutes of exercise each week. The exercise should increase your heart rate and make you sweat (moderate-intensity exercise).  Do  strength-training exercises at least twice a week.  Watch Your Levels of Cholesterol and Blood Lipids  Have your blood tested for lipids and cholesterol every 5 years starting at 58 years of age. If you are at high risk for heart disease, you should start having your blood tested when you are 58 years old. You may need to have your cholesterol levels checked more often if: ? Your lipid or cholesterol levels are high. ? You are older than 58 years of age. ? You are at high risk for heart disease.  What should I know about cancer screening? Many types of cancers can be detected early and may often be prevented. Lung Cancer  You should be screened every year for lung cancer if: ? You are a current smoker who has smoked for at least 30 years. ? You are a former smoker who has quit within the past 15 years.  Talk to your health care provider about your screening options, when you should start screening, and how often you should be screened.  Colorectal Cancer  Routine colorectal cancer screening usually begins at 58 years of age and should be repeated every 5-10 years until you are 58 years old. You may need to be screened more often if early forms of precancerous polyps or small growths are found. Your health care provider may recommend screening at an earlier age if you have risk factors for colon cancer.  Your health care provider may recommend using home test kits to check for hidden blood in the stool.  A small camera at the end of a tube can be used to examine your colon (sigmoidoscopy or colonoscopy). This checks for the earliest forms of colorectal cancer.  Prostate and Testicular Cancer  Depending on your age and overall health, your health care provider may do certain tests to screen for prostate and testicular cancer.  Talk to your health care provider about any symptoms or concerns you have about testicular or prostate cancer.  Skin Cancer  Check your skin from head to toe  regularly.  Tell your health care provider about any new moles or changes in moles, especially if: ? There is  a change in a mole's size, shape, or color. ? You have a mole that is larger than a pencil eraser.  Always use sunscreen. Apply sunscreen liberally and repeat throughout the day.  Protect yourself by wearing long sleeves, pants, a wide-brimmed hat, and sunglasses when outside.  What should I know about heart disease, diabetes, and high blood pressure?  If you are 44-58 years of age, have your blood pressure checked every 3-5 years. If you are 61 years of age or older, have your blood pressure checked every year. You should have your blood pressure measured twice-once when you are at a hospital or clinic, and once when you are not at a hospital or clinic. Record the average of the two measurements. To check your blood pressure when you are not at a hospital or clinic, you can use: ? An automated blood pressure machine at a pharmacy. ? A home blood pressure monitor.  Talk to your health care provider about your target blood pressure.  If you are between 59-81 years old, ask your health care provider if you should take aspirin to prevent heart disease.  Have regular diabetes screenings by checking your fasting blood sugar level. ? If you are at a normal weight and have a low risk for diabetes, have this test once every three years after the age of 24. ? If you are overweight and have a high risk for diabetes, consider being tested at a younger age or more often.  A one-time screening for abdominal aortic aneurysm (AAA) by ultrasound is recommended for men aged 26-75 years who are current or former smokers. What should I know about preventing infection? Hepatitis B If you have a higher risk for hepatitis B, you should be screened for this virus. Talk with your health care provider to find out if you are at risk for hepatitis B infection. Hepatitis C Blood testing is recommended  for:  Everyone born from [birth year] through 1966.  Anyone with known risk factors for hepatitis C.  Sexually Transmitted Diseases (STDs)  You should be screened each year for STDs including gonorrhea and chlamydia if: ? You are sexually active and are younger than 58 years of age. ? You are older than 58 years of age and your health care provider tells you that you are at risk for this type of infection. ? Your sexual activity has changed since you were last screened and you are at an increased risk for chlamydia or gonorrhea. Ask your health care provider if you are at risk.  Talk with your health care provider about whether you are at high risk of being infected with HIV. Your health care provider may recommend a prescription medicine to help prevent HIV infection.  What else can I do?  Schedule regular health, dental, and eye exams.  Stay current with your vaccines (immunizations).  Do not use any tobacco products, such as cigarettes, chewing tobacco, and e-cigarettes. If you need help quitting, ask your health care provider.  Limit alcohol intake to no more than 2 drinks per day. One drink equals 12 ounces of beer, 5 ounces of wine, or 1 ounces of hard liquor.  Do not use street drugs.  Do not share needles.  Ask your health care provider for help if you need support or information about quitting drugs.  Tell your health care provider if you often feel depressed.  Tell your health care provider if you have ever been abused or do not feel safe at home.  This information is not intended to replace advice given to you by your health care provider. Make sure you discuss any questions you have with your health care provider. Document Released: 04/12/2008 Document Revised: 06/13/2016 Document Reviewed: 07/19/2015 Elsevier Interactive Patient Education  2018 [employer]vier Inc.      Agustina Caroli, MD [facility]

## 2018-09-27 ENCOUNTER — Encounter: Payer: Self-pay | Admitting: Emergency Medicine

## 2018-09-27 LAB — HEMOGLOBIN A1C
Est. average glucose Bld gHb Est-mCnc: 220 mg/dL
HEMOGLOBIN A1C: 9.3 % — AB (ref 4.8–5.6)

## 2018-09-27 LAB — COMPREHENSIVE METABOLIC PANEL
ALT: 12 IU/L (ref 0–44)
AST: 12 IU/L (ref 0–40)
Albumin/Globulin Ratio: 1.4 (ref 1.2–2.2)
Albumin: 4.1 g/dL (ref 3.5–5.5)
Alkaline Phosphatase: 45 IU/L (ref 39–117)
BILIRUBIN TOTAL: 0.2 mg/dL (ref 0.0–1.2)
BUN / CREAT RATIO: 15 (ref 9–20)
BUN: 15 mg/dL (ref 6–24)
CHLORIDE: 99 mmol/L (ref 96–106)
CO2: 21 mmol/L (ref 20–29)
Calcium: 9 mg/dL (ref 8.7–10.2)
Creatinine, Ser: 1.01 mg/dL (ref 0.76–1.27)
GFR, EST AFRICAN AMERICAN: 94 mL/min/{1.73_m2} (ref >59)
GFR, EST NON AFRICAN AMERICAN: 82 mL/min/{1.73_m2} (ref >59)
GLUCOSE: 181 mg/dL — AB (ref 65–99)
Globulin, Total: 2.9 g/dL (ref 1.5–4.5)
Potassium: 4.2 mmol/L (ref 3.5–5.2)
Sodium: 135 mmol/L (ref 134–144)
TOTAL PROTEIN: 7 g/dL (ref 6.0–8.5)

## 2018-09-27 LAB — LIPID PANEL
Chol/HDL Ratio: 5.6 ratio — ABNORMAL HIGH (ref 0.0–5.0)
Cholesterol, Total: 279 mg/dL — ABNORMAL HIGH (ref 100–199)
HDL: 50 mg/dL (ref >39)
LDL Calculated: 185 mg/dL — ABNORMAL HIGH (ref 0–99)
Triglycerides: 221 mg/dL — ABNORMAL HIGH (ref 0–149)
VLDL Cholesterol Cal: 44 mg/dL — ABNORMAL HIGH (ref 5–40)

## 2018-09-27 LAB — CBC WITH DIFFERENTIAL/PLATELET
BASOS ABS: 0 10*3/uL (ref 0.0–0.2)
BASOS: 1 %
EOS (ABSOLUTE): 0.2 10*3/uL (ref 0.0–0.4)
Eos: 5 %
HEMOGLOBIN: 13.7 g/dL (ref 13.0–17.7)
Hematocrit: 39.7 % (ref 37.5–51.0)
IMMATURE GRANS (ABS): 0 10*3/uL (ref 0.0–0.1)
Immature Granulocytes: 0 %
Lymphocytes Absolute: 2.6 10*3/uL (ref 0.7–3.1)
Lymphs: 60 %
MCH: 30.2 pg (ref 26.6–33.0)
MCHC: 34.5 g/dL (ref 31.5–35.7)
MCV: 87 fL (ref 79–97)
Monocytes Absolute: 0.4 10*3/uL (ref 0.1–0.9)
Monocytes: 8 %
NEUTROS ABS: 1.2 10*3/uL — AB (ref 1.4–7.0)
Neutrophils: 26 %
Platelets: 339 10*3/uL (ref 150–450)
RBC: 4.54 x10E6/uL (ref 4.14–5.80)
RDW: 13.1 % (ref 12.3–15.4)
WBC: 4.4 10*3/uL (ref 3.4–10.8)

## 2018-09-27 LAB — PSA: Prostate Specific Ag, Serum: 0.6 ng/mL (ref 0.0–4.0)

## 2018-09-27 MED ORDER — ROSUVASTATIN CALCIUM 20 MG PO TABS: 20.0000 mg | ORAL_TABLET | Freq: Every day | ORAL | 3 refills | Status: DC

## 2018-09-27 NOTE — Addendum Note (Signed)
Addended by: Davina Poke on: 09/27/2018 05:57 PM   Modules accepted: Orders

## 2018-12-03 ENCOUNTER — Other Ambulatory Visit: Payer: Self-pay | Admitting: Family Medicine

## 2018-12-10 ENCOUNTER — Other Ambulatory Visit: Payer: Self-pay

## 2018-12-10 ENCOUNTER — Encounter (HOSPITAL_COMMUNITY): Payer: Self-pay | Admitting: Emergency Medicine

## 2018-12-10 ENCOUNTER — Ambulatory Visit (HOSPITAL_COMMUNITY)
Admission: EM | Admit: 2018-12-10 | Discharge: 2018-12-10 | Disposition: A | Discharge status: Home / Self Care | Payer: BLUE CROSS/BLUE SHIELD | Attending: Internal Medicine | Admitting: Internal Medicine

## 2018-12-10 DIAGNOSIS — R112 Nausea with vomiting, unspecified: Secondary | ICD-10-CM

## 2018-12-10 DIAGNOSIS — R1013 Epigastric pain: Secondary | ICD-10-CM

## 2018-12-10 DIAGNOSIS — R197 Diarrhea, unspecified: Secondary | ICD-10-CM

## 2018-12-10 MED ORDER — ONDANSETRON 4 MG PO TBDP: 4.0000 mg | ORAL_TABLET | Freq: Three times a day (TID) | ORAL | 0 refills | Status: DC | PRN

## 2018-12-10 NOTE — ED Provider Notes (Signed)
[facility]    CSN: [id number] Arrival date & time: 12/10/18  1614     History   Chief Complaint Chief Complaint  Patient presents with  . Abdominal Pain    HPI Owais Pruett is a 58 y.o. male history of DM type II, hyperlipidemia, presenting today for evaluation of nausea, abdominal pain and diarrhea.  Patient states that abdominal discomfort began yesterday and he has had poor oral intake since.  He has lacked appetite.  Has had diarrhea.  Denies any blood in the stool.  Denies associated URI symptoms.  Has felt slightly feverish and has had occasional sweating.  HPI  Past Medical History:  Diagnosis Date  . Diabetes mellitus without complication ([place])   . Hyperlipidemia     Patient Active Problem List   Diagnosis Date Noted  . Left ventricular dysfunction 11/21/2016  . DOE (dyspnea on exertion) 11/21/2016  . Chest pain with moderate risk for cardiac etiology 10/17/2016  . Type 2 diabetes mellitus without complication, without long-term current use of insulin ([place]) 10/17/2016  . Hyperlipidemia LDL goal <70 10/17/2016  . Family history of cardiovascular disease 10/17/2016    Past Surgical History:  Procedure Laterality Date  . NM MYOVIEW LTD  09/2016   Fixed small, mild basal-mid inferior perfusion defect likely diaphragmatic attenuation. No evidence of ischemia. EF 44% with diffuse hypokinesis per computer read, but visually it would appear to be better. Considered intermediate risk.       Home Medications    Prior to Admission medications   Medication Sig Start Date End Date Taking? Authorizing Provider  metFORMIN (GLUCOPHAGE) 1000 MG tablet TAKE 1 TABLET BY MOUTH TWICE DAILY WITH A MEAL 12/03/18   Delia Chimes A, MD  ondansetron (ZOFRAN ODT) 4 MG disintegrating tablet Take 1 tablet (4 mg total) by mouth every 8 (eight) hours as needed for nausea or vomiting. 12/10/18   ,  C, PA-C  rosuvastatin (CRESTOR) 20 MG tablet Take 1 tablet (20 mg  total) by mouth daily. 09/27/18   Horald Pollen, MD    Family History Family History  Problem Relation Age of Onset  . Heart disease Father 31       MI x 2  . Stroke Father   . Hypertension Brother   . Diabetes Brother   . Hypertension Brother   . Diabetes Brother   . Colon cancer Neg Hx     Social History Social History   Tobacco Use  . Smoking status: Never Smoker  . Smokeless tobacco: Current User    Types: Snuff  . Tobacco comment: since 1980   Substance Use Topics  . Alcohol use: No  . Drug use: No     Allergies   Patient has no known allergies.   Review of Systems Review of Systems  Constitutional: Positive for chills, fatigue and fever. Negative for activity change and appetite change.  HENT: Negative for congestion, ear pain, rhinorrhea, sinus pressure, sore throat and trouble swallowing.   Eyes: Negative for discharge and redness.  Respiratory: Negative for cough, chest tightness and shortness of breath.   Cardiovascular: Negative for chest pain.  Gastrointestinal: Positive for abdominal pain, diarrhea and nausea. Negative for vomiting.  Musculoskeletal: Negative for myalgias.  Skin: Negative for rash.  Neurological: Negative for dizziness, light-headedness and headaches.     Physical Exam Triage Vital Signs ED Triage Vitals  Enc Vitals Group     BP 12/10/18 1645 122/66     Pulse Rate 12/10/18 1645 (!)  107     Resp 12/10/18 1645 18     Temp 12/10/18 1645 98 F (36.7 C)     Temp Source 12/10/18 1645 Temporal     SpO2 12/10/18 1645 100 %     Weight --      Height --      Head Circumference --      Peak Flow --      Pain Score 12/10/18 1643 10     Pain Loc --      Pain Edu? --      Excl. in [place]? --    No data found.  Updated Vital Signs BP 122/66 (BP Location: Right Arm)   Pulse (!) 107   Temp 98 F (36.7 C) (Temporal)   Resp 18   SpO2 100%   Visual Acuity Right Eye Distance:   Left Eye Distance:   Bilateral Distance:     Right Eye Near:   Left Eye Near:    Bilateral Near:     Physical Exam Vitals signs and nursing note reviewed.  Constitutional:      Appearance: He is well-developed.  HENT:     Head: Normocephalic and atraumatic.  Eyes:     Conjunctiva/sclera: Conjunctivae normal.  Neck:     Musculoskeletal: Neck supple.  Cardiovascular:     Rate and Rhythm: Regular rhythm. Tachycardia present.     Heart sounds: No murmur.  Pulmonary:     Effort: Pulmonary effort is normal. No respiratory distress.     Breath sounds: Normal breath sounds.  Abdominal:     Palpations: Abdomen is soft.     Tenderness: There is no abdominal tenderness.     Comments: Tenderness to palpation of epigastric area, negative rebound Nontender to bilateral lower quadrants, negative rebound, negative Rovsing, negative Murphy, negative McBurney  Skin:    General: Skin is warm and dry.  Neurological:     Mental Status: He is alert.      UC Treatments / Results  Labs (all labs ordered are listed, but only abnormal results are displayed) Labs Reviewed - No data to display  EKG None  Radiology No results found.  Procedures Procedures (including critical care time)  Medications Ordered in UC Medications - No data to display  Initial Impression / Assessment and Plan / UC Course  I have reviewed the triage vital signs and the nursing notes.  Pertinent labs & imaging results that were available during my care of the patient were reviewed by me and considered in my medical decision making (see chart for details).     Patient with acute onset of nausea, diarrhea and abdominal discomfort.  Abdominal exam negative for peritoneal signs.  Vital signs stable.  Most likely viral gastroenteritis.  Will treat as such symptomatically with Zofran as needed.  Discussed importance of preventing dehydration, will do trial of oral rehydration.  Patient slightly tachycardic and likely related to this mild dehydration.  Continue  to monitor pain and symptoms,Discussed strict return precautions. Patient verbalized understanding and is agreeable with plan.  Final Clinical Impressions(s) / UC Diagnoses   Final diagnoses:  Epigastric pain  Nausea vomiting and diarrhea     Discharge Instructions     Your nausea, vomiting, and diarrhea appear to have a viral cause. Your symptoms should improve over the next week as your body continues to rid the infectious cause.  For nausea: Zofran prescribed. Begin with every 6 hours, than as you are able to hold food down, take  it as needed. Start with clear liquids, then move to plain foods like bananas, rice, applesauce, toast, broth, grits, oatmeal. As those food settle okay you may transition to your normal foods. Avoid spicy and greasy foods as much as possible.  For Diarrhea: This is your body's natural way of getting rid of a virus. You may try taking 1 imodium to decrease amount of stools a day, but we do not want you to stop your diarrhea.   Preventing dehydration is key! You need to replace the fluid your body is expelling. Drink plenty of fluids, may use Pedialyte or sports drinks.   Please return if you are experiencing blood in your vomit or stool or experiencing dizziness, lightheadedness, extreme fatigue, increased abdominal pain.    ED Prescriptions    Medication Sig Dispense Auth. Provider   ondansetron (ZOFRAN ODT) 4 MG disintegrating tablet Take 1 tablet (4 mg total) by mouth every 8 (eight) hours as needed for nausea or vomiting. 20 tablet , [place] C, PA-C     Controlled Substance Prescriptions [place] Controlled Substance Registry consulted? Not Applicable   Janith Lima, [place] 12/10/18 1727

## 2018-12-10 NOTE — ED Triage Notes (Signed)
Complains of abdominal pain that started yesterday.  Pain is in center abdomen.  Patient has been nauseated, no vomiting.  Patient has had diarrhea.

## 2019-01-27 ENCOUNTER — Encounter: Payer: Self-pay | Admitting: Gastroenterology

## 2019-03-05 ENCOUNTER — Emergency Department (HOSPITAL_COMMUNITY): Payer: BLUE CROSS/BLUE SHIELD

## 2019-03-05 ENCOUNTER — Emergency Department (HOSPITAL_COMMUNITY)
Admission: EM | Admit: 2019-03-05 | Discharge: 2019-03-05 | Disposition: A | Discharge status: Home / Self Care | Payer: BLUE CROSS/BLUE SHIELD | Attending: Emergency Medicine | Admitting: Emergency Medicine

## 2019-03-05 ENCOUNTER — Encounter (HOSPITAL_COMMUNITY): Payer: Self-pay | Admitting: Emergency Medicine

## 2019-03-05 ENCOUNTER — Other Ambulatory Visit: Payer: Self-pay

## 2019-03-05 DIAGNOSIS — U071 COVID-19: Secondary | ICD-10-CM | POA: Diagnosis not present

## 2019-03-05 DIAGNOSIS — Z79899 Other long term (current) drug therapy: Secondary | ICD-10-CM | POA: Diagnosis not present

## 2019-03-05 DIAGNOSIS — F17290 Nicotine dependence, other tobacco product, uncomplicated: Secondary | ICD-10-CM | POA: Insufficient documentation

## 2019-03-05 DIAGNOSIS — R5383 Other fatigue: Secondary | ICD-10-CM | POA: Diagnosis not present

## 2019-03-05 DIAGNOSIS — E119 Type 2 diabetes mellitus without complications: Secondary | ICD-10-CM | POA: Diagnosis not present

## 2019-03-05 DIAGNOSIS — Z7984 Long term (current) use of oral hypoglycemic drugs: Secondary | ICD-10-CM | POA: Diagnosis not present

## 2019-03-05 DIAGNOSIS — R05 Cough: Secondary | ICD-10-CM | POA: Diagnosis not present

## 2019-03-05 LAB — COMPREHENSIVE METABOLIC PANEL
ALT: 26 U/L (ref 0–44)
AST: 35 U/L (ref 15–41)
Albumin: 3.5 g/dL (ref 3.5–5.0)
Alkaline Phosphatase: 63 U/L (ref 38–126)
Anion gap: 14 (ref 5–15)
BUN: 10 mg/dL (ref 6–20)
CO2: 26 mmol/L (ref 22–32)
Calcium: 8.5 mg/dL — ABNORMAL LOW (ref 8.9–10.3)
Chloride: 93 mmol/L — ABNORMAL LOW (ref 98–111)
Creatinine, Ser: 1.06 mg/dL (ref 0.61–1.24)
GFR calc Af Amer: >60 mL/min (ref >60)
GFR calc non Af Amer: >60 mL/min (ref >60)
Glucose, Bld: 171 mg/dL — ABNORMAL HIGH (ref 70–99)
Potassium: 3.8 mmol/L (ref 3.5–5.1)
Sodium: 133 mmol/L — ABNORMAL LOW (ref 135–145)
Total Bilirubin: 0.4 mg/dL (ref 0.3–1.2)
Total Protein: 7.6 g/dL (ref 6.5–8.1)

## 2019-03-05 LAB — CBC WITH DIFFERENTIAL/PLATELET
Abs Immature Granulocytes: 0 10*3/uL (ref 0.00–0.07)
Basophils Absolute: 0 10*3/uL (ref 0.0–0.1)
Basophils Relative: 0 %
Eosinophils Absolute: 0 10*3/uL (ref 0.0–0.5)
Eosinophils Relative: 0 %
HCT: 43.8 % (ref 39.0–52.0)
Hemoglobin: 14.2 g/dL (ref 13.0–17.0)
Lymphocytes Relative: 15 %
Lymphs Abs: 0.8 10*3/uL (ref 0.7–4.0)
MCH: 29.8 pg (ref 26.0–34.0)
MCHC: 32.4 g/dL (ref 30.0–36.0)
MCV: 92 fL (ref 80.0–100.0)
Monocytes Absolute: 0.1 10*3/uL (ref 0.1–1.0)
Monocytes Relative: 2 %
Neutro Abs: 4.3 10*3/uL (ref 1.7–7.7)
Neutrophils Relative %: 83 %
Platelets: 272 10*3/uL (ref 150–400)
RBC: 4.76 MIL/uL (ref 4.22–5.81)
RDW: 13.2 % (ref 11.5–15.5)
WBC: 5.2 10*3/uL (ref 4.0–10.5)
nRBC: 0 % (ref 0.0–0.2)
nRBC: 1 /100 WBC — ABNORMAL HIGH

## 2019-03-05 LAB — LACTIC ACID, PLASMA: Lactic Acid, Venous: 2 mmol/L (ref 0.5–1.9)

## 2019-03-05 LAB — CBG MONITORING, ED: Glucose-Capillary: 160 mg/dL — ABNORMAL HIGH (ref 70–99)

## 2019-03-05 LAB — SARS CORONAVIRUS 2 BY RT PCR (HOSPITAL ORDER, PERFORMED IN ~~LOC~~ HOSPITAL LAB): SARS Coronavirus 2: POSITIVE — AB

## 2019-03-05 MED ORDER — SODIUM CHLORIDE 0.9 % IV BOLUS
1000.0000 mL | Freq: Once | INTRAVENOUS | Status: AC
  Administered 2019-03-05: 22:00:00 1000 mL via INTRAVENOUS

## 2019-03-05 MED ORDER — ACETAMINOPHEN 500 MG PO TABS
1000.0000 mg | ORAL_TABLET | Freq: Once | ORAL | Status: AC
  Administered 2019-03-05: 1000 mg via ORAL
  Filled 2019-03-05: qty 2

## 2019-03-05 NOTE — ED Provider Notes (Signed)
[facility] EMERGENCY DEPARTMENT Provider Note   CSN: [id number] Arrival date & time: 03/05/19  1926    History   Chief Complaint Chief Complaint  Patient presents with   Cough   Hypotension    HPI Wesley Davila is a 58 y.o. male. With a pmhx of DM who presents to the ED with chief complaint of cough for 3 days. Denies having a fever, SOB, CP. States he is only concerned about his cough and that he might have COVID. States he has been working and is unsure if anyone around him has been sick. Has not tried anything. Nothing makes his cough better or worse.      HPI  Past Medical History:  Diagnosis Date   Diabetes mellitus without complication ([place])    Hyperlipidemia     Patient Active Problem List   Diagnosis Date Noted   Left ventricular dysfunction 11/21/2016   DOE (dyspnea on exertion) 11/21/2016   Chest pain with moderate risk for cardiac etiology 10/17/2016   Type 2 diabetes mellitus without complication, without long-term current use of insulin ([place]) 10/17/2016   Hyperlipidemia LDL goal <70 10/17/2016   Family history of cardiovascular disease 10/17/2016    Past Surgical History:  Procedure Laterality Date   NM MYOVIEW LTD  09/2016   Fixed small, mild basal-mid inferior perfusion defect likely diaphragmatic attenuation. No evidence of ischemia. EF 44% with diffuse hypokinesis per computer read, but visually it would appear to be better. Considered intermediate risk.        Home Medications    Prior to Admission medications   Medication Sig Start Date End Date Taking? Authorizing Provider  metFORMIN (GLUCOPHAGE) 1000 MG tablet TAKE 1 TABLET BY MOUTH TWICE DAILY WITH A MEAL 12/03/18   Delia Chimes A, MD  ondansetron (ZOFRAN ODT) 4 MG disintegrating tablet Take 1 tablet (4 mg total) by mouth every 8 (eight) hours as needed for nausea or vomiting. 12/10/18   Wieters, Hallie C, PA-C  rosuvastatin (CRESTOR) 20 MG tablet Take 1 tablet  (20 mg total) by mouth daily. 09/27/18   Horald Pollen, MD    Family History Family History  Problem Relation Age of Onset   Heart disease Father 7       MI x 2   Stroke Father    Hypertension Brother    Diabetes Brother    Hypertension Brother    Diabetes Brother    Colon cancer Neg Hx     Social History Social History   Tobacco Use   Smoking status: Never Smoker   Smokeless tobacco: Current User    Types: Snuff   Tobacco comment: since 1980   Substance Use Topics   Alcohol use: No   Drug use: No     Allergies   Patient has no known allergies.   Review of Systems Review of Systems  Constitutional: Positive for appetite change and fatigue. Negative for activity change and fever.  HENT: Negative for congestion.   Respiratory: Positive for cough. Negative for shortness of breath.   Cardiovascular: Negative for chest pain.  Gastrointestinal: Negative for abdominal pain, diarrhea, nausea and vomiting.  Genitourinary: Negative for difficulty urinating and dysuria.  Musculoskeletal: Negative for back pain and neck pain.  Neurological: Negative for headaches.  All other systems reviewed and are negative.    Physical Exam Updated Vital Signs BP 120/83    Pulse (!) 119    Temp (!) 102.3 F (39.1 C) (Oral)    Resp Marland Kitchen)  22    Ht 5\' 9"  (1.753 m)    Wt 86.9 kg    SpO2 92%    BMI 28.29 kg/m   Physical Exam Vitals signs and nursing note reviewed.  Constitutional:      General: He is not in acute distress.    Appearance: He is well-developed. He is ill-appearing.  HENT:     Head: Normocephalic and atraumatic.  Eyes:     Conjunctiva/sclera: Conjunctivae normal.  Neck:     Musculoskeletal: Neck supple.  Cardiovascular:     Rate and Rhythm: Regular rhythm. Tachycardia present.     Heart sounds: No murmur.  Pulmonary:     Breath sounds: Normal breath sounds. No wheezing, rhonchi or rales.     Comments: tachypneic 20s Abdominal:     Palpations:  Abdomen is soft.     Tenderness: There is no abdominal tenderness.  Skin:    General: Skin is warm and dry.  Neurological:     Mental Status: He is alert.      ED Treatments / Results  Labs (all labs ordered are listed, but only abnormal results are displayed) Labs Reviewed  SARS CORONAVIRUS 2 ([facility]) - Abnormal; Notable for the following components:      Result Value   SARS Coronavirus 2 POSITIVE (*)    All other components within normal limits  LACTIC ACID, PLASMA - Abnormal; Notable for the following components:   Lactic Acid, Venous 2.0 (*)    All other components within normal limits  COMPREHENSIVE METABOLIC PANEL - Abnormal; Notable for the following components:   Sodium 133 (*)    Chloride 93 (*)    Glucose, Bld 171 (*)    Calcium 8.5 (*)    All other components within normal limits  CBC WITH DIFFERENTIAL/PLATELET - Abnormal; Notable for the following components:   nRBC 1 (*)    All other components within normal limits  CBG MONITORING, ED - Abnormal; Notable for the following components:   Glucose-Capillary 160 (*)    All other components within normal limits  CULTURE, BLOOD (ROUTINE X 2)  CULTURE, BLOOD (ROUTINE X 2)  LACTIC ACID, PLASMA  URINALYSIS, ROUTINE W REFLEX MICROSCOPIC    EKG None  Radiology Dg Chest Port 1 View  Result Date: 03/05/2019 CLINICAL DATA:  Cough EXAM: PORTABLE CHEST 1 VIEW COMPARISON:  02/18/2016 FINDINGS: The cardiac silhouette is mildly enlarged. The lung volumes are low. There are prominent interstitial lung markings bilaterally. There is no pneumothorax. There is some blunting of the costophrenic angles bilaterally. IMPRESSION: 1. Low lung volumes. Prominent interstitial lung markings bilaterally which can be seen in patients with pulmonary edema versus an atypical infectious process. 2. Bibasilar airspace opacities concerning for atelectasis or developing infiltrates. 3. Cardiomegaly.   Probable trace bilateral pleural effusions. Electronically Signed   By: Constance Holster M.D.   On: 03/05/2019 20:17    Procedures Procedures (including critical care time)  Medications Ordered in ED Medications  acetaminophen (TYLENOL) tablet 1,000 mg (1,000 mg Oral Given 03/05/19 2005)  sodium chloride 0.9 % bolus 1,000 mL (0 mLs Intravenous Stopped 03/05/19 2242)     Initial Impression / Assessment and Plan / ED Course  I have reviewed the triage vital signs and the nursing notes.  Pertinent labs & imaging results that were available during my care of the patient were reviewed by me and considered in my medical decision making (see chart for details).    Wesley  Davila was evaluated in Emergency Department on 03/05/2019 for the symptoms described in the history of present illness. He was evaluated in the context of the global COVID-19 pandemic, which necessitated consideration that the patient might be at risk for infection with the SARS-CoV-2 virus that causes COVID-19. Institutional protocols and algorithms that pertain to the evaluation of patients at risk for COVID-19 are in a state of rapid change based on information released by regulatory bodies including the CDC and federal and state organizations. These policies and algorithms were followed during the patient's care in the ED.    Wesley Davila is a 58 y.o. male. With a pmhx of DM who presents to the ED with chief complaint of cough for 3 days. Denies having a fever, SOB, CP.  On initial exam patient is ill appearing, not in acute distress. He is febrile to 102.3 and tachycardic to 129. On triage his BP was noted to be 59/30. However, upon my initial evaluation/when the patient was bedded his BP was 120/80 and remained normotensive throughout his entire stay. Physical exam as above.    ED interpretation of Labs: COVID-19 Positive. Labs otherwise consistent with COVID.    ED interpretation of Imaging: CXR with bilateral  infiltrates consistent with an atypical infection   Medications given in ED: Tylenol 1000mg    Patient with improvement in HR with tylenol and NS bolus, however remains tachycardic.  Throughout his hospital stay he has been satting above 94% on room air.  Lengthy discussion had by myself and attending in regards to possible admission for observation given positive COVID testing and patient has remained tachycardic tachypneic.  However at this time patient is requesting discharge home states that he has "done a lot of reading about COVID and feels comfortable going home".  Advised patient that with this being day 3 he is likely to get worse and could progress to respiratory failure.  Went over strict return precautions including calling EMS if he experiences any worsening shortness of breath.  Patient states understanding and still expresses that he would like to be discharged.  At this time he is mentating appropriately and satting well on room air.  Patient discharged home.  Prior to discharge isolation requirements were discussed with patient.  Patient provided work note for the next 2 weeks so that he can isolate at home.  Patient states that he will not leave his home and has a daughter who could bring him food.  Patient with no further questions at this time.  Final Clinical Impressions(s) / ED Diagnoses   Final diagnoses:  [id number] virus detected    ED Discharge Orders    None       Doneta Public, MD 03/05/19 1025    Veryl Speak, MD 03/06/19 [phone]

## 2019-03-05 NOTE — ED Triage Notes (Signed)
Here via POV with c/o cough X 3 days.  While getting vitals in waiting room eyes rolled back and patient almost passed out.  BP low and HR elevated at that time.  Denied having a fever.  102.5 in waiting.

## 2019-03-05 NOTE — Discharge Instructions (Signed)
Please return to the ED immediately if you experience worsening SOB.   Take Tylenol for fever (no more than 4000mg  per day)  Please see the following Isolation instructions

## 2019-03-10 LAB — CULTURE, BLOOD (ROUTINE X 2)
Culture: NO GROWTH
Culture: NO GROWTH
Special Requests: ADEQUATE
Special Requests: ADEQUATE

## 2019-03-13 ENCOUNTER — Ambulatory Visit: Payer: Self-pay | Admitting: Family Medicine

## 2019-03-13 NOTE — Telephone Encounter (Signed)
Appt. 03/16/2019

## 2019-03-13 NOTE — Telephone Encounter (Signed)
Pt. Reports he was ill lat week and went to the hospital. Tested positive for COVID 19 and is requesting a virtual visit for follow up. Reports he feels better today. "No symptoms today."Warm transfer to [facility] in the practice for virtual visit.  Answer Assessment - Initial Assessment Questions 1. COVID-19 DIAGNOSIS: "Who made your Coronavirus (COVID-19) diagnosis?" "Was it confirmed by a positive lab test?" If not diagnosed by a HCP, ask "Are there lots of cases (community spread) where you live?" (See public health department website, if unsure)   * MAJOR community spread: high number of cases; numbers of cases are increasing; many people hospitalized.   * MINOR community spread: low number of cases; not increasing; few or no people hospitalized     Tested positive last Friday 2. ONSET: "When did the COVID-19 symptoms start?"      Last week 3. WORST SYMPTOM: "What is your worst symptom?" (e.g., cough, fever, shortness of breath, muscle aches)     No symptoms 4. COUGH: "Do you have a cough?" If so, ask: "How bad is the cough?"       No 5. FEVER: "Do you have a fever?" If so, ask: "What is your temperature, how was it measured, and when did it start?"     No 6. RESPIRATORY STATUS: "Describe your breathing?" (e.g., shortness of breath, wheezing, unable to speak)      No 7. BETTER-SAME-WORSE: "Are you getting better, staying the same or getting worse compared to yesterday?"  If getting worse, ask, "In what way?"     Better 8. HIGH RISK DISEASE: "Do you have any chronic medical problems?" (e.g., asthma, heart or lung disease, weak immune system, etc.)     No 9. PREGNANCY: "Is there any chance you are pregnant?" "When was your last menstrual period?"     n/a 10. OTHER SYMPTOMS: "Do you have any other symptoms?"  (e.g., runny nose, headache, sore throat, loss of smell)       No  Protocols used: CORONAVIRUS (COVID-19) DIAGNOSED OR SUSPECTED-A-AH

## 2019-03-16 ENCOUNTER — Other Ambulatory Visit: Payer: Self-pay

## 2019-03-16 ENCOUNTER — Telehealth (INDEPENDENT_AMBULATORY_CARE_PROVIDER_SITE_OTHER): Payer: BLUE CROSS/BLUE SHIELD | Admitting: Family Medicine

## 2019-03-16 DIAGNOSIS — J40 Bronchitis, not specified as acute or chronic: Secondary | ICD-10-CM | POA: Diagnosis not present

## 2019-03-16 DIAGNOSIS — U071 COVID-19: Secondary | ICD-10-CM | POA: Diagnosis not present

## 2019-03-16 DIAGNOSIS — E1165 Type 2 diabetes mellitus with hyperglycemia: Secondary | ICD-10-CM

## 2019-03-16 MED ORDER — AZITHROMYCIN 250 MG PO TABS: ORAL_TABLET | ORAL | 0 refills | Status: DC

## 2019-03-16 NOTE — Patient Instructions (Signed)
° ° ° °  If you have lab work done today you will be contacted with your lab results within the next 2 weeks.  If you have not heard from us then please contact us. The fastest way to get your results is to register for My Chart. ° ° °IF you received an x-ray today, you will receive an invoice from [facility]. Please contact [facility] at [phone] with questions or concerns regarding your invoice.  ° °IF you received labwork today, you will receive an invoice from [facility]. Please contact [facility] at [phone] with questions or concerns regarding your invoice.  ° °Our billing staff will not be able to assist you with questions regarding bills from these companies. ° °You will be contacted with the lab results as soon as they are available. The fastest way to get your results is to activate your My Chart account. Instructions are located on the last page of this paperwork. If you have not heard from us regarding the results in 2 weeks, please contact this office. °  ° ° ° °

## 2019-03-16 NOTE — Progress Notes (Signed)
Telemedicine Encounter- SOAP NOTE Established Patient  This telephone encounter was conducted with the patient's (or proxy's) verbal consent via audio telecommunications: yes/no: Yes Patient was instructed to have this encounter in a suitably private space; and to only have persons present to whom they give permission to participate. In addition, patient identity was confirmed by use of name plus two identifiers (DOB and address).  I discussed the limitations, risks, security and privacy concerns of performing an evaluation and management service by telephone and the availability of in person appointments. I also discussed with the patient that there may be a patient responsible charge related to this service. The patient expressed understanding and agreed to proceed.  I spent a total of TIME; 0 MIN TO 60 MIN: 25 minutes talking with the patient or their proxy.  CC: Coronavirus positive  Subjective   Wesley Davila is a 58 y.o. established patient. Telephone visit today for  HPI  Patient is a 58 year old well known to our practice who was diagnosed with coronavirus 03/05/2019.  He had symptoms of fever with a temperature of 102.5 tachycardia tachypnea but was noted per ER records to have adequate oxygen saturation.  He is calling today to follow-up on his symptoms and states that he has not had a fever in 5 days.  He states that he is able to walk around his house without being short of breath or wheezing.  He states that he walks around for exercise because he is on a new is that people with coronavirus can get blood clots.  He has had very poor appetite and has felt very weak.  He recently started having small meals.  He is a type II diabetic and states that his sugars have been in a good range most of the fasting glucoses are in the 130s.  He denies any hypoglycemia and denies any sugars greater than 200.  He believes that he was exposed at work.  He has not been back to work since 03/04/2019.  He reports that now he has a cough that is becoming productive.  He states that he is interested in something to help with his cough.    Lab Results  Component Value Date   HGBA1C 9.3 (H) 09/26/2018    Patient Active Problem List   Diagnosis Date Noted  . Left ventricular dysfunction 11/21/2016  . DOE (dyspnea on exertion) 11/21/2016  . Chest pain with moderate risk for cardiac etiology 10/17/2016  . Type 2 diabetes mellitus without complication, without long-term current use of insulin ([place]) 10/17/2016  . Hyperlipidemia LDL goal <70 10/17/2016  . Family history of cardiovascular disease 10/17/2016    Past Medical History:  Diagnosis Date  . Diabetes mellitus without complication ([place])   . Hyperlipidemia     Current Outpatient Medications  Medication Sig Dispense Refill  . metFORMIN (GLUCOPHAGE) 1000 MG tablet TAKE 1 TABLET BY MOUTH TWICE DAILY WITH A MEAL 180 tablet 0  . ondansetron (ZOFRAN ODT) 4 MG disintegrating tablet Take 1 tablet (4 mg total) by mouth every 8 (eight) hours as needed for nausea or vomiting. 20 tablet 0  . rosuvastatin (CRESTOR) 20 MG tablet Take 1 tablet (20 mg total) by mouth daily. 90 tablet 3  . azithromycin (ZITHROMAX) 250 MG tablet Take 2 tablets on day 1, then 1 tab each day after. For +COVID lung infection. 6 tablet 0   No current facility-administered medications for this visit.     No Known Allergies  Social History  Socioeconomic History  . Marital status: Married    Spouse name: Not on file  . Number of children: 3  . Years of education: Not on file  . Highest education level: Not on file  Occupational History  . Occupation: Agricultural consultant    Comment: Environmental manager  . Financial resource strain: Not on file  . Food insecurity:    Worry: Not on file    Inability: Not on file  . Transportation needs:    Medical: Not on file    Non-medical: Not on file  Tobacco Use  . Smoking status: Never Smoker  . Smokeless  tobacco: Current User    Types: Snuff  . Tobacco comment: since 1980   Substance and Sexual Activity  . Alcohol use: No  . Drug use: No  . Sexual activity: Not on file  Lifestyle  . Physical activity:    Days per week: Not on file    Minutes per session: Not on file  . Stress: Not on file  Relationships  . Social connections:    Talks on phone: Not on file    Gets together: Not on file    Attends religious service: Not on file    Active member of club or organization: Not on file    Attends meetings of clubs or organizations: Not on file    Relationship status: Not on file  . Intimate partner violence:    Fear of current or ex partner: Not on file    Emotionally abused: Not on file    Physically abused: Not on file    Forced sexual activity: Not on file  Other Topics Concern  . Not on file  Social History Narrative   Has 6 brothers, 6 sisters - 2 Brothers with with DM & HTN        ROS  Objective  Patient was evaluated over the phone therefore there was no physical exam. Normal pulmonary effort with occasional cough.  With a very raspy voice. Normal thought process and speech pattern  Vitals as reported by the patient: There were no vitals filed for this visit.     Study Result   CLINICAL DATA:  Cough  EXAM: PORTABLE CHEST 1 VIEW  COMPARISON:  02/18/2016  FINDINGS: The cardiac silhouette is mildly enlarged. The lung volumes are low. There are prominent interstitial lung markings bilaterally. There is no pneumothorax. There is some blunting of the costophrenic angles bilaterally.  IMPRESSION: 1. Low lung volumes. Prominent interstitial lung markings bilaterally which can be seen in patients with pulmonary edema versus an atypical infectious process. 2. Bibasilar airspace opacities concerning for atelectasis or developing infiltrates. 3. Cardiomegaly.  Probable trace bilateral pleural effusions.   Electronically Signed   By: Constance Holster  M.D.   On: 03/05/2019 20:17     Diagnoses and all orders for this visit:  Bronchitis due to COVID-19 virus -     azithromycin (ZITHROMAX) 250 MG tablet; Take 2 tablets on day 1, then 1 tab each day after. For +COVID lung infection.  Type 2 diabetes mellitus with hyperglycemia, without long-term current use of insulin (HCC) -     azithromycin (ZITHROMAX) 250 MG tablet; Take 2 tablets on day 1, then 1 tab each day after. For +COVID lung infection.  COVID-19 virus detected   Discussed with patient that based on his chest x-ray he might be developing coronavirus bronchitis or pneumonia Discussed that he should try Z-Pak as instructed We will do a  follow-up phone call in 1 week however gave instructions that if he develops worsening cough, shortness of breath, wheezing, feeling like he is becoming more fatigued to go to the closest ER or give her office a call for directions in terms of what to do next. We will complete patient's work information as he should continue to stay out of work at least until after his next follow-up visit which will be slated for 1 week. Education was provided about the course of the disease and the time for recovery.    I discussed the assessment and treatment plan with the patient. The patient was provided an opportunity to ask questions and all were answered. The patient agreed with the plan and demonstrated an understanding of the instructions.   The patient was advised to call back or seek an in-person evaluation if the symptoms worsen or if the condition fails to improve as anticipated.  I provided 25 minutes of non-face-to-face time during this encounter.  Forrest Moron, MD  Primary Care at [facility]

## 2019-03-16 NOTE — Progress Notes (Signed)
CC: f/u positive corona.  Per pt he is better from the corona but thinks he needs an antibiotic for congestion, ? Sinus infection.  Pt would like forms completed for disability.  No recent weight or bp taken.  No travel outside the [place] or [place] in the past 3 weeks.

## 2019-03-20 ENCOUNTER — Ambulatory Visit: Payer: Self-pay | Admitting: *Deleted

## 2019-03-20 NOTE — Telephone Encounter (Signed)
Pt's daughter calling with Samh who is currently with the pt who is experiencing complains of left sided chest pain that occurs with moving and coughing. Pt states that pain does not get worse with taking a deep breath. Pt currently rates the pain at 5 and describes the pain as sharp and does experience some shortness of breath. Pt's daughter states that the pt was positive for COVID-19. Pt's daughter advised to seek treatment at [facility] ED. Spoke with charge nurse Manuela Schwartz at [facility] ED and notified her that the pt was going to be coming to ED with complaints of chest pain.   Reason for Disposition . [1] Chest pain(s) lasting a few seconds AND [2] persists > 3 days  Answer Assessment - Initial Assessment Questions 1. LOCATION: "Where does it hurt?"       Bottom are of the left side of chest 2. RADIATION: "Does the pain go anywhere else?" (e.g., into neck, jaw, arms, back)    no 3. ONSET: "When did the chest pain begin?" (Minutes, hours or days)      A day and a 1/2 ago 4. PATTERN "Does the pain come and go, or has it been constant since it started?"  "Does it get worse with exertion?"      Pain with movement and cough 5. DURATION: "How long does it last" (e.g., seconds, minutes, hours)     sec 6. SEVERITY: "How bad is the pain?"  (e.g., Scale 1-10; mild, moderate, or severe)    - MILD (1-3): doesn't interfere with normal activities     - MODERATE (4-7): interferes with normal activities or awakens from sleep    - SEVERE (8-10): excruciating pain, unable to do any normal activities    5 7. CARDIAC RISK FACTORS: "Do you have any history of heart problems or risk factors for heart disease?" (e.g., prior heart attack, angina; high blood pressure, diabetes, being overweight, high cholesterol, smoking, or strong family history of heart disease)     DM  8. PULMONARY RISK FACTORS: "Do you have any history of lung disease?"  (e.g., blood clots in lung, asthma, emphysema, birth control pills)     No 9. CAUSE:  "What do you think is causing the chest pain?"     unknown 10. OTHER SYMPTOMS: "Do you have any other symptoms?" (e.g., dizziness, nausea, vomiting, sweating, fever, difficulty breathing, cough)       Cough and difficulty breathing with moving alot 11. PREGNANCY: "Is there any chance you are pregnant?" "When was your last menstrual period?"       n/a  Protocols used: CHEST PAIN-A-AH

## 2019-03-27 ENCOUNTER — Telehealth: Payer: Self-pay | Admitting: Family Medicine

## 2019-03-27 NOTE — Telephone Encounter (Signed)
Copied from [place] [id number]. Topic: Quick Communication - Rx Refill/Question >> Mar 27, 2019  3:06 PM Sheran Luz wrote: Medication: metFORMIN (GLUCOPHAGE) 1000 MG tablet and glipiZIDE (GLUCOTROL) 5 MG tablet   Patient is requesting refill.   Preferred Pharmacy (with phone number or street name):  [id number] - [place], [place] [phone] (Phone) [phone] (Fax)

## 2019-03-30 ENCOUNTER — Encounter: Payer: Self-pay | Admitting: Gastroenterology

## 2019-03-30 ENCOUNTER — Other Ambulatory Visit: Payer: Self-pay

## 2019-03-30 DIAGNOSIS — E1165 Type 2 diabetes mellitus with hyperglycemia: Secondary | ICD-10-CM

## 2019-03-30 MED ORDER — METFORMIN HCL 1000 MG PO TABS: ORAL_TABLET | ORAL | 0 refills | Status: DC

## 2019-03-30 NOTE — Telephone Encounter (Signed)
Rx has been sent pharmacy.

## 2019-04-01 ENCOUNTER — Emergency Department (HOSPITAL_COMMUNITY): Payer: BC Managed Care – PPO

## 2019-04-01 ENCOUNTER — Other Ambulatory Visit: Payer: Self-pay

## 2019-04-01 ENCOUNTER — Inpatient Hospital Stay (HOSPITAL_COMMUNITY)
Admission: EM | Admit: 2019-04-01 | Discharge: 2019-04-02 | DRG: 247 | Disposition: A | Discharge status: Home / Self Care | Payer: BC Managed Care – PPO | Attending: Internal Medicine | Admitting: Internal Medicine

## 2019-04-01 ENCOUNTER — Inpatient Hospital Stay (HOSPITAL_COMMUNITY)
Admission: EM | Disposition: A | Discharge status: Home / Self Care | Payer: Self-pay | Source: Home / Self Care | Attending: Family Medicine

## 2019-04-01 ENCOUNTER — Observation Stay (HOSPITAL_COMMUNITY): Payer: BC Managed Care – PPO

## 2019-04-01 DIAGNOSIS — Z79899 Other long term (current) drug therapy: Secondary | ICD-10-CM

## 2019-04-01 DIAGNOSIS — Z8249 Family history of ischemic heart disease and other diseases of the circulatory system: Secondary | ICD-10-CM

## 2019-04-01 DIAGNOSIS — Z7984 Long term (current) use of oral hypoglycemic drugs: Secondary | ICD-10-CM

## 2019-04-01 DIAGNOSIS — I2510 Atherosclerotic heart disease of native coronary artery without angina pectoris: Secondary | ICD-10-CM | POA: Diagnosis not present

## 2019-04-01 DIAGNOSIS — R079 Chest pain, unspecified: Secondary | ICD-10-CM | POA: Diagnosis not present

## 2019-04-01 DIAGNOSIS — E1165 Type 2 diabetes mellitus with hyperglycemia: Secondary | ICD-10-CM | POA: Diagnosis not present

## 2019-04-01 DIAGNOSIS — E119 Type 2 diabetes mellitus without complications: Secondary | ICD-10-CM

## 2019-04-01 DIAGNOSIS — R0789 Other chest pain: Secondary | ICD-10-CM | POA: Diagnosis not present

## 2019-04-01 DIAGNOSIS — Z833 Family history of diabetes mellitus: Secondary | ICD-10-CM

## 2019-04-01 DIAGNOSIS — I214 Non-ST elevation (NSTEMI) myocardial infarction: Principal | ICD-10-CM | POA: Diagnosis present

## 2019-04-01 DIAGNOSIS — I351 Nonrheumatic aortic (valve) insufficiency: Secondary | ICD-10-CM

## 2019-04-01 DIAGNOSIS — Z1159 Encounter for screening for other viral diseases: Secondary | ICD-10-CM | POA: Diagnosis not present

## 2019-04-01 DIAGNOSIS — R000 Tachycardia, unspecified: Secondary | ICD-10-CM | POA: Diagnosis not present

## 2019-04-01 DIAGNOSIS — Z955 Presence of coronary angioplasty implant and graft: Secondary | ICD-10-CM

## 2019-04-01 DIAGNOSIS — E785 Hyperlipidemia, unspecified: Secondary | ICD-10-CM | POA: Diagnosis present

## 2019-04-01 DIAGNOSIS — F17220 Nicotine dependence, chewing tobacco, uncomplicated: Secondary | ICD-10-CM | POA: Diagnosis not present

## 2019-04-01 DIAGNOSIS — R9431 Abnormal electrocardiogram [ECG] [EKG]: Secondary | ICD-10-CM | POA: Diagnosis not present

## 2019-04-01 DIAGNOSIS — I10 Essential (primary) hypertension: Secondary | ICD-10-CM | POA: Diagnosis not present

## 2019-04-01 DIAGNOSIS — E1169 Type 2 diabetes mellitus with other specified complication: Secondary | ICD-10-CM | POA: Diagnosis present

## 2019-04-01 HISTORY — PX: LEFT HEART CATH AND CORONARY ANGIOGRAPHY: CATH118249

## 2019-04-01 HISTORY — PX: CORONARY STENT INTERVENTION: CATH118234

## 2019-04-01 HISTORY — DX: Non-ST elevation (NSTEMI) myocardial infarction: I21.4

## 2019-04-01 LAB — BASIC METABOLIC PANEL
Anion gap: 15 (ref 5–15)
BUN: 13 mg/dL (ref 6–20)
CO2: 19 mmol/L — ABNORMAL LOW (ref 22–32)
Calcium: 9.3 mg/dL (ref 8.9–10.3)
Chloride: 102 mmol/L (ref 98–111)
Creatinine, Ser: 0.91 mg/dL (ref 0.61–1.24)
GFR calc Af Amer: >60 mL/min (ref >60)
GFR calc non Af Amer: >60 mL/min (ref >60)
Glucose, Bld: 245 mg/dL — ABNORMAL HIGH (ref 70–99)
Potassium: 3.8 mmol/L (ref 3.5–5.1)
Sodium: 136 mmol/L (ref 135–145)

## 2019-04-01 LAB — CBG MONITORING, ED
Glucose-Capillary: 154 mg/dL — ABNORMAL HIGH (ref 70–99)
Glucose-Capillary: 104 mg/dL — ABNORMAL HIGH (ref 70–99)

## 2019-04-01 LAB — LIPID PANEL
Cholesterol: 272 mg/dL — ABNORMAL HIGH (ref 0–200)
HDL: 54 mg/dL (ref >40)
LDL Cholesterol: 158 mg/dL — ABNORMAL HIGH (ref 0–99)
Total CHOL/HDL Ratio: 5 RATIO
Triglycerides: 301 mg/dL — ABNORMAL HIGH (ref <150)
VLDL: 60 mg/dL — ABNORMAL HIGH (ref 0–40)

## 2019-04-01 LAB — HEMOGLOBIN A1C
Hgb A1c MFr Bld: 7.6 % — ABNORMAL HIGH (ref 4.8–5.6)
Mean Plasma Glucose: 171.42 mg/dL

## 2019-04-01 LAB — TROPONIN I
Troponin I: 0.03 ng/mL (ref <0.03)
Troponin I: 0.48 ng/mL (ref <0.03)
Troponin I: 2.45 ng/mL (ref <0.03)
Troponin I: 3.28 ng/mL (ref <0.03)

## 2019-04-01 LAB — CBC
HCT: 39.8 % (ref 39.0–52.0)
Hemoglobin: 12.9 g/dL — ABNORMAL LOW (ref 13.0–17.0)
MCH: 30 pg (ref 26.0–34.0)
MCHC: 32.4 g/dL (ref 30.0–36.0)
MCV: 92.6 fL (ref 80.0–100.0)
Platelets: 392 10*3/uL (ref 150–400)
RBC: 4.3 MIL/uL (ref 4.22–5.81)
RDW: 14.2 % (ref 11.5–15.5)
WBC: 4.7 10*3/uL (ref 4.0–10.5)
nRBC: 0 % (ref 0.0–0.2)

## 2019-04-01 LAB — GLUCOSE, CAPILLARY: Glucose-Capillary: 181 mg/dL — ABNORMAL HIGH (ref 70–99)

## 2019-04-01 LAB — SARS CORONAVIRUS 2: SARS Coronavirus 2: NOT DETECTED

## 2019-04-01 LAB — POCT ACTIVATED CLOTTING TIME: Activated Clotting Time: 279 seconds

## 2019-04-01 LAB — HEPARIN LEVEL (UNFRACTIONATED): Heparin Unfractionated: 0.36 IU/mL (ref 0.30–0.70)

## 2019-04-01 SURGERY — LEFT HEART CATH AND CORONARY ANGIOGRAPHY
Anesthesia: LOCAL

## 2019-04-01 MED ORDER — MIDAZOLAM HCL 2 MG/2ML IJ SOLN
INTRAMUSCULAR | Status: AC
  Filled 2019-04-01: qty 2

## 2019-04-01 MED ORDER — MIDAZOLAM HCL 2 MG/2ML IJ SOLN
INTRAMUSCULAR | Status: DC | PRN
  Administered 2019-04-01: 1 mg via INTRAVENOUS

## 2019-04-01 MED ORDER — FENTANYL CITRATE (PF) 100 MCG/2ML IJ SOLN
INTRAMUSCULAR | Status: AC
  Filled 2019-04-01: qty 2

## 2019-04-01 MED ORDER — HEPARIN SODIUM (PORCINE) 1000 UNIT/ML IJ SOLN
INTRAMUSCULAR | Status: AC
  Filled 2019-04-01: qty 1

## 2019-04-01 MED ORDER — SODIUM CHLORIDE 0.9% FLUSH: 3.0000 mL | Freq: Two times a day (BID) | INTRAVENOUS | Status: DC

## 2019-04-01 MED ORDER — SODIUM CHLORIDE 0.9% FLUSH: 3.0000 mL | INTRAVENOUS | Status: DC | PRN

## 2019-04-01 MED ORDER — LIDOCAINE HCL (PF) 1 % IJ SOLN
INTRAMUSCULAR | Status: AC
  Filled 2019-04-01: qty 30

## 2019-04-01 MED ORDER — VERAPAMIL HCL 2.5 MG/ML IV SOLN
INTRAVENOUS | Status: AC
  Filled 2019-04-01: qty 2

## 2019-04-01 MED ORDER — SODIUM CHLORIDE 0.9 % WEIGHT BASED INFUSION: 1.0000 mL/kg/h | INTRAVENOUS | Status: DC

## 2019-04-01 MED ORDER — FENTANYL CITRATE (PF) 100 MCG/2ML IJ SOLN
INTRAMUSCULAR | Status: DC | PRN
  Administered 2019-04-01: 25 ug via INTRAVENOUS

## 2019-04-01 MED ORDER — HEPARIN BOLUS VIA INFUSION
4000.0000 [IU] | Freq: Once | INTRAVENOUS | Status: AC
  Administered 2019-04-01: 4000 [IU] via INTRAVENOUS
  Filled 2019-04-01: qty 4000

## 2019-04-01 MED ORDER — ACETAMINOPHEN 325 MG PO TABS: 650.0000 mg | ORAL_TABLET | ORAL | Status: DC | PRN

## 2019-04-01 MED ORDER — HEPARIN (PORCINE) IN NACL 1000-0.9 UT/500ML-% IV SOLN
INTRAVENOUS | Status: AC
  Filled 2019-04-01: qty 500

## 2019-04-01 MED ORDER — METOPROLOL TARTRATE 25 MG PO TABS
25.0000 mg | ORAL_TABLET | Freq: Two times a day (BID) | ORAL | Status: DC
  Administered 2019-04-01 (×2): 25 mg via ORAL
  Filled 2019-04-01 (×2): qty 1

## 2019-04-01 MED ORDER — ROSUVASTATIN CALCIUM 20 MG PO TABS: 20.0000 mg | ORAL_TABLET | Freq: Every day | ORAL | Status: DC

## 2019-04-01 MED ORDER — SODIUM CHLORIDE 0.9 % IV SOLN: 250.0000 mL | INTRAVENOUS | Status: DC | PRN

## 2019-04-01 MED ORDER — TICAGRELOR 90 MG PO TABS
ORAL_TABLET | ORAL | Status: DC | PRN
  Administered 2019-04-01: 180 mg via ORAL

## 2019-04-01 MED ORDER — HEPARIN (PORCINE) IN NACL 1000-0.9 UT/500ML-% IV SOLN
INTRAVENOUS | Status: DC | PRN
  Administered 2019-04-01: 500 mL

## 2019-04-01 MED ORDER — ASPIRIN 81 MG PO CHEW
81.0000 mg | CHEWABLE_TABLET | Freq: Every day | ORAL | Status: DC
  Administered 2019-04-02: 81 mg via ORAL
  Filled 2019-04-01: qty 1

## 2019-04-01 MED ORDER — HEPARIN (PORCINE) 25000 UT/250ML-% IV SOLN
1100.0000 [IU]/h | INTRAVENOUS | Status: DC
  Administered 2019-04-01: 1100 [IU]/h via INTRAVENOUS
  Filled 2019-04-01: qty 250

## 2019-04-01 MED ORDER — SODIUM CHLORIDE 0.9 % WEIGHT BASED INFUSION: 1.0000 mL/kg/h | INTRAVENOUS | Status: AC

## 2019-04-01 MED ORDER — VERAPAMIL HCL 2.5 MG/ML IV SOLN
INTRAVENOUS | Status: DC | PRN
  Administered 2019-04-01: 13:00:00 via INTRA_ARTERIAL

## 2019-04-01 MED ORDER — LIDOCAINE HCL (PF) 1 % IJ SOLN
INTRAMUSCULAR | Status: DC | PRN
  Administered 2019-04-01: 2 mL

## 2019-04-01 MED ORDER — MORPHINE SULFATE (PF) 2 MG/ML IV SOLN: 2.0000 mg | INTRAVENOUS | Status: DC | PRN

## 2019-04-01 MED ORDER — ALUM & MAG HYDROXIDE-SIMETH 200-200-20 MG/5ML PO SUSP: 30.0000 mL | Freq: Four times a day (QID) | ORAL | Status: DC | PRN

## 2019-04-01 MED ORDER — HEPARIN SODIUM (PORCINE) 1000 UNIT/ML IJ SOLN
INTRAMUSCULAR | Status: DC | PRN
  Administered 2019-04-01: 4000 [IU] via INTRAVENOUS
  Administered 2019-04-01: 4500 [IU] via INTRAVENOUS

## 2019-04-01 MED ORDER — TICAGRELOR 90 MG PO TABS
ORAL_TABLET | ORAL | Status: AC
  Filled 2019-04-01: qty 2

## 2019-04-01 MED ORDER — INSULIN ASPART 100 UNIT/ML ~~LOC~~ SOLN: 0.0000 [IU] | Freq: Every day | SUBCUTANEOUS | Status: DC

## 2019-04-01 MED ORDER — TICAGRELOR 90 MG PO TABS
90.0000 mg | ORAL_TABLET | Freq: Two times a day (BID) | ORAL | Status: DC
  Administered 2019-04-02 (×2): 90 mg via ORAL
  Filled 2019-04-01 (×2): qty 1

## 2019-04-01 MED ORDER — NITROGLYCERIN 0.4 MG SL SUBL: 0.4000 mg | SUBLINGUAL_TABLET | SUBLINGUAL | Status: DC | PRN

## 2019-04-01 MED ORDER — NITROGLYCERIN 1 MG/10 ML FOR IR/CATH LAB
INTRA_ARTERIAL | Status: AC
  Filled 2019-04-01: qty 10

## 2019-04-01 MED ORDER — INSULIN ASPART 100 UNIT/ML ~~LOC~~ SOLN: 0.0000 [IU] | SUBCUTANEOUS | Status: DC

## 2019-04-01 MED ORDER — PERFLUTREN LIPID MICROSPHERE
1.0000 mL | INTRAVENOUS | Status: AC | PRN
  Administered 2019-04-01: 2 mL via INTRAVENOUS
  Filled 2019-04-01: qty 10

## 2019-04-01 MED ORDER — ONDANSETRON HCL 4 MG/2ML IJ SOLN: 4.0000 mg | Freq: Four times a day (QID) | INTRAMUSCULAR | Status: DC | PRN

## 2019-04-01 MED ORDER — ASPIRIN 81 MG PO CHEW: 81.0000 mg | CHEWABLE_TABLET | Freq: Once | ORAL | Status: DC

## 2019-04-01 MED ORDER — ROSUVASTATIN CALCIUM 20 MG PO TABS
40.0000 mg | ORAL_TABLET | Freq: Every day | ORAL | Status: DC
  Administered 2019-04-01 – 2019-04-02 (×2): 40 mg via ORAL
  Filled 2019-04-01 (×2): qty 2

## 2019-04-01 MED ORDER — HEPARIN SODIUM (PORCINE) 5000 UNIT/ML IJ SOLN
5000.0000 [IU] | Freq: Three times a day (TID) | INTRAMUSCULAR | Status: DC
  Filled 2019-04-01: qty 1

## 2019-04-01 MED ORDER — SODIUM CHLORIDE 0.9 % WEIGHT BASED INFUSION: 3.0000 mL/kg/h | INTRAVENOUS | Status: DC

## 2019-04-01 MED ORDER — ALBUTEROL SULFATE (2.5 MG/3ML) 0.083% IN NEBU: 2.5000 mg | INHALATION_SOLUTION | Freq: Four times a day (QID) | RESPIRATORY_TRACT | Status: DC | PRN

## 2019-04-01 MED ORDER — INSULIN ASPART 100 UNIT/ML ~~LOC~~ SOLN: 0.0000 [IU] | Freq: Three times a day (TID) | SUBCUTANEOUS | Status: DC

## 2019-04-01 MED ORDER — SODIUM CHLORIDE 0.9% FLUSH: 3.0000 mL | Freq: Once | INTRAVENOUS | Status: DC

## 2019-04-01 MED ORDER — ASPIRIN EC 325 MG PO TBEC: 325.0000 mg | DELAYED_RELEASE_TABLET | Freq: Every day | ORAL | Status: DC

## 2019-04-01 MED ORDER — IOHEXOL 350 MG/ML SOLN
INTRAVENOUS | Status: DC | PRN
  Administered 2019-04-01: 100 mL via INTRA_ARTERIAL

## 2019-04-01 MED ORDER — SODIUM CHLORIDE 0.9 % WEIGHT BASED INFUSION
3.0000 mL/kg/h | INTRAVENOUS | Status: DC
  Administered 2019-04-01: 3 mL/kg/h via INTRAVENOUS

## 2019-04-01 SURGICAL SUPPLY — 18 items
BALLN EUPHORA RX 2.0X12 (BALLOONS) ×2
BALLN ~~LOC~~ EUPHORA RX 3.5X8 (BALLOONS) ×2
BALLOON EUPHORA RX 2.0X12 (BALLOONS) ×1 IMPLANT
BALLOON ~~LOC~~ EUPHORA RX 3.5X8 (BALLOONS) ×1 IMPLANT
CATH 5FR JL3.5 JR4 ANG PIG MP (CATHETERS) ×2 IMPLANT
CATH LAUNCHER 6FR EBU3.5 (CATHETERS) ×2 IMPLANT
DEVICE RAD COMP TR BAND LRG (VASCULAR PRODUCTS) ×2 IMPLANT
GLIDESHEATH SLEND SS 6F .021 (SHEATH) ×2 IMPLANT
GUIDEWIRE INQWIRE 1.5J.035X260 (WIRE) ×1 IMPLANT
INQWIRE 1.5J .035X260CM (WIRE) ×2
KIT ENCORE 26 ADVANTAGE (KITS) ×2 IMPLANT
KIT HEART LEFT (KITS) ×2 IMPLANT
PACK CARDIAC CATHETERIZATION (CUSTOM PROCEDURE TRAY) ×2 IMPLANT
STENT RESOLUTE ONYX 3.5X12 (Permanent Stent) ×2 IMPLANT
SYR MEDRAD MARK 7 150ML (SYRINGE) ×2 IMPLANT
TRANSDUCER W/STOPCOCK (MISCELLANEOUS) ×2 IMPLANT
TUBING CIL FLEX 10 FLL-RA (TUBING) ×2 IMPLANT
WIRE ASAHI PROWATER 180CM (WIRE) ×2 IMPLANT

## 2019-04-01 NOTE — ED Notes (Signed)
Dr. Tamala Julian at bedside, pt requesting to be discharged, MD aware and here to speak with patient regarding plan of care

## 2019-04-01 NOTE — Progress Notes (Signed)
ANTICOAGULATION CONSULT NOTE  Pharmacy Consult:  Heparin Indication: chest pain/ACS  No Known Allergies  Patient Measurements: Height: 5\' 9"  (175.3 cm) Weight: 191 lb 9.3 oz (86.9 kg)(from Nov 2019 records) IBW/kg (Calculated) : 70.7  Vital Signs: Temp: 98.2 F (36.8 C) (06/03 0308) Temp Source: Oral (06/03 0308) BP: 136/87 (06/03 1100) Pulse Rate: 78 (06/03 1100)  Labs: Recent Labs    04/01/19 0318 04/01/19 0544 04/01/19 1119  HGB 12.9*  --   --   HCT 39.8  --   --   PLT 392  --   --   HEPARINUNFRC  --   --  0.36  CREATININE 0.91  --   --   TROPONINI 0.03* 0.48*  --     Estimated Creatinine Clearance: 96.6 mL/min (by C-G formula based on SCr of 0.91 mg/dL).   Assessment: 32 YOM with NSTEMI to continue on IV heparin.  Heparin level therapeutic; no bleeding reported.    Goal of Therapy:  Heparin level 0.3-0.7 units/ml Monitor platelets by anticoagulation protocol: Yes   Plan:  Continue heparin gtt at 1100 units/hr  Daily heparin level and CBC F/U post cath  Nastashia Gallo D. Mina Marble, PharmD, BCPS, [place] 04/01/2019, 12:30 PM

## 2019-04-01 NOTE — ED Triage Notes (Signed)
Pt c/o CP with no radiation that started 20 minutes ago after he ate an egg. 20 ga placed in [place], 324 asa and 1 nitro given by EMS and was a pain level of 7/10 and now 0/10. NAD noted at this time.

## 2019-04-01 NOTE — ED Provider Notes (Signed)
[facility] EMERGENCY DEPARTMENT Provider Note   CSN: [id number] Arrival date & time: 04/01/19  0302    History   Chief Complaint Chief Complaint  Patient presents with  . Chest Pain    HPI Wesley Davila is a 58 y.o. male.     The history is provided by the patient.  Chest Pain  He has history of diabetes and hyperlipidemia and comes in following an episode of chest discomfort at home.  Following dinner, he developed a heavy feeling in the mid and lower sternal area.  This was associated with some pain in both arms.  There is mild associated dyspnea without nausea or diaphoresis.  He has never had symptoms like this before.  He called 911 and, per their instructions, chewed for aspirin tablets.  EMS gave him nitroglycerin with complete relief of discomfort.  He has been pain-free since then.  He denies any recent change in his stamina or exercise tolerance.  He is a non-smoker and denies history of hypertension.  His father did have coronary artery disease, but onset in his 58s.  He denies exposure to anyone with COVID-19.  Past Medical History:  Diagnosis Date  . Diabetes mellitus without complication ([place])   . Hyperlipidemia     Patient Active Problem List   Diagnosis Date Noted  . Left ventricular dysfunction 11/21/2016  . DOE (dyspnea on exertion) 11/21/2016  . Chest pain with moderate risk for cardiac etiology 10/17/2016  . Type 2 diabetes mellitus without complication, without long-term current use of insulin ([place]) 10/17/2016  . Hyperlipidemia LDL goal <70 10/17/2016  . Family history of cardiovascular disease 10/17/2016    Past Surgical History:  Procedure Laterality Date  . NM MYOVIEW LTD  09/2016   Fixed small, mild basal-mid inferior perfusion defect likely diaphragmatic attenuation. No evidence of ischemia. EF 44% with diffuse hypokinesis per computer read, but visually it would appear to be better. Considered intermediate risk.         Home Medications    Prior to Admission medications   Medication Sig Start Date End Date Taking? Authorizing Provider  azithromycin (ZITHROMAX) 250 MG tablet Take 2 tablets on day 1, then 1 tab each day after. For +COVID lung infection. 03/16/19   Forrest Moron, MD  metFORMIN (GLUCOPHAGE) 1000 MG tablet TAKE 1 TABLET BY MOUTH TWICE DAILY WITH A MEAL 03/30/19   Delia Chimes A, MD  ondansetron (ZOFRAN ODT) 4 MG disintegrating tablet Take 1 tablet (4 mg total) by mouth every 8 (eight) hours as needed for nausea or vomiting. 12/10/18   Wieters, Hallie C, PA-C  rosuvastatin (CRESTOR) 20 MG tablet Take 1 tablet (20 mg total) by mouth daily. 09/27/18   Horald Pollen, MD    Family History Family History  Problem Relation Age of Onset  . Heart disease Father 37       MI x 2  . Stroke Father   . Hypertension Brother   . Diabetes Brother   . Hypertension Brother   . Diabetes Brother   . Colon cancer Neg Hx     Social History Social History   Tobacco Use  . Smoking status: Never Smoker  . Smokeless tobacco: Current User    Types: Snuff  . Tobacco comment: since 1980   Substance Use Topics  . Alcohol use: No  . Drug use: No     Allergies   Patient has no known allergies.   Review of Systems Review of Systems  Cardiovascular: Positive for chest pain.  All other systems reviewed and are negative.    Physical Exam Updated Vital Signs BP 127/84   Pulse (!) 125   Temp 98.2 F (36.8 C) (Oral)   Resp 18   SpO2 98%   Physical Exam Vitals signs and nursing note reviewed.    58 year old male, resting comfortably and in no acute distress. Vital signs are significant for rapid heart rate. Oxygen saturation is 98%, which is normal. Head is normocephalic and atraumatic. PERRLA, EOMI. Oropharynx is clear. Neck is nontender and supple without adenopathy or JVD. Back is nontender and there is no CVA tenderness. Lungs are clear without rales, wheezes, or rhonchi. Chest is  nontender. Heart has regular rate and rhythm without murmur. Abdomen is soft, flat, nontender without masses or hepatosplenomegaly and peristalsis is normoactive. Extremities have no cyanosis or edema, full range of motion is present. Skin is warm and dry without rash. Neurologic: Mental status is normal, cranial nerves are intact, there are no motor or sensory deficits.  ED Treatments / Results  Labs (all labs ordered are listed, but only abnormal results are displayed) Labs Reviewed  BASIC METABOLIC PANEL - Abnormal; Notable for the following components:      Result Value   CO2 19 (*)    Glucose, Bld 245 (*)    All other components within normal limits  CBC - Abnormal; Notable for the following components:   Hemoglobin 12.9 (*)    All other components within normal limits  TROPONIN I - Abnormal; Notable for the following components:   Troponin I 0.03 (*)    All other components within normal limits  SARS CORONAVIRUS 2 (HOSPITAL ORDER, [place] LAB)  HEPARIN LEVEL (UNFRACTIONATED)  TROPONIN I  TROPONIN I  TROPONIN I   ECG RONELLE, SMALLMAN VZ:[id number] 01-Apr-2019 03:04:50 [facility]/ED ROUTINE RECORD Sinus tachycardia Minimal voltage criteria for LVH, may be normal variant ST & T wave abnormality, consider inferolateral ischemia Abnormal ECG When compared with ECG of 02/18/2016, HEART RATE has increased ST-t abnormality is now present Confirmed by Delora Fuel ([id number]) on 04/01/2019 4:35:41 AM 56mm/s 84mm/mV 100Hz  9.0.4 12SL 241 HD CID: 45 Confirmed By: Delora Fuel Vent. rate 126 BPM PR interval 142 ms QRS duration 86 ms QT/QTc 320/463 ms P-R-T axes 52 30 -61 1961/05/02 (57 yr) Male Other Room:002C Loc:11 Technician: [id number] Test ind:  EKG EKG Interpretation  Date/Time:  Wednesday April 01 2019 04:54:44 EDT Ventricular Rate:  111 PR Interval:  142 QRS Duration: 82 QT Interval:  333 QTC Calculation: 453 R Axis:   21 Text  Interpretation:  Sinus tachycardia Abnormal R-wave progression, early transition When compared with ECG of EARLIER SAME DATE ST-t abnormality is no longer present Confirmed by Delora Fuel ([id number]) on 04/01/2019 5:08:11 AM   Radiology Dg Chest 2 View  Result Date: 04/01/2019 CLINICAL DATA:  Chest pain. EXAM: CHEST - 2 VIEW COMPARISON:  03/05/2019 FINDINGS: There are prominent interstitial lung markings bilaterally. No pneumothorax. No large pleural effusion. There are a few slightly more focal airspace opacities there is some mild blunting of the costophrenic angles bilaterally. There is no acute osseous abnormality. IMPRESSION: Persistent prominent interstitial lung markings bilaterally which are nonspecific but can be seen in patients with pulmonary edema or an atypical infectious process. Underlying interstitial lung disease is not excluded. A 4-6 week follow-up two-view chest x-ray is recommended to confirm resolution of this finding. Electronically Signed   By: Harrell Gave  Green M.D.   On: 04/01/2019 03:52    Procedures Procedures  CRITICAL CARE Performed by: Delora Fuel Total critical care time: 40 minutes Critical care time was exclusive of separately billable procedures and treating other patients. Critical care was necessary to treat or prevent imminent or life-threatening deterioration. Critical care was time spent personally by me on the following activities: development of treatment plan with patient and/or surrogate as well as nursing, discussions with consultants, evaluation of patient's response to treatment, examination of patient, obtaining history from patient or surrogate, ordering and performing treatments and interventions, ordering and review of laboratory studies, ordering and review of radiographic studies, pulse oximetry and re-evaluation of patient's condition.  Medications Ordered in ED Medications  sodium chloride flush (NS) 0.9 % injection 3 mL (has no administration in  time range)  heparin ADULT infusion 100 units/mL (25000 units/246mL sodium chloride 0.45%) (1,100 Units/hr Intravenous New Bag/Given 04/01/19 0511)  nitroGLYCERIN (NITROSTAT) SL tablet 0.4 mg (has no administration in time range)  metoprolol tartrate (LOPRESSOR) tablet 25 mg (has no administration in time range)  insulin aspart (novoLOG) injection 0-9 Units (has no administration in time range)  heparin bolus via infusion 4,000 Units (4,000 Units Intravenous Bolus from Bag 04/01/19 0511)     Initial Impression / Assessment and Plan / ED Course  I have reviewed the triage vital signs and the nursing notes.  Pertinent labs & imaging results that were available during my care of the patient were reviewed by me and considered in my medical decision making (see chart for details).  Chest discomfort very concerning for angina.  ECG shows new ST and T changes which appear to be ischemic.  Initial troponin is mildly elevated.  He is started on heparin.  Repeat ECG shows resolution of ischemic changes.  Chest x-ray shows some increase in interstitial markings.  Old records are reviewed, and he had been evaluated by cardiology for chest pain with a stress Myoview study in December 2017 which was significant only for reduced ejection fraction.  Follow-up echocardiogram showed normal ejection fraction.  Case is discussed with Dr. Myna Hidalgo of [facility] hospital[facility], who agrees to admit the patient.  Final Clinical Impressions(s) / ED Diagnoses   Final diagnoses:  NSTEMI (non-ST elevated myocardial infarction) [facility])    ED Discharge Orders    None       Delora Fuel, MD 76/19/50 [phone]

## 2019-04-01 NOTE — H&P (View-Only) (Signed)
Cardiology Consultation:   Patient ID: Wesley Davila MRN: [id number]; DOB: 19-Feb-1961  Admit date: 04/01/2019 Date of Consult: 04/01/2019  Primary Care Provider: Forrest Moron, MD Primary Cardiologist: Glenetta Hew, MD  Patient Profile:   Wesley Davila is a 58 y.o. male with a hx of DM, HLD and recent COVID positive test 5/7 (negative 6/3) who is being seen today for the evaluation of NSTEMI at the request of Dr. Tamala Julian.   Previously seen by Dr. Ellyn Hack early 2018 for chest pain. Myoview was intermediate risk study likely due to low EF. No evidence of ischemia. Follow up echo showed LVEF of 55-60%. Unable to determine regional wall motion changes due to rechnically difficult, but relatively normal study.   History of Present Illness:   Wesley Davila tested positive for COVID on 5/7 since than his symptoms has been resolved.   Last night after dinner he was laying in bed and suddenly felt heavy substernal chest pressure. He had some shortness of breath and L arm pain but denies radiation. No diaphoresis, nausea or vomiting. EMS was called, pain resolved with SL nitro x 1. In ER, EKG showed sinus tachycardia at 123 bpm and inferior lateral ST/T changes. No palpitations. Repeat EKG showed improved/resolved st/t changes with better rate. Both personally reviewed.  Troponin 0.03>>0.48. On heparin. No recurrent pain. COVID negative this admission but CXR showed Persistent prominent interstitial lung markings bilaterally which are nonspecific but can be seen in patients with pulmonary edema or an atypical infectious process. Underlying interstitial lung disease is not excluded. Hypertensive on arrival.   He has noted recent exertional fatigue. Walks 15 minutes each day. Some DOE with chest pain. No palpitations, orthopnea, PND, LE edema, melena or syncope. Non smoker and drinker.   Father has hx of CAD in 21s.   Past Medical History:  Diagnosis Date  . Diabetes mellitus without complication  ([place])   . Hyperlipidemia     Past Surgical History:  Procedure Laterality Date  . NM MYOVIEW LTD  09/2016   Fixed small, mild basal-mid inferior perfusion defect likely diaphragmatic attenuation. No evidence of ischemia. EF 44% with diffuse hypokinesis per computer read, but visually it would appear to be better. Considered intermediate risk.    Inpatient Medications: Scheduled Meds: . [START ON 04/02/2019] aspirin EC  325 mg Oral Daily  . insulin aspart  0-5 Units Subcutaneous QHS  . insulin aspart  0-9 Units Subcutaneous TID WC  . metoprolol tartrate  25 mg Oral BID  . rosuvastatin  20 mg Oral Daily  . sodium chloride flush  3 mL Intravenous Once   Continuous Infusions: . heparin 1,100 Units/hr (04/01/19 0511)   PRN Meds: acetaminophen, albuterol, alum & mag hydroxide-simeth, morphine injection, nitroGLYCERIN, ondansetron (ZOFRAN) IV  Allergies:   No Known Allergies  Social History:   Social History   Socioeconomic History  . Marital status: Married    Spouse name: Not on file  . Number of children: 3  . Years of education: Not on file  . Highest education level: Not on file  Occupational History  . Occupation: Agricultural consultant    Comment: Environmental manager  . Financial resource strain: Not on file  . Food insecurity:    Worry: Not on file    Inability: Not on file  . Transportation needs:    Medical: Not on file    Non-medical: Not on file  Tobacco Use  . Smoking status: Never Smoker  . Smokeless tobacco: Current User  Types: Snuff  . Tobacco comment: since 1980   Substance and Sexual Activity  . Alcohol use: No  . Drug use: No  . Sexual activity: Not on file  Lifestyle  . Physical activity:    Days per week: Not on file    Minutes per session: Not on file  . Stress: Not on file  Relationships  . Social connections:    Talks on phone: Not on file    Gets together: Not on file    Attends religious service: Not on file    Active member of club  or organization: Not on file    Attends meetings of clubs or organizations: Not on file    Relationship status: Not on file  . Intimate partner violence:    Fear of current or ex partner: Not on file    Emotionally abused: Not on file    Physically abused: Not on file    Forced sexual activity: Not on file  Other Topics Concern  . Not on file  Social History Narrative   Has 6 brothers, 6 sisters - 61 Brothers with with DM & HTN        Family History:   Family History  Problem Relation Age of Onset  . Heart disease Father 96       MI x 2  . Stroke Father   . Hypertension Brother   . Diabetes Brother   . Hypertension Brother   . Diabetes Brother   . Colon cancer Neg Hx      ROS:  Please see the history of present illness.  All other ROS reviewed and negative.     Physical Exam/Data:   Vitals:   04/01/19 0815 04/01/19 0915 04/01/19 0945 04/01/19 1000  BP: (!) 142/90 136/89 137/90 (!) 143/86  Pulse: 93 80 79 77  Resp: 20 19 19 20   Temp:      TempSrc:      SpO2: 94% 98% 97% 98%  Weight:      Height:       No intake or output data in the 24 hours ending 04/01/19 1010 Last 3 Weights 04/01/2019 03/05/2019 09/26/2018  Weight (lbs) 191 lb 9.3 oz 191 lb 9.3 oz 191 lb 9.6 oz  Weight (kg) 86.9 kg 86.9 kg 86.909 kg     Body mass index is 28.29 kg/m.   See MD Physical exam below  Relevant CV Studies:  Stress test 09/2016  The left ventricular ejection fraction is moderately decreased (30-44%).  Nuclear stress EF: 44%.  Blood pressure demonstrated a normal response to exercise.  There was no ST segment deviation noted during stress.  This is an intermediate risk study.  1. Fixed small, mild basal to mid inferior perfusion defect. Possible diaphragmatic attenuation. No evidence for ischemia. 2. EF 44%, diffuse hypokinesis. Visually appears a bit better than that, would suggest echo.  3. Intermediate risk study due to low EF.   Echo 11/2016 Study Conclusions  -  Procedure narrative: Transthoracic echocardiography. Technically   difficult study with poor echo windows. Intravenous contrast   (Definity) was administered. - Left ventricle: The cavity size was normal. Wall thickness was   normal. Systolic function was normal. The estimated ejection   fraction was in the range of 55% to 60%. The study is not   technically sufficient to allow evaluation of LV diastolic   function. - Left atrium: The atrium was normal in size. - Inferior vena cava: The vessel was normal in size.  The   respirophasic diameter changes were in the normal range (>= 50%),   consistent with normal central venous pressure.  Impressions:  - Technically difficult study, grossly normal LVEF with Definity   contrast, cannot adequate assess for regional wall motion   abnormalities, normal LA size, mildly calcified trileaflet aortic   valve without stenosis, normal IVC.  Laboratory Data:  Chemistry Recent Labs  Lab 04/01/19 0318  NA 136  K 3.8  CL 102  CO2 19*  GLUCOSE 245*  BUN 13  CREATININE 0.91  CALCIUM 9.3  GFRNONAA >60  GFRAA >60  ANIONGAP 15    Hematology Recent Labs  Lab 04/01/19 0318  WBC 4.7  RBC 4.30  HGB 12.9*  HCT 39.8  MCV 92.6  MCH 30.0  MCHC 32.4  RDW 14.2  PLT 392   Cardiac Enzymes Recent Labs  Lab 04/01/19 0318 04/01/19 0544  TROPONINI 0.03* 0.48*   Radiology/Studies:  Dg Chest 2 View  Result Date: 04/01/2019 CLINICAL DATA:  Chest pain. EXAM: CHEST - 2 VIEW COMPARISON:  03/05/2019 FINDINGS: There are prominent interstitial lung markings bilaterally. No pneumothorax. No large pleural effusion. There are a few slightly more focal airspace opacities there is some mild blunting of the costophrenic angles bilaterally. There is no acute osseous abnormality. IMPRESSION: Persistent prominent interstitial lung markings bilaterally which are nonspecific but can be seen in patients with pulmonary edema or an atypical infectious process.  Underlying interstitial lung disease is not excluded. A 4-6 week follow-up two-view chest x-ray is recommended to confirm resolution of this finding. Electronically Signed   By: Constance Holster M.D.   On: 04/01/2019 03:52   Assessment and Plan:   1. NSTEMI -  Troponin 0.03>>0.48. EKG with new ST/T wave changes inferior laterally which improved with better rate. Chest pain resolved with SL nitro x1. No recurrent pain. Cardiac risk factors includes DM and HLD.  - Pending echo this admission.   2. DM - Per primary team  3. HLD - 04/01/2019: Cholesterol 272; HDL 54; LDL Cholesterol 158; Triglycerides 301; VLDL 60  - Increase Crestor to 40mg  daily. If elevated triglycerides persistently consider Vercepa.  4. Elevated BP - no hx of HTN. Improved with addition of BB.   For questions or updates, please contact [place] Please consult [email or web address] for contact info under     Signed, Leanor Kail, PA  04/01/2019 10:10 AM   I have personally seen and examined this patient. I agree with the assessment and plan as outlined above. Pt admitted with chest pain. Risk factors for CAD include FH of CAD, DM and HLD.  Chest pain concerning for unstable angina. Troponin elevated c/w NSTEMI. Labs reviewed and ok. EKG reviewed by me and shows non-specific T wave abn.  My exam: General: Well developed, well nourished, NAD  HEENT: OP clear, mucus membranes moist  SKIN: warm, dry. No rashes. Neuro: No focal deficits  Musculoskeletal: Muscle strength 5/5 all ext  Psychiatric: Mood and affect normal  Neck: No JVD, no carotid bruits, no thyromegaly, no lymphadenopathy.  Lungs:Clear bilaterally, no wheezes, rhonci, crackles Cardiovascular: Regular rate and rhythm. No murmurs, gallops or rubs. Abdomen:Soft. Bowel sounds present. Non-tender.  Extremities: No lower extremity edema. Pulses are 2 + in the bilateral DP/PT.  Plan: NSTEMI: Cardiac cath today with probable PCI.  I have reviewed the  risks, indications, and alternatives to cardiac catheterization, possible angioplasty, and stenting with the patient. Risks include but are not limited to bleeding, infection, vascular injury, stroke, myocardial  infection, arrhythmia, kidney injury, radiation-related injury in the case of prolonged fluoroscopy use, emergency cardiac surgery, and death. The patient understands the risks of serious complication is 1-2 in 7902 with diagnostic cardiac cath and 1-2% or less with angioplasty/stenting.  Lauree Chandler 04/01/2019 11:14 AM

## 2019-04-01 NOTE — Progress Notes (Signed)
ANTICOAGULATION CONSULT NOTE - Initial Consult  Pharmacy Consult for heparin Indication: chest pain/ACS  No Known Allergies  Patient Measurements: Height: 5\' 9"  (175.3 cm) Weight: 191 lb 9.3 oz (86.9 kg)(from Nov 2019 records) IBW/kg (Calculated) : 70.7  Vital Signs: Temp: 98.2 F (36.8 C) (06/03 0308) Temp Source: Oral (06/03 0308) BP: 160/97 (06/03 0500) Pulse Rate: 110 (06/03 0500)  Labs: Recent Labs    04/01/19 0318  HGB 12.9*  HCT 39.8  PLT 392  CREATININE 0.91  TROPONINI 0.03*    Estimated Creatinine Clearance: 96.6 mL/min (by C-G formula based on SCr of 0.91 mg/dL).   Medical History: Past Medical History:  Diagnosis Date  . Diabetes mellitus without complication ([place])   . Hyperlipidemia     Assessment: 58yo male c/p CP relieved after NTG x1, initial troponin elevated, to begin heparin.  Goal of Therapy:  Heparin level 0.3-0.7 units/ml Monitor platelets by anticoagulation protocol: Yes   Plan:  Will give heparin bolus of 4000 units followed by gtt at 1100 units/hr and monitor heparin levels and CBC.  Wynona Neat, PharmD, BCPS  04/01/2019,5:03 AM

## 2019-04-01 NOTE — TOC Benefit Eligibility Note (Signed)
Transition of Care [facility]) Benefit Eligibility Note    Patient Details  Name: Wesley Davila MRN: [id number] Date of Birth: [birth year]   Medication/Dose: Kary Kos   34 MG BID(TICAGRELOR-NON-FORMULARY)  Covered?: Yes     Prescription Coverage Preferred Pharmacy: YES([facility])  Spoke with Person/Company/Phone Number:: [id number] # [phone])  Co-Pay: $ 50.00  Prior Approval: No  Deductible: Unmet       Memory Argue Phone Number: 04/01/2019, 3:12 PM

## 2019-04-01 NOTE — Progress Notes (Signed)
2D Echocardiogram has been performed.  Wesley Davila 04/01/2019, 3:31 PM

## 2019-04-01 NOTE — H&P (Addendum)
History and Physical    Wesley Davila WGN:[id number] DOB: 1961/06/01 DOA: 04/01/2019  Referring MD/NP/PA: Mitzi Hansen, MD PCP: Forrest Moron, MD  Patient coming from: Home via EMS  Chief Complaint: Chest pain  I have personally briefly reviewed patient's old medical records in [place]   HPI: Wesley Davila is a 58 y.o. male with medical history significant of DM type II and HLD; who presents with complaints of chest pain.  Symptoms started after patient had dinner.  He reported having substernal chest discomfort as though someone were pushing on it.  Denies having any nausea, vomiting, diaphoresis, or significant shortness of breath.  He has never had symptoms like this before the past.  He called 911 and took for 81 mg aspirin as advised.  In route with EMS patient was given nitroglycerin with complete resolution of chest pain shortly thereafter.  Denies any fever, cough, heavy lifting, trauma/falls, leg swelling, calf pain, change in vision, lightheadedness, or loss of consciousness.  Patient was diagnosed with COVID-19 diagnosed 5/7, but denies any the symptoms now.  Family history is significant for his dad having heart disease, reports later age of onset.  ED Course: Upon admission into the emergency department patient was seen to be prolonged, heart rates 90-1 25, respirations 15-25, blood pressure elevated up to 160/97, and O2 saturations maintained on room air.  Labs revealed WBC 4.8, hemoglobin 12.9, glucose 245, troponin 0.03-> 0.48.  Prominent interstitial markings which can be seen with edema or atypical lung infections.  COVID-19 screening negative.  EKG initially showed new ST wave changes.  Due to acute changes patient was started on a heparin drip per pharmacy as well as started on metoprolol 25 mg.  Review of Systems  Constitutional: Negative for diaphoresis and fever.  HENT: Negative for congestion and sinus pain.   Eyes: Negative for photophobia and pain.   Respiratory: Negative for cough and wheezing.   Cardiovascular: Positive for chest pain. Negative for leg swelling.  Gastrointestinal: Negative for abdominal pain, nausea and vomiting.  Genitourinary: Negative for dysuria and flank pain.  Musculoskeletal: Negative for back pain and falls.  Skin: Negative for rash.  Neurological: Negative for focal weakness and loss of consciousness.  Psychiatric/Behavioral: Negative for memory loss and substance abuse.    Past Medical History:  Diagnosis Date  . Diabetes mellitus without complication ([place])   . Hyperlipidemia     Past Surgical History:  Procedure Laterality Date  . NM MYOVIEW LTD  09/2016   Fixed small, mild basal-mid inferior perfusion defect likely diaphragmatic attenuation. No evidence of ischemia. EF 44% with diffuse hypokinesis per computer read, but visually it would appear to be better. Considered intermediate risk.     reports that he has never smoked. His smokeless tobacco use includes snuff. He reports that he does not drink alcohol or use drugs.  No Known Allergies  Family History  Problem Relation Age of Onset  . Heart disease Father 59       MI x 2  . Stroke Father   . Hypertension Brother   . Diabetes Brother   . Hypertension Brother   . Diabetes Brother   . Colon cancer Neg Hx     Prior to Admission medications   Medication Sig Start Date End Date Taking? Authorizing Provider  metFORMIN (GLUCOPHAGE) 1000 MG tablet TAKE 1 TABLET BY MOUTH TWICE DAILY WITH A MEAL 03/30/19  Yes Stallings, Zoe A, MD  rosuvastatin (CRESTOR) 20 MG tablet Take 1 tablet (20  mg total) by mouth daily. 09/27/18  Yes Sagardia, Ines Bloomer, MD  azithromycin (ZITHROMAX) 250 MG tablet Take 2 tablets on day 1, then 1 tab each day after. For +COVID lung infection. Patient not taking: Reported on 04/01/2019 03/16/19   Forrest Moron, MD  ondansetron (ZOFRAN ODT) 4 MG disintegrating tablet Take 1 tablet (4 mg total) by mouth every 8 (eight) hours  as needed for nausea or vomiting. Patient not taking: Reported on 04/01/2019 12/10/18   Janith Lima, PA-C    Physical Exam:  Constitutional: Male in NAD, calm, comfortable Vitals:   04/01/19 0615 04/01/19 0630 04/01/19 0645 04/01/19 0700  BP: (!) 139/92 139/85 (!) 141/88 (!) 147/88  Pulse: 90 97 (!) 102 99  Resp: 15 (!) 24 (!) 25 (!) 23  Temp:      TempSrc:      SpO2: 97% 98% 97% 98%  Weight:      Height:       Eyes: PERRL, lids and conjunctivae normal ENMT: Mucous membranes are moist. Posterior pharynx clear of any exudate or lesions.  Neck: normal, supple, no masses, no thyromegaly Respiratory: clear to auscultation bilaterally, no wheezing, no crackles. Normal respiratory effort. No accessory muscle use.  Cardiovascular: Regular rate and rhythm, no murmurs / rubs / gallops. No extremity edema. 2+ pedal pulses. No carotid bruits.  Abdomen: no tenderness, no masses palpated. No hepatosplenomegaly. Bowel sounds positive.  Musculoskeletal: no clubbing / cyanosis. No joint deformity upper and lower extremities. Good ROM, no contractures. Normal muscle tone.  Skin: no rashes, lesions, ulcers. No induration Neurologic: CN 2-12 grossly intact. Sensation intact, DTR normal. Strength 5/5 in all 4.  Psychiatric: Normal judgment and insight. Alert and oriented x 3. Normal mood.     Labs on Admission: I have personally reviewed following labs and imaging studies  CBC: Recent Labs  Lab 04/01/19 0318  WBC 4.7  HGB 12.9*  HCT 39.8  MCV 92.6  PLT 092   Basic Metabolic Panel: Recent Labs  Lab 04/01/19 0318  NA 136  K 3.8  CL 102  CO2 19*  GLUCOSE 245*  BUN 13  CREATININE 0.91  CALCIUM 9.3   GFR: Estimated Creatinine Clearance: 96.6 mL/min (by C-G formula based on SCr of 0.91 mg/dL). Liver Function Tests: No results for input(s): AST, ALT, ALKPHOS, BILITOT, PROT, ALBUMIN in the last 168 hours. No results for input(s): LIPASE, AMYLASE in the last 168 hours. No results  for input(s): AMMONIA in the last 168 hours. Coagulation Profile: No results for input(s): INR, PROTIME in the last 168 hours. Cardiac Enzymes: Recent Labs  Lab 04/01/19 0318 04/01/19 0544  TROPONINI 0.03* 0.48*   BNP (last 3 results) No results for input(s): PROBNP in the last 8760 hours. HbA1C: No results for input(s): HGBA1C in the last 72 hours. CBG: Recent Labs  Lab 04/01/19 0555  GLUCAP 154*   Lipid Profile: No results for input(s): CHOL, HDL, LDLCALC, TRIG, CHOLHDL, LDLDIRECT in the last 72 hours. Thyroid Function Tests: No results for input(s): TSH, T4TOTAL, FREET4, T3FREE, THYROIDAB in the last 72 hours. Anemia Panel: No results for input(s): VITAMINB12, FOLATE, FERRITIN, TIBC, IRON, RETICCTPCT in the last 72 hours. Urine analysis:    Component Value Date/Time   COLORURINE YELLOW 04/15/2016 0312   APPEARANCEUR CLEAR 04/15/2016 0312   LABSPEC 1.029 04/15/2016 0312   PHURINE 6.5 04/15/2016 0312   GLUCOSEU >1000 (A) 04/15/2016 0312   HGBUR NEGATIVE 04/15/2016 0312   BILIRUBINUR negative 04/03/2018 1723   KETONESUR  negative 04/03/2018 1723   KETONESUR 15 (A) 04/15/2016 0312   PROTEINUR negative 04/03/2018 1723   PROTEINUR NEGATIVE 04/15/2016 0312   UROBILINOGEN 0.2 04/03/2018 1723   NITRITE Negative 04/03/2018 1723   NITRITE NEGATIVE 04/15/2016 0312   LEUKOCYTESUR Negative 04/03/2018 1723   Sepsis Labs: Recent Results (from the past 240 hour(s))  SARS Coronavirus 2     Status: None   Collection Time: 04/01/19  5:00 AM  Result Value Ref Range Status   SARS Coronavirus 2 NOT DETECTED NOT DETECTED Final    Comment: (NOTE) SARS-CoV-2 target nucleic acids are NOT DETECTED. The SARS-CoV-2 RNA is generally detectable in upper and lower respiratory specimens during the acute phase of infection.  Negative  results do not preclude SARS-CoV-2 infection, do not rule out co-infections with other pathogens, and should not be used as the sole basis for treatment or  other patient management decisions.  Negative results must be combined with clinical observations, patient history, and epidemiological information. The expected result is Not Detected. Fact Sheet for Patients: http://www.biofiredefense.com/wp-content/uploads/2020/03/BIOFIRE-COVID -19-patients.pdf Fact Sheet for Healthcare Providers: http://www.biofiredefense.com/wp-content/uploads/2020/03/BIOFIRE-COVID -19-hcp.pdf This test is not yet approved or cleared by the [place] and  has been authorized for detection and/or diagnosis of SARS-CoV-2 by FDA under an Emergency Use Authorization (EUA).  This EUA will remain in effec t (meaning this test can be used) for the duration of  the COVID-19 declaration under Section 564(b)(1) of the Act, 21 U.S.C. section 360bbb-3(b)(1), unless the authorization is terminated or revoked sooner. Performed at [facility], [place] [address], [place], [place] [address]      Radiological Exams on Admission: Dg Chest 2 View  Result Date: 04/01/2019 CLINICAL DATA:  Chest pain. EXAM: CHEST - 2 VIEW COMPARISON:  03/05/2019 FINDINGS: There are prominent interstitial lung markings bilaterally. No pneumothorax. No large pleural effusion. There are a few slightly more focal airspace opacities there is some mild blunting of the costophrenic angles bilaterally. There is no acute osseous abnormality. IMPRESSION: Persistent prominent interstitial lung markings bilaterally which are nonspecific but can be seen in patients with pulmonary edema or an atypical infectious process. Underlying interstitial lung disease is not excluded. A 4-6 week follow-up two-view chest x-ray is recommended to confirm resolution of this finding. Electronically Signed   By: Constance Holster M.D.   On: 04/01/2019 03:52    EKG: Independently reviewed.  Sinus tachycardia 126 bpm with ST-T wave changes concerning for inferolateral ischemia  Assessment/Plan NSTEMI (non-ST elevated  myocardial infarction): Acute.  Patient presents with acute onset of substernal chest pressure.  He chewed dose aspirin at home prior to arrival of EMS and pain reportedly resolved after being given nitroglycerin.  Troponin elevated 0.03 -> 0.48.  Patient's initial EKG with ST wave changes concerning for inferolateral ischemia.  Heart score= 6.  Last echocardiogram appears to have been 12/05/2016 where EF was noted to be 55 to 60% with aortic valve calcification, but no signs of stenosis.  Risk factors for disease include hyperlipidemia and diabetes mellitus type 2 possibly uncontrolled -Admit to a telemetry bed -Chest pain order set initiated -N.p.o. for now -Trend cardiac troponins -Heparin drip per pharmacy -ASA -Continue metoprolol 25 mg twice daily -Nitroglycerin, morphine IV, GI cocktail as needed -Cardiology formally consulted, will follow-up for further recommendation  Elevated blood pressure: On admission blood pressure noted to be elevated up to 160/97. No reported history of the same. -Continue to monitor  Diabetes mellitus type 2: On admission patient found to have elevated  blood glucose of 245.  Last hemoglobin A1c 9.3 on 09/26/2018.  Home medications include metformin. -Hypoglycemic protocol -Check hemoglobin A1c -Hold metformin -CBGs q. before meals and at bedtime with sensitive SSI  Hyperlipidemia: Chronic.  Last lipid panel from 09/26/2018 noted total cholesterol 279, HDL 50, LDL 185, and triglycerides 221.  Patient currently Crestor on 20 mg daily.  Goal LDL less than 70. -Recheck lipid panel -Continue Crestor at current dose -May warrant increasing to 40 mg daily  Abnormal chest x-ray: Chest x-ray noted prominent interstitial lung markings concerning for possible edema or atypical infectious process.  Patient denies having any symptoms of shortness of breath or cough. -Consider need repeat chest x-ray in 4 to 6 weeks   DVT prophylaxis: Heparin Code Status: Full Family  Communication: No family present at bedside Disposition Plan: Possibly discharge home in 1 to 3 days Consults called: Cardiology Admission status: Observation  Norval Morton MD [facility] Pager [phone]   If 7PM-7AM, please contact night-coverage [email or web address] Password [email or web address]  04/01/2019, 8:02 AM

## 2019-04-01 NOTE — ED Notes (Signed)
Pt is resting and in no distress.  Gave pt a charge to charge his cell phone.  Pt is pain free and resting.

## 2019-04-01 NOTE — ED Notes (Signed)
Dr. Roxanne Mins notified of Trop 0.03.

## 2019-04-01 NOTE — Consult Note (Signed)
Cardiology Consultation:   Patient ID: Wesley Davila MRN: [id number]; DOB: Aug 31, 1961  Admit date: 04/01/2019 Date of Consult: 04/01/2019  Primary Care Provider: Forrest Moron, MD Primary Cardiologist: Wesley Hew, MD  Patient Profile:   Wesley Davila is a 58 y.o. male with a hx of DM, HLD and recent COVID positive test 5/7 (negative 6/3) who is being seen today for the evaluation of NSTEMI at the request of Dr. Tamala Davila.   Previously seen by Dr. Ellyn Davila early 2018 for chest pain. Myoview was intermediate risk study likely due to low EF. No evidence of ischemia. Follow up echo showed LVEF of 55-60%. Unable to determine regional wall motion changes due to rechnically difficult, but relatively normal study.   History of Present Illness:   Wesley Davila tested positive for COVID on 5/7 since than his symptoms has been resolved.   Last night after dinner he was laying in bed and suddenly felt heavy substernal chest pressure. He had some shortness of breath and L arm pain but denies radiation. No diaphoresis, nausea or vomiting. EMS was called, pain resolved with SL nitro x 1. In ER, EKG showed sinus tachycardia at 123 bpm and inferior lateral ST/T changes. No palpitations. Repeat EKG showed improved/resolved st/t changes with better rate. Both personally reviewed.  Troponin 0.03>>0.48. On heparin. No recurrent pain. COVID negative this admission but CXR showed Persistent prominent interstitial lung markings bilaterally which are nonspecific but can be seen in patients with pulmonary edema or an atypical infectious process. Underlying interstitial lung disease is not excluded. Hypertensive on arrival.   He has noted recent exertional fatigue. Walks 15 minutes each day. Some DOE with chest pain. No palpitations, orthopnea, PND, LE edema, melena or syncope. Non smoker and drinker.   Father has hx of CAD in 53s.   Past Medical History:  Diagnosis Date  . Diabetes mellitus without complication  ([place])   . Hyperlipidemia     Past Surgical History:  Procedure Laterality Date  . NM MYOVIEW LTD  09/2016   Fixed small, mild basal-mid inferior perfusion defect likely diaphragmatic attenuation. No evidence of ischemia. EF 44% with diffuse hypokinesis per computer read, but visually it would appear to be better. Considered intermediate risk.    Inpatient Medications: Scheduled Meds: . [START ON 04/02/2019] aspirin EC  325 mg Oral Daily  . insulin aspart  0-5 Units Subcutaneous QHS  . insulin aspart  0-9 Units Subcutaneous TID WC  . metoprolol tartrate  25 mg Oral BID  . rosuvastatin  20 mg Oral Daily  . sodium chloride flush  3 mL Intravenous Once   Continuous Infusions: . heparin 1,100 Units/hr (04/01/19 0511)   PRN Meds: acetaminophen, albuterol, alum & mag hydroxide-simeth, morphine injection, nitroGLYCERIN, ondansetron (ZOFRAN) IV  Allergies:   No Known Allergies  Social History:   Social History   Socioeconomic History  . Marital status: Married    Spouse name: Not on file  . Number of children: 3  . Years of education: Not on file  . Highest education level: Not on file  Occupational History  . Occupation: Agricultural consultant    Comment: Environmental manager  . Financial resource strain: Not on file  . Food insecurity:    Worry: Not on file    Inability: Not on file  . Transportation needs:    Medical: Not on file    Non-medical: Not on file  Tobacco Use  . Smoking status: Never Smoker  . Smokeless tobacco: Current User  Types: Snuff  . Tobacco comment: since 1980   Substance and Sexual Activity  . Alcohol use: No  . Drug use: No  . Sexual activity: Not on file  Lifestyle  . Physical activity:    Days per week: Not on file    Minutes per session: Not on file  . Stress: Not on file  Relationships  . Social connections:    Talks on phone: Not on file    Gets together: Not on file    Attends religious service: Not on file    Active member of club  or organization: Not on file    Attends meetings of clubs or organizations: Not on file    Relationship status: Not on file  . Intimate partner violence:    Fear of current or ex partner: Not on file    Emotionally abused: Not on file    Physically abused: Not on file    Forced sexual activity: Not on file  Other Topics Concern  . Not on file  Social History Narrative   Has 6 brothers, 6 sisters - 34 Brothers with with DM & HTN        Family History:   Family History  Problem Relation Age of Onset  . Heart disease Father 61       MI x 2  . Stroke Father   . Hypertension Brother   . Diabetes Brother   . Hypertension Brother   . Diabetes Brother   . Colon cancer Neg Hx      ROS:  Please see the history of present illness.  All other ROS reviewed and negative.     Physical Exam/Data:   Vitals:   04/01/19 0815 04/01/19 0915 04/01/19 0945 04/01/19 1000  BP: (!) 142/90 136/89 137/90 (!) 143/86  Pulse: 93 80 79 77  Resp: 20 19 19 20   Temp:      TempSrc:      SpO2: 94% 98% 97% 98%  Weight:      Height:       No intake or output data in the 24 hours ending 04/01/19 1010 Last 3 Weights 04/01/2019 03/05/2019 09/26/2018  Weight (lbs) 191 lb 9.3 oz 191 lb 9.3 oz 191 lb 9.6 oz  Weight (kg) 86.9 kg 86.9 kg 86.909 kg     Body mass index is 28.29 kg/m.   See MD Physical exam below  Relevant CV Studies:  Stress test 09/2016  The left ventricular ejection fraction is moderately decreased (30-44%).  Nuclear stress EF: 44%.  Blood pressure demonstrated a normal response to exercise.  There was no ST segment deviation noted during stress.  This is an intermediate risk study.  1. Fixed small, mild basal to mid inferior perfusion defect. Possible diaphragmatic attenuation. No evidence for ischemia. 2. EF 44%, diffuse hypokinesis. Visually appears a bit better than that, would suggest echo.  3. Intermediate risk study due to low EF.   Echo 11/2016 Study Conclusions  -  Procedure narrative: Transthoracic echocardiography. Technically   difficult study with poor echo windows. Intravenous contrast   (Definity) was administered. - Left ventricle: The cavity size was normal. Wall thickness was   normal. Systolic function was normal. The estimated ejection   fraction was in the range of 55% to 60%. The study is not   technically sufficient to allow evaluation of LV diastolic   function. - Left atrium: The atrium was normal in size. - Inferior vena cava: The vessel was normal in size.  The   respirophasic diameter changes were in the normal range (>= 50%),   consistent with normal central venous pressure.  Impressions:  - Technically difficult study, grossly normal LVEF with Definity   contrast, cannot adequate assess for regional wall motion   abnormalities, normal LA size, mildly calcified trileaflet aortic   valve without stenosis, normal IVC.  Laboratory Data:  Chemistry Recent Labs  Lab 04/01/19 0318  NA 136  K 3.8  CL 102  CO2 19*  GLUCOSE 245*  BUN 13  CREATININE 0.91  CALCIUM 9.3  GFRNONAA >60  GFRAA >60  ANIONGAP 15    Hematology Recent Labs  Lab 04/01/19 0318  WBC 4.7  RBC 4.30  HGB 12.9*  HCT 39.8  MCV 92.6  MCH 30.0  MCHC 32.4  RDW 14.2  PLT 392   Cardiac Enzymes Recent Labs  Lab 04/01/19 0318 04/01/19 0544  TROPONINI 0.03* 0.48*   Radiology/Studies:  Dg Chest 2 View  Result Date: 04/01/2019 CLINICAL DATA:  Chest pain. EXAM: CHEST - 2 VIEW COMPARISON:  03/05/2019 FINDINGS: There are prominent interstitial lung markings bilaterally. No pneumothorax. No large pleural effusion. There are a few slightly more focal airspace opacities there is some mild blunting of the costophrenic angles bilaterally. There is no acute osseous abnormality. IMPRESSION: Persistent prominent interstitial lung markings bilaterally which are nonspecific but can be seen in patients with pulmonary edema or an atypical infectious process.  Underlying interstitial lung disease is not excluded. A 4-6 week follow-up two-view chest x-ray is recommended to confirm resolution of this finding. Electronically Signed   By: Constance Holster M.D.   On: 04/01/2019 03:52   Assessment and Plan:   1. NSTEMI -  Troponin 0.03>>0.48. EKG with new ST/T wave changes inferior laterally which improved with better rate. Chest pain resolved with SL nitro x1. No recurrent pain. Cardiac risk factors includes DM and HLD.  - Pending echo this admission.   2. DM - Per primary team  3. HLD - 04/01/2019: Cholesterol 272; HDL 54; LDL Cholesterol 158; Triglycerides 301; VLDL 60  - Increase Crestor to 40mg  daily. If elevated triglycerides persistently consider Vercepa.  4. Elevated BP - no hx of HTN. Improved with addition of BB.   For questions or updates, please contact [place] Please consult [email or web address] for contact info under     Signed, Leanor Kail, PA  04/01/2019 10:10 AM   I have personally seen and examined this patient. I agree with the assessment and plan as outlined above. Pt admitted with chest pain. Risk factors for CAD include FH of CAD, DM and HLD.  Chest pain concerning for unstable angina. Troponin elevated c/w NSTEMI. Labs reviewed and ok. EKG reviewed by me and shows non-specific T wave abn.  My exam: General: Well developed, well nourished, NAD  HEENT: OP clear, mucus membranes moist  SKIN: warm, dry. No rashes. Neuro: No focal deficits  Musculoskeletal: Muscle strength 5/5 all ext  Psychiatric: Mood and affect normal  Neck: No JVD, no carotid bruits, no thyromegaly, no lymphadenopathy.  Lungs:Clear bilaterally, no wheezes, rhonci, crackles Cardiovascular: Regular rate and rhythm. No murmurs, gallops or rubs. Abdomen:Soft. Bowel sounds present. Non-tender.  Extremities: No lower extremity edema. Pulses are 2 + in the bilateral DP/PT.  Plan: NSTEMI: Cardiac cath today with probable PCI.  I have reviewed the  risks, indications, and alternatives to cardiac catheterization, possible angioplasty, and stenting with the patient. Risks include but are not limited to bleeding, infection, vascular injury, stroke, myocardial  infection, arrhythmia, kidney injury, radiation-related injury in the case of prolonged fluoroscopy use, emergency cardiac surgery, and death. The patient understands the risks of serious complication is 1-2 in 6438 with diagnostic cardiac cath and 1-2% or less with angioplasty/stenting.  Lauree Chandler 04/01/2019 11:14 AM

## 2019-04-01 NOTE — ED Notes (Signed)
Cardiology was in to see pt and states that they will take pt to the cath lab and that he is to stay NPO at this time.

## 2019-04-01 NOTE — Interval H&P Note (Signed)
History and Physical Interval Note:  04/01/2019 12:46 PM  Wesley Davila  has presented today for surgery, with the diagnosis of n stemi.  The various methods of treatment have been discussed with the patient and family. After consideration of risks, benefits and other options for treatment, the patient has consented to  Procedure(s): LEFT HEART CATH AND CORONARY ANGIOGRAPHY (N/A) as a surgical intervention.  The patient's history has been reviewed, patient examined, no change in status, stable for surgery.  I have reviewed the patient's chart and labs.  Questions were answered to the patient's satisfaction.   Cath Lab Visit (complete for each Cath Lab visit)  Clinical Evaluation Leading to the Procedure:   ACS: Yes.    Non-ACS:    Anginal Classification: CCS IV  Anti-ischemic medical therapy: No Therapy  Non-Invasive Test Results: No non-invasive testing performed  Prior CABG: No previous CABG        [name] [facility] 04/01/2019 12:46 PM

## 2019-04-02 ENCOUNTER — Encounter (HOSPITAL_COMMUNITY): Payer: Self-pay | Admitting: Cardiology

## 2019-04-02 LAB — BASIC METABOLIC PANEL
Anion gap: 12 (ref 5–15)
BUN: 7 mg/dL (ref 6–20)
CO2: 22 mmol/L (ref 22–32)
Calcium: 9 mg/dL (ref 8.9–10.3)
Chloride: 102 mmol/L (ref 98–111)
Creatinine, Ser: 0.69 mg/dL (ref 0.61–1.24)
GFR calc Af Amer: >60 mL/min (ref >60)
GFR calc non Af Amer: >60 mL/min (ref >60)
Glucose, Bld: 139 mg/dL — ABNORMAL HIGH (ref 70–99)
Potassium: 3.7 mmol/L (ref 3.5–5.1)
Sodium: 136 mmol/L (ref 135–145)

## 2019-04-02 LAB — GLUCOSE, CAPILLARY
Glucose-Capillary: 120 mg/dL — ABNORMAL HIGH (ref 70–99)
Glucose-Capillary: 132 mg/dL — ABNORMAL HIGH (ref 70–99)

## 2019-04-02 LAB — CBC
HCT: 40.4 % (ref 39.0–52.0)
Hemoglobin: 13.4 g/dL (ref 13.0–17.0)
MCH: 29.6 pg (ref 26.0–34.0)
MCHC: 33.2 g/dL (ref 30.0–36.0)
MCV: 89.4 fL (ref 80.0–100.0)
Platelets: 358 10*3/uL (ref 150–400)
RBC: 4.52 MIL/uL (ref 4.22–5.81)
RDW: 13.9 % (ref 11.5–15.5)
WBC: 6.2 10*3/uL (ref 4.0–10.5)
nRBC: 0 % (ref 0.0–0.2)

## 2019-04-02 LAB — ECHOCARDIOGRAM COMPLETE
Height: 69 in
Weight: 3065.28 oz

## 2019-04-02 LAB — HIV ANTIBODY (ROUTINE TESTING W REFLEX): HIV Screen 4th Generation wRfx: NONREACTIVE

## 2019-04-02 MED ORDER — METOPROLOL TARTRATE 50 MG PO TABS: 50.0000 mg | ORAL_TABLET | Freq: Two times a day (BID) | ORAL | Status: DC

## 2019-04-02 MED ORDER — METOPROLOL TARTRATE 50 MG PO TABS: 50.0000 mg | ORAL_TABLET | Freq: Two times a day (BID) | ORAL | 1 refills | Status: DC

## 2019-04-02 MED ORDER — ASPIRIN 81 MG PO CHEW: 81.0000 mg | CHEWABLE_TABLET | Freq: Every day | ORAL | Status: AC

## 2019-04-02 MED ORDER — ANGIOPLASTY BOOK
Freq: Once | Status: DC
  Filled 2019-04-02: qty 1

## 2019-04-02 MED ORDER — METOPROLOL TARTRATE 50 MG PO TABS
50.0000 mg | ORAL_TABLET | Freq: Two times a day (BID) | ORAL | Status: DC
  Administered 2019-04-02: 50 mg via ORAL
  Filled 2019-04-02: qty 1

## 2019-04-02 MED ORDER — TICAGRELOR 90 MG PO TABS: 90.0000 mg | ORAL_TABLET | Freq: Two times a day (BID) | ORAL | 1 refills | Status: DC

## 2019-04-02 MED ORDER — ROSUVASTATIN CALCIUM 40 MG PO TABS: 40.0000 mg | ORAL_TABLET | Freq: Every day | ORAL | 0 refills | Status: DC

## 2019-04-02 NOTE — Care Management (Signed)
Patient provided with Brilinta coupon and informed that his copay would be $50.

## 2019-04-02 NOTE — Progress Notes (Addendum)
CARDIAC REHAB PHASE I   PRE:  Rate/Rhythm: 116-129 ST  BP:  Supine: 141/88  Sitting:   Standing:    SaO2: 99%RA  MODE:  Ambulation:  In roomft   POST:  Rate/Rhythm: 140 ST 0905-1000 Did not walk with pt as HR elevated. Tried twice in room and to 140 ST. Sitting on edge of bed and was 127. MI education completed with pt who voiced understanding. Reviewed importance of brilinta. Discussed NTG use, MI restrictions, watching carbs and heart healthy food choices, ex ed, CRP 2 [place]. Pt needs brilinta card. Pt is interested in virtual app for ex with CR. Notified cardiology HR too high to walk. Discussed not using snuff. Pt stated he will quit.  Graylon Good, RN BSN  04/02/2019 9:51 AM

## 2019-04-02 NOTE — Progress Notes (Addendum)
Progress Note  Patient Name: Wesley Davila Date of Encounter: 04/02/2019  Primary Cardiologist: Glenetta Hew, MD   Subjective   Feeling well, no complaints except he wasn't able to sleep.   Inpatient Medications    Scheduled Meds: . aspirin  81 mg Oral Daily  . heparin  5,000 Units Subcutaneous Q8H  . insulin aspart  0-5 Units Subcutaneous QHS  . insulin aspart  0-9 Units Subcutaneous TID WC  . metoprolol tartrate  25 mg Oral BID  . rosuvastatin  40 mg Oral Daily  . sodium chloride flush  3 mL Intravenous Once  . sodium chloride flush  3 mL Intravenous Q12H  . sodium chloride flush  3 mL Intravenous Q12H  . ticagrelor  90 mg Oral BID   Continuous Infusions: . sodium chloride    . sodium chloride     PRN Meds: sodium chloride, sodium chloride, acetaminophen, albuterol, alum & mag hydroxide-simeth, morphine injection, nitroGLYCERIN, ondansetron (ZOFRAN) IV, sodium chloride flush, sodium chloride flush   Vital Signs    Vitals:   04/01/19 1339 04/01/19 1400 04/01/19 2216 04/02/19 0349  BP: (!) 143/92 (!) 150/87 (!) 157/91 (!) 156/98  Pulse: 84 87 (!) 106 (!) 106  Resp: 19 (!) 21 (!) 24 (!) 23  Temp:  98.3 F (36.8 C) 98.3 F (36.8 C) 98.3 F (36.8 C)  TempSrc:  Oral Oral Oral  SpO2: 100% 100% 98% 100%  Weight:    78.2 kg  Height:        Intake/Output Summary (Last 24 hours) at 04/02/2019 0931 Last data filed at 04/02/2019 0837 Gross per 24 hour  Intake 652.78 ml  Output 3250 ml  Net -2597.22 ml   Last 3 Weights 04/02/2019 04/01/2019 03/05/2019  Weight (lbs) 172 lb 6.4 oz 191 lb 9.3 oz 191 lb 9.3 oz  Weight (kg) 78.2 kg 86.9 kg 86.9 kg      Telemetry    ST - Personally Reviewed  ECG    ST - Personally Reviewed  Physical Exam   GEN: No acute distress.   Neck: No JVD Cardiac: RRR, no murmurs, rubs, or gallops.  Respiratory: Clear to auscultation bilaterally. GI: Soft, nontender, non-distended  MS: No edema; No deformity. Right radial site stable.   Neuro:  Nonfocal  Psych: Normal affect   Labs    Chemistry Recent Labs  Lab 04/01/19 0318 04/02/19 0408  NA 136 136  K 3.8 3.7  CL 102 102  CO2 19* 22  GLUCOSE 245* 139*  BUN 13 7  CREATININE 0.91 0.69  CALCIUM 9.3 9.0  GFRNONAA >60 >60  GFRAA >60 >60  ANIONGAP 15 12     Hematology Recent Labs  Lab 04/01/19 0318 04/02/19 0408  WBC 4.7 6.2  RBC 4.30 4.52  HGB 12.9* 13.4  HCT 39.8 40.4  MCV 92.6 89.4  MCH 30.0 29.6  MCHC 32.4 33.2  RDW 14.2 13.9  PLT 392 358    Cardiac Enzymes Recent Labs  Lab 04/01/19 0318 04/01/19 0544 04/01/19 1119 04/01/19 1705  TROPONINI 0.03* 0.48* 2.45* 3.28*   No results for input(s): TROPIPOC in the last 168 hours.   BNPNo results for input(s): BNP, PROBNP in the last 168 hours.   DDimer No results for input(s): DDIMER in the last 168 hours.   Radiology    Dg Chest 2 View  Result Date: 04/01/2019 CLINICAL DATA:  Chest pain. EXAM: CHEST - 2 VIEW COMPARISON:  03/05/2019 FINDINGS: There are prominent interstitial lung markings bilaterally. No pneumothorax.  No large pleural effusion. There are a few slightly more focal airspace opacities there is some mild blunting of the costophrenic angles bilaterally. There is no acute osseous abnormality. IMPRESSION: Persistent prominent interstitial lung markings bilaterally which are nonspecific but can be seen in patients with pulmonary edema or an atypical infectious process. Underlying interstitial lung disease is not excluded. A 4-6 week follow-up two-view chest x-ray is recommended to confirm resolution of this finding. Electronically Signed   By: Constance Holster M.D.   On: 04/01/2019 03:52    Cardiac Studies   Cath: 04/01/19   Dist LAD lesion is 60% stenosed.  Prox Cx to Mid Cx lesion is 90% stenosed.  Post intervention, there is a 0% residual stenosis.  A drug-eluting stent was successfully placed using a STENT RESOLUTE ONYX 3.5X12.  The left ventricular systolic function is  normal.  LV end diastolic pressure is normal.  The left ventricular ejection fraction is 55-65% by visual estimate.   1. Single vessel obstructive CAD 2. Normal LV function 3. Normal LV filling pressure 4. Successful PCI of the mid LCx with DES x 1  Plan: DAPT for one year. Risk factor modification. Anticipate DC tomorrow.   Diagnostic  Dominance: Right    Intervention    TTE: 04/01/19  IMPRESSIONS    1. The left ventricle has normal systolic function, with an ejection fraction of 55-60%. The cavity size was normal. Left ventricular diastolic Doppler parameters are consistent with impaired relaxation. No evidence of left ventricular regional wall  motion abnormalities.  2. The right ventricle has normal systolic function. The cavity was normal. There is no increase in right ventricular wall thickness. Right ventricular systolic pressure could not be assessed.  3. The aortic valve is tricuspid. Mild sclerosis of the aortic valve. Aortic valve regurgitation is mild to moderate by color flow Doppler.  Patient Profile     58 y.o. male of DM, HLD and recent COVID positive test 5/7 (negative 6/3) who was seen for the evaluation of NSTEMI.   Assessment & Plan    1. NSTEMI: Troponin peaked at 3.28. Underwent cardiac cath yesterday noted above with PCI/DES x1 to the mLCx. Placed on DAPT with ASA/Brilinta for at least one year. No chest pain post cath. -- continue ASA, Brilinta, BB (increased this morning), statin  2. DM: - Per primary team. On metformin PTA.  3. HLD: - 04/01/2019: Cholesterol 272; HDL 54; LDL Cholesterol 158; Triglycerides 301; VLDL 60  - Increased Crestor to 40mg  daily. If elevated triglycerides persistently consider Vercepa. - FLP/LFTs in 6-8 weeks  4. Elevated BP: BB increase this morning.   [facility] will sign off.   Medication Recommendations:  ASA, Brilinta, BB, statin Other recommendations (labs, testing, etc):  none Follow up as an outpatient:   Will arrange for outpatient TOC f/u  For questions or updates, please contact [place] Please consult [email or web address] for contact info under        Signed, Reino Bellis, NP  04/02/2019, 9:31 AM    I have personally seen and examined this patient. I agree with the assessment and plan as outlined above.  Admitted with NSTEMI. Cardiac cath 04/01/19 with severe stenosis circumflex treated with a DES. OK for discharge today on ASA/Brilinta (one year), statin and beta blocker. We will arrange f/u with Dr. Ellyn Hack.   Lauree Chandler 04/02/2019 10:22 AM

## 2019-04-02 NOTE — Discharge Summary (Signed)
Physician Discharge Summary  Wesley Davila XTG:[id number] DOB: 06/06/1961 DOA: 04/01/2019  PCP: Forrest Moron, MD  Admit date: 04/01/2019 Discharge date: 04/02/2019  Admitted From: Home Disposition: Home Recommendations for Outpatient Follow-up:  1. Follow up with PCP in 1-2 weeks 2. Please obtain BMP/CBC in one week 3. Follow-up with cardiologist 4. Check LFTs and lipid profile in 6 to 8 weeks.   Home Health none Equipment/Devices none Discharge Condition stable CODE STATUS full code Diet recommendation: Cardiac  Brief/Interim Summary:58 y.o. male with medical history significant of DM type II and HLD; who presents with complaints of chest pain.  Symptoms started after patient had dinner.  He reported having substernal chest discomfort as though someone were pushing on it.  Denies having any nausea, vomiting, diaphoresis, or significant shortness of breath.  He has never had symptoms like this before the past.  He called 911 and took for 81 mg aspirin as advised.  In route with EMS patient was given nitroglycerin with complete resolution of chest pain shortly thereafter.  Denies any fever, cough, heavy lifting, trauma/falls, leg swelling, calf pain, change in vision, lightheadedness, or loss of consciousness.  Patient was diagnosed with COVID-19 diagnosed 5/7, but denies any the symptoms now.  Family history is significant for his dad having heart disease, reports later age of onset.  ED Course: Upon admission into the emergency department patient was seen to be prolonged, heart rates 90-1 25, respirations 15-25, blood pressure elevated up to 160/97, and O2 saturations maintained on room air.  Labs revealed WBC 4.8, hemoglobin 12.9, glucose 245, troponin 0.03-> 0.48.  Prominent interstitial markings which can be seen with edema or atypical lung infections.  COVID-19 screening negative.  EKG initially showed new ST wave changes.  Due to acute changes patient was started on a heparin drip per  pharmacy as well as started on metoprolol 25 mg.    Discharge Diagnoses:  Principal Problem:   NSTEMI (non-ST elevated myocardial infarction) ([place]) Active Problems:   Type 2 diabetes mellitus without complication, without long-term current use of insulin ([place])   Hyperlipidemia LDL goal <70  #1 NSTEMI patient status post cath with one-vessel disease PCI/DES x1.  Patient has been started on Brilinta along with aspirin continue this for at least 1 year.  Continue beta-blocker and statin.  Patient will follow-up with cardiology as an outpatient.  #2 type 2 diabetes continue metformin  #3 hyperlipidemia continue Crestor the dose of Crestor is increased to 40 mg during this admission.  Cardiology recommended if triglycerides stays up to consider restarting her vercepa.  Follow-up LFTs and lipid profile in 6 to 8 weeks.  #4 hypertension continue the increased dose of beta-blocker 50 mg twice a day metoprolol.     Estimated body mass index is 25.46 kg/m as calculated from the following:   Height as of this encounter: 5\' 9"  (1.753 m).   Weight as of this encounter: 78.2 kg.  Discharge Instructions  Discharge Instructions    Amb Referral to Cardiac Rehabilitation   Complete by:  As directed    Diagnosis:   NSTEMI Coronary Stents     After initial evaluation and assessments completed: Virtual Based Care may be provided alone or in conjunction with Phase 2 Cardiac Rehab based on patient barriers.:  Yes   Call MD for:  difficulty breathing, headache or visual disturbances   Complete by:  As directed    Call MD for:  persistant dizziness or light-headedness   Complete by:  As directed  Call MD for:  persistant nausea and vomiting   Complete by:  As directed    Call MD for:  redness, tenderness, or signs of infection (pain, swelling, redness, odor or green/yellow discharge around incision site)   Complete by:  As directed    Call MD for:  severe uncontrolled pain   Complete by:  As  directed    Diet - low sodium heart healthy   Complete by:  As directed    Increase activity slowly   Complete by:  As directed      Allergies as of 04/02/2019   No Known Allergies     Medication List    STOP taking these medications   azithromycin 250 MG tablet Commonly known as:  ZITHROMAX   ondansetron 4 MG disintegrating tablet Commonly known as:  Zofran ODT     TAKE these medications   aspirin 81 MG chewable tablet Chew 1 tablet (81 mg total) by mouth daily.   metFORMIN 1000 MG tablet Commonly known as:  GLUCOPHAGE TAKE 1 TABLET BY MOUTH TWICE DAILY WITH A MEAL   metoprolol tartrate 50 MG tablet Commonly known as:  LOPRESSOR Take 1 tablet (50 mg total) by mouth 2 (two) times daily.   rosuvastatin 40 MG tablet Commonly known as:  Crestor Take 1 tablet (40 mg total) by mouth daily. What changed:    medication strength  how much to take   ticagrelor 90 MG Tabs tablet Commonly known as:  BRILINTA Take 1 tablet (90 mg total) by mouth 2 (two) times daily.      Follow-up Information    Forrest Moron, MD Follow up.   Specialty:  Internal Medicine Contact information: [address] [place] [address] [phone]        Leonie Man, MD .   Specialty:  Cardiology Contact information: [address] [place] [address] [phone]          No Known Allergies  Consultations: Cardiology  Procedures/Studies: Dg Chest 2 View  Result Date: 04/01/2019 CLINICAL DATA:  Chest pain. EXAM: CHEST - 2 VIEW COMPARISON:  03/05/2019 FINDINGS: There are prominent interstitial lung markings bilaterally. No pneumothorax. No large pleural effusion. There are a few slightly more focal airspace opacities there is some mild blunting of the costophrenic angles bilaterally. There is no acute osseous abnormality. IMPRESSION: Persistent prominent interstitial lung markings bilaterally which are nonspecific but can be seen in patients with pulmonary  edema or an atypical infectious process. Underlying interstitial lung disease is not excluded. A 4-6 week follow-up two-view chest x-ray is recommended to confirm resolution of this finding. Electronically Signed   By: Constance Holster M.D.   On: 04/01/2019 03:52   Dg Chest Port 1 View  Result Date: 03/05/2019 CLINICAL DATA:  Cough EXAM: PORTABLE CHEST 1 VIEW COMPARISON:  02/18/2016 FINDINGS: The cardiac silhouette is mildly enlarged. The lung volumes are low. There are prominent interstitial lung markings bilaterally. There is no pneumothorax. There is some blunting of the costophrenic angles bilaterally. IMPRESSION: 1. Low lung volumes. Prominent interstitial lung markings bilaterally which can be seen in patients with pulmonary edema versus an atypical infectious process. 2. Bibasilar airspace opacities concerning for atelectasis or developing infiltrates. 3. Cardiomegaly.  Probable trace bilateral pleural effusions. Electronically Signed   By: Constance Holster M.D.   On: 03/05/2019 20:17    (Echo, Carotid, EGD, Colonoscopy, ERCP)    Subjective: Patient resting in bed anxious to go home no further chest pain or  shortness of breath  Discharge Exam: Vitals:   04/01/19 2216 04/02/19 0349  BP: (!) 157/91 (!) 156/98  Pulse: (!) 106 (!) 106  Resp: (!) 24 (!) 23  Temp: 98.3 F (36.8 C) 98.3 F (36.8 C)  SpO2: 98% 100%   Vitals:   04/01/19 1339 04/01/19 1400 04/01/19 2216 04/02/19 0349  BP: (!) 143/92 (!) 150/87 (!) 157/91 (!) 156/98  Pulse: 84 87 (!) 106 (!) 106  Resp: 19 (!) 21 (!) 24 (!) 23  Temp:  98.3 F (36.8 C) 98.3 F (36.8 C) 98.3 F (36.8 C)  TempSrc:  Oral Oral Oral  SpO2: 100% 100% 98% 100%  Weight:    78.2 kg  Height:        General: Pt is alert, awake, not in acute distress Cardiovascular: RRR, S1/S2 +, no rubs, no gallops Respiratory: CTA bilaterally, no wheezing, no rhonchi Abdominal: Soft, NT, ND, bowel sounds + Extremities: no edema, no cyanosis    The  results of significant diagnostics from this hospitalization (including imaging, microbiology, ancillary and laboratory) are listed below for reference.     Microbiology: Recent Results (from the past 240 hour(s))  SARS Coronavirus 2     Status: None   Collection Time: 04/01/19  5:00 AM  Result Value Ref Range Status   SARS Coronavirus 2 NOT DETECTED NOT DETECTED Final    Comment: (NOTE) SARS-CoV-2 target nucleic acids are NOT DETECTED. The SARS-CoV-2 RNA is generally detectable in upper and lower respiratory specimens during the acute phase of infection.  Negative  results do not preclude SARS-CoV-2 infection, do not rule out co-infections with other pathogens, and should not be used as the sole basis for treatment or other patient management decisions.  Negative results must be combined with clinical observations, patient history, and epidemiological information. The expected result is Not Detected. Fact Sheet for Patients: http://www.biofiredefense.com/wp-content/uploads/2020/03/BIOFIRE-COVID -19-patients.pdf Fact Sheet for Healthcare Providers: http://www.biofiredefense.com/wp-content/uploads/2020/03/BIOFIRE-COVID -19-hcp.pdf This test is not yet approved or cleared by the [place] and  has been authorized for detection and/or diagnosis of SARS-CoV-2 by FDA under an Emergency Use Authorization (EUA).  This EUA will remain in effec t (meaning this test can be used) for the duration of  the COVID-19 declaration under Section 564(b)(1) of the Act, 21 U.S.C. section 360bbb-3(b)(1), unless the authorization is terminated or revoked sooner. Performed at [facility], [place] [address], [place], [place] [address]      Labs: BNP (last 3 results) No results for input(s): BNP in the last 8760 hours. Basic Metabolic Panel: Recent Labs  Lab 04/01/19 0318 04/02/19 0408  NA 136 136  K 3.8 3.7  CL 102 102  CO2 19* 22  GLUCOSE 245* 139*  BUN 13 7  CREATININE 0.91  0.69  CALCIUM 9.3 9.0   Liver Function Tests: No results for input(s): AST, ALT, ALKPHOS, BILITOT, PROT, ALBUMIN in the last 168 hours. No results for input(s): LIPASE, AMYLASE in the last 168 hours. No results for input(s): AMMONIA in the last 168 hours. CBC: Recent Labs  Lab 04/01/19 0318 04/02/19 0408  WBC 4.7 6.2  HGB 12.9* 13.4  HCT 39.8 40.4  MCV 92.6 89.4  PLT 392 358   Cardiac Enzymes: Recent Labs  Lab 04/01/19 0318 04/01/19 0544 04/01/19 1119 04/01/19 1705  TROPONINI 0.03* 0.48* 2.45* 3.28*   BNP: Invalid input(s): POCBNP CBG: Recent Labs  Lab 04/01/19 0555 04/01/19 0751 04/01/19 1712 04/01/19 2233 04/02/19 0812  GLUCAP 154* 104* 181* 120* 132*  D-Dimer No results for input(s): DDIMER in the last 72 hours. Hgb A1c Recent Labs    04/01/19 0319  HGBA1C 7.6*   Lipid Profile Recent Labs    04/01/19 0318  CHOL 272*  HDL 54  LDLCALC 158*  TRIG 301*  CHOLHDL 5.0   Thyroid function studies No results for input(s): TSH, T4TOTAL, T3FREE, THYROIDAB in the last 72 hours.  Invalid input(s): FREET3 Anemia work up No results for input(s): VITAMINB12, FOLATE, FERRITIN, TIBC, IRON, RETICCTPCT in the last 72 hours. Urinalysis    Component Value Date/Time   COLORURINE YELLOW 04/15/2016 0312   APPEARANCEUR CLEAR 04/15/2016 0312   LABSPEC 1.029 04/15/2016 0312   PHURINE 6.5 04/15/2016 0312   GLUCOSEU >1000 (A) 04/15/2016 0312   HGBUR NEGATIVE 04/15/2016 0312   BILIRUBINUR negative 04/03/2018 1723   KETONESUR negative 04/03/2018 1723   KETONESUR 15 (A) 04/15/2016 0312   PROTEINUR negative 04/03/2018 1723   PROTEINUR NEGATIVE 04/15/2016 0312   UROBILINOGEN 0.2 04/03/2018 1723   NITRITE Negative 04/03/2018 1723   NITRITE NEGATIVE 04/15/2016 0312   LEUKOCYTESUR Negative 04/03/2018 1723   Sepsis Labs Invalid input(s): PROCALCITONIN,  WBC,  LACTICIDVEN Microbiology Recent Results (from the past 240 hour(s))  SARS Coronavirus 2     Status: None    Collection Time: 04/01/19  5:00 AM  Result Value Ref Range Status   SARS Coronavirus 2 NOT DETECTED NOT DETECTED Final    Comment: (NOTE) SARS-CoV-2 target nucleic acids are NOT DETECTED. The SARS-CoV-2 RNA is generally detectable in upper and lower respiratory specimens during the acute phase of infection.  Negative  results do not preclude SARS-CoV-2 infection, do not rule out co-infections with other pathogens, and should not be used as the sole basis for treatment or other patient management decisions.  Negative results must be combined with clinical observations, patient history, and epidemiological information. The expected result is Not Detected. Fact Sheet for Patients: http://www.biofiredefense.com/wp-content/uploads/2020/03/BIOFIRE-COVID -19-patients.pdf Fact Sheet for Healthcare Providers: http://www.biofiredefense.com/wp-content/uploads/2020/03/BIOFIRE-COVID -19-hcp.pdf This test is not yet approved or cleared by the [place] and  has been authorized for detection and/or diagnosis of SARS-CoV-2 by FDA under an Emergency Use Authorization (EUA).  This EUA will remain in effec t (meaning this test can be used) for the duration of  the COVID-19 declaration under Section 564(b)(1) of the Act, 21 U.S.C. section 360bbb-3(b)(1), unless the authorization is terminated or revoked sooner. Performed at [facility], [place] [address] [place], [place] [address]      Time coordinating discharge: 34  minutes  SIGNED:   Georgette Shell, MD  [facility] 04/02/2019, 10:52 AM Pager   If 7PM-7AM, please contact night-coverage [email or web address] Password [email or web address]

## 2019-04-03 MED FILL — Nitroglycerin IV Soln 100 MCG/ML in D5W: INTRA_ARTERIAL | Qty: 10 | Status: AC

## 2019-04-05 DIAGNOSIS — Z9861 Coronary angioplasty status: Secondary | ICD-10-CM

## 2019-04-05 DIAGNOSIS — I2510 Atherosclerotic heart disease of native coronary artery without angina pectoris: Secondary | ICD-10-CM

## 2019-04-05 HISTORY — DX: Atherosclerotic heart disease of native coronary artery without angina pectoris: I25.10

## 2019-04-05 HISTORY — DX: Atherosclerotic heart disease of native coronary artery without angina pectoris: Z98.61

## 2019-04-06 ENCOUNTER — Telehealth: Payer: Self-pay | Admitting: Family Medicine

## 2019-04-06 NOTE — Telephone Encounter (Signed)
Pt dropped off FMLA paper work for Dr. Nolon Rod to complete He has an appt with Dr.Stallings on Wednesday at 8:20 06.10.2020 put ppr work in provider box at [facility]

## 2019-04-07 ENCOUNTER — Telehealth: Payer: Self-pay

## 2019-04-07 ENCOUNTER — Other Ambulatory Visit: Payer: Self-pay

## 2019-04-07 NOTE — Telephone Encounter (Signed)
Patient was scheduled for Pre-Visit 04/07/19 @ 10:00 Am. I noticed in the patients chart that he was recently admitted to the hospital for chest pain and on 04/01/19 he had a left heart cath. Patient was placed on Brilinta. Per [place] guidelines patient needs to wait 6 months before having a colonoscopy. Patient was informed and agreed that he would prefer to wait. Patient was told to call us and reschedule for 6 months past his heart procedure. He as also told that he will need cardiac clearance from his cardiologist prior to his colonoscopy. Colonoscopy will be cancelled and patient verbalizes understanding to call back and reschedule.   Riki Sheer, LPN ( [facility] )

## 2019-04-08 ENCOUNTER — Other Ambulatory Visit: Payer: Self-pay

## 2019-04-08 ENCOUNTER — Telehealth: Payer: Self-pay | Admitting: Adult Health

## 2019-04-08 ENCOUNTER — Encounter: Payer: Self-pay | Admitting: Family Medicine

## 2019-04-08 ENCOUNTER — Ambulatory Visit (INDEPENDENT_AMBULATORY_CARE_PROVIDER_SITE_OTHER): Payer: BC Managed Care – PPO | Admitting: Family Medicine

## 2019-04-08 VITALS — BP 118/80 | HR 78 | Temp 98.1°F | Resp 18 | Ht 69.0 in | Wt 172.0 lb

## 2019-04-08 DIAGNOSIS — Z8619 Personal history of other infectious and parasitic diseases: Secondary | ICD-10-CM

## 2019-04-08 DIAGNOSIS — E1159 Type 2 diabetes mellitus with other circulatory complications: Secondary | ICD-10-CM

## 2019-04-08 DIAGNOSIS — I214 Non-ST elevation (NSTEMI) myocardial infarction: Secondary | ICD-10-CM

## 2019-04-08 DIAGNOSIS — Z8616 Personal history of COVID-19: Secondary | ICD-10-CM

## 2019-04-08 MED ORDER — ZOLPIDEM TARTRATE 5 MG PO TABS: 5.0000 mg | ORAL_TABLET | Freq: Every evening | ORAL | 1 refills | Status: DC | PRN

## 2019-04-08 NOTE — Progress Notes (Signed)
Established Patient Office Visit  Subjective:  Patient ID: Wesley Davila, male    DOB: 1961-10-19  Age: 58 y.o. MRN: [id number]  CC:  Chief Complaint  Patient presents with  . Hospitalization Follow-up    follow up from 6/3 pt was in the ER for MI    HPI Wesley Davila presents for   Pt works in Psychologist, educational at an [employer] He states that his energy is very low with poor appetite and was not eating  He tested positive for COVID 03/05/2019 and then developed chest pains and was diagnosed with a NSTEMI 04/02/2019 He reports that he currently has chest pain in the midsternal region but it is feeling better than before his hospitalization.  He states that has anxiety and cannot sleep.  Depression screen [facility] 2/9 04/08/2019 03/16/2019 09/26/2018 04/03/2018 10/31/2017  Decreased Interest 0 0 0 0 0  Down, Depressed, Hopeless 0 0 0 0 0  PHQ - 2 Score 0 0 0 0 0   GAD 7 : Generalized Anxiety Score 04/08/2019  Nervous, Anxious, on Edge 0  Control/stop worrying 0  Worry too much - different things 1  Trouble relaxing 3  Restless 0  Easily annoyed or irritable 1  Afraid - awful might happen 0  Total GAD 7 Score 5  Anxiety Difficulty Not difficult at all     Wt Readings from Last 3 Encounters:  04/08/19 172 lb (78 kg)  04/02/19 172 lb 6.4 oz (78.2 kg)  03/05/19 191 lb 9.3 oz (86.9 kg)    He is on a beta blocker, statin, aspirin, brilinta and metformin for his diabetes    Past Medical History:  Diagnosis Date  . Diabetes mellitus without complication ([place])   . Hyperlipidemia     Past Surgical History:  Procedure Laterality Date  . CORONARY STENT INTERVENTION N/A 04/01/2019   Procedure: CORONARY STENT INTERVENTION;  Surgeon: Martinique, Peter M, MD;  Location: [facility];  Service: Cardiovascular;  Laterality: N/A;  . LEFT HEART CATH AND CORONARY ANGIOGRAPHY N/A 04/01/2019   Procedure: LEFT HEART CATH AND CORONARY ANGIOGRAPHY;  Surgeon: Martinique, Peter M, MD;  Location: [facility];  Service: Cardiovascular;  Laterality: N/A;  . NM MYOVIEW LTD  09/2016   Fixed small, mild basal-mid inferior perfusion defect likely diaphragmatic attenuation. No evidence of ischemia. EF 44% with diffuse hypokinesis per computer read, but visually it would appear to be better. Considered intermediate risk.    Family History  Problem Relation Age of Onset  . Heart disease Father 88       MI x 2  . Stroke Father   . Hypertension Brother   . Diabetes Brother   . Hypertension Brother   . Diabetes Brother   . Colon cancer Neg Hx     Social History   Socioeconomic History  . Marital status: Married    Spouse name: Not on file  . Number of children: 3  . Years of education: Not on file  . Highest education level: Not on file  Occupational History  . Occupation: Agricultural consultant    Comment: Environmental manager  . Financial resource strain: Not on file  . Food insecurity:    Worry: Not on file    Inability: Not on file  . Transportation needs:    Medical: Not on file    Non-medical: Not on file  Tobacco Use  . Smoking status: Never Smoker  . Smokeless tobacco: Current User  Types: Snuff  . Tobacco comment: since 1980   Substance and Sexual Activity  . Alcohol use: No  . Drug use: No  . Sexual activity: Not on file  Lifestyle  . Physical activity:    Days per week: Not on file    Minutes per session: Not on file  . Stress: Not on file  Relationships  . Social connections:    Talks on phone: Not on file    Gets together: Not on file    Attends religious service: Not on file    Active member of club or organization: Not on file    Attends meetings of clubs or organizations: Not on file    Relationship status: Not on file  . Intimate partner violence:    Fear of current or ex partner: Not on file    Emotionally abused: Not on file    Physically abused: Not on file    Forced sexual activity: Not on file  Other Topics Concern  . Not on file   Social History Narrative   Has 6 brothers, 6 sisters - 2 Brothers with with DM & HTN        Outpatient Medications Prior to Visit  Medication Sig Dispense Refill  . aspirin 81 MG chewable tablet Chew 1 tablet (81 mg total) by mouth daily.    . metFORMIN (GLUCOPHAGE) 1000 MG tablet TAKE 1 TABLET BY MOUTH TWICE DAILY WITH A MEAL 180 tablet 0  . metoprolol tartrate (LOPRESSOR) 50 MG tablet Take 1 tablet (50 mg total) by mouth 2 (two) times daily. 60 tablet 1  . rosuvastatin (CRESTOR) 40 MG tablet Take 1 tablet (40 mg total) by mouth daily. 30 tablet 0  . ticagrelor (BRILINTA) 90 MG TABS tablet Take 1 tablet (90 mg total) by mouth 2 (two) times daily. 60 tablet 1   No facility-administered medications prior to visit.     No Known Allergies  ROS Review of Systems Review of Systems  Constitutional: Negative for activity change, appetite change, chills and fever.  HENT: Negative for congestion, nosebleeds, trouble swallowing and voice change.   Respiratory: Negative for cough, shortness of breath and wheezing.   Gastrointestinal: Negative for diarrhea, nausea and vomiting.  Genitourinary: Negative for difficulty urinating, dysuria, flank pain and hematuria.  Musculoskeletal: Negative for back pain, joint swelling and neck pain.  Neurological: Negative for dizziness, speech difficulty, light-headedness and numbness.  See HPI. All other review of systems negative.     Objective:    Physical Exam  BP 118/80   Pulse 78   Temp 98.1 F (36.7 C) (Oral)   Resp 18   Ht 5' 9"  (1.753 m)   Wt 172 lb (78 kg)   SpO2 96%   BMI 25.40 kg/m  Wt Readings from Last 3 Encounters:  04/08/19 172 lb (78 kg)  04/02/19 172 lb 6.4 oz (78.2 kg)  03/05/19 191 lb 9.3 oz (86.9 kg)   Physical Exam  Constitutional: Oriented to person, place, and time. Appears well-developed and well-nourished.  HENT:  Head: Normocephalic and atraumatic.  Eyes: Conjunctivae and EOM are normal.  Cardiovascular: Normal  rate, regular rhythm, normal heart sounds and intact distal pulses.  No murmur heard. Pulmonary/Chest: Effort normal and breath sounds normal. No stridor. No respiratory distress. Has no wheezes.  Neurological: Is alert and oriented to person, place, and time.  Skin: Skin is warm. Capillary refill takes less than 2 seconds.  Psychiatric: Has a normal mood and affect. Behavior is normal.  Judgment and thought content normal.    Health Maintenance Due  Topic Date Due  . URINE MICROALBUMIN  06/25/2018  . FOOT EXAM  09/23/2018  . COLONOSCOPY  01/12/2019  . OPHTHALMOLOGY EXAM  04/04/2019    There are no preventive care reminders to display for this patient.  Lab Results  Component Value Date   TSH 1.62 09/17/2016   Lab Results  Component Value Date   WBC 6.2 04/02/2019   HGB 13.4 04/02/2019   HCT 40.4 04/02/2019   MCV 89.4 04/02/2019   PLT 358 04/02/2019   Lab Results  Component Value Date   NA 136 04/02/2019   K 3.7 04/02/2019   CO2 22 04/02/2019   GLUCOSE 139 (H) 04/02/2019   BUN 7 04/02/2019   CREATININE 0.69 04/02/2019   BILITOT 0.4 03/05/2019   ALKPHOS 63 03/05/2019   AST 35 03/05/2019   ALT 26 03/05/2019   PROT 7.6 03/05/2019   ALBUMIN 3.5 03/05/2019   CALCIUM 9.0 04/02/2019   ANIONGAP 12 04/02/2019   Lab Results  Component Value Date   CHOL 272 (H) 04/01/2019   Lab Results  Component Value Date   HDL 54 04/01/2019   Lab Results  Component Value Date   LDLCALC 158 (H) 04/01/2019   Lab Results  Component Value Date   TRIG 301 (H) 04/01/2019   Lab Results  Component Value Date   CHOLHDL 5.0 04/01/2019   Lab Results  Component Value Date   HGBA1C 7.6 (H) 04/01/2019      Assessment & Plan:   Problem List Items Addressed This Visit      Cardiovascular and Mediastinum   NSTEMI (non-ST elevated myocardial infarction) ([place]) - Primary   Relevant Orders   CMP14+EGFR   CBC    Other Visit Diagnoses    Type 2 diabetes mellitus with cardiac  complication ([place])       Relevant Orders   CMP14+EGFR   CBC   History of 2019 novel coronavirus disease (COVID-19)         Completed work note as patient cannot return to work until [place] and clearance from Cardiology  Patient diabetes has improved however this is due to poor po intake Would advise that he continue metformin but improve his protein intake  NSTEMI on 04/01/2019 with left ventricular dysfunction  Advised patient to follow up with Cardiology Patient to follow up with Cardiology for clearance to return to work but will need several weeks of Cardiac rehabilitation  A total of 35 minutes were spent face-to-face with the patient during this encounter and over half of that time was spent on counseling and coordination of care.    Meds ordered this encounter  Medications  . zolpidem (AMBIEN) 5 MG tablet    Sig: Take 1 tablet (5 mg total) by mouth at bedtime as needed for sleep.    Dispense:  15 tablet    Refill:  1    Follow-up: Return in about 4 weeks (around 05/06/2019) for nstemi.    Forrest Moron, MD

## 2019-04-08 NOTE — Patient Instructions (Signed)
° ° ° °  If you have lab work done today you will be contacted with your lab results within the next 2 weeks.  If you have not heard from us then please contact us. The fastest way to get your results is to register for My Chart. ° ° °IF you received an x-ray today, you will receive an invoice from [facility]. Please contact [facility] at [phone] with questions or concerns regarding your invoice.  ° °IF you received labwork today, you will receive an invoice from [facility]. Please contact [facility] at [phone] with questions or concerns regarding your invoice.  ° °Our billing staff will not be able to assist you with questions regarding bills from these companies. ° °You will be contacted with the lab results as soon as they are available. The fastest way to get your results is to activate your My Chart account. Instructions are located on the last page of this paperwork. If you have not heard from us regarding the results in 2 weeks, please contact this office. °  ° ° ° °

## 2019-04-09 LAB — CBC
Hematocrit: 40.1 % (ref 37.5–51.0)
Hemoglobin: 13.4 g/dL (ref 13.0–17.7)
MCH: 30.2 pg (ref 26.6–33.0)
MCHC: 33.4 g/dL (ref 31.5–35.7)
MCV: 90 fL (ref 79–97)
Platelets: 388 10*3/uL (ref 150–450)
RBC: 4.44 x10E6/uL (ref 4.14–5.80)
RDW: 13.5 % (ref 11.6–15.4)
WBC: 6.4 10*3/uL (ref 3.4–10.8)

## 2019-04-09 LAB — CMP14+EGFR
ALT: 15 IU/L (ref 0–44)
AST: 17 IU/L (ref 0–40)
Albumin/Globulin Ratio: 1.5 (ref 1.2–2.2)
Albumin: 4.1 g/dL (ref 3.8–4.9)
Alkaline Phosphatase: 48 IU/L (ref 39–117)
BUN/Creatinine Ratio: 13 (ref 9–20)
BUN: 11 mg/dL (ref 6–24)
Bilirubin Total: <0.2 mg/dL (ref 0.0–1.2)
CO2: 16 mmol/L — ABNORMAL LOW (ref 20–29)
Calcium: 9.3 mg/dL (ref 8.7–10.2)
Chloride: 102 mmol/L (ref 96–106)
Creatinine, Ser: 0.88 mg/dL (ref 0.76–1.27)
GFR calc Af Amer: 109 mL/min/{1.73_m2} (ref >59)
GFR calc non Af Amer: 95 mL/min/{1.73_m2} (ref >59)
Globulin, Total: 2.8 g/dL (ref 1.5–4.5)
Glucose: 173 mg/dL — ABNORMAL HIGH (ref 65–99)
Potassium: 3.7 mmol/L (ref 3.5–5.2)
Sodium: 137 mmol/L (ref 134–144)
Total Protein: 6.9 g/dL (ref 6.0–8.5)

## 2019-04-09 NOTE — Telephone Encounter (Signed)
PT returned this office's call please give him a call back.

## 2019-04-10 NOTE — Telephone Encounter (Signed)
smartphone/ consent/ my chart/ pre reg completed °

## 2019-04-13 NOTE — Progress Notes (Addendum)
Virtual Visit via Telephone Note   This visit type was conducted due to national recommendations for restrictions regarding the COVID-19 Pandemic (e.g. social distancing) in an effort to limit this patient's exposure and mitigate transmission in our community.  Due to his co-morbid illnesses, this patient is at least at moderate risk for complications without adequate follow up.  This format is felt to be most appropriate for this patient at this time.  The patient did not have access to video technology/had technical difficulties with video requiring transitioning to audio format only (telephone).  All issues noted in this document were discussed and addressed.  No physical exam could be performed with this format.  Please refer to the patient's chart for his  consent to telehealth for [facility] ([place]).   Date:  04/13/2019   ID:  Geradine Girt, DOB 1961/10/15, MRN [id number]  Patient Location: Home Provider Location: Home  PCP:  Forrest Moron, MD  Cardiologist:  Glenetta Hew, MD  Electrophysiologist:  None   Evaluation Performed:  Follow-Up Visit  Chief Complaint:  Post hospitalization follow up  History of Present Illness:    Wesley Davila is a 58 y.o. male we are seeing post hospitalization follow up after admission for chest pain on 04/01/2019. In ED he was found to have positive troponin and diagnosed with NSTEMI. He had subsequent cardiac cath which revealed 90% stenosis of the proximal to mid circumflex.   He had PCI with DES reducing the stenosis to 0%. Normal LVEF of 55%-60%. He was discharged on DAPT with Brilinta and ASA. Crestor was increased to 40 mg, with LDL of 158 on 04/01/2019 and total cholesterol of 272. TG 301.   He denies any recurrent chest pain today, no DOE, nausea or medication intolerance. No bleeding issues on Brilinta. He is compliant with his medications.   The patient does not have symptoms concerning for COVID-19 infection (fever, chills, cough, or new  shortness of breath).    Past Medical History:  Diagnosis Date  . Diabetes mellitus without complication ([place])   . Hyperlipidemia    Past Surgical History:  Procedure Laterality Date  . CORONARY STENT INTERVENTION N/A 04/01/2019   Procedure: CORONARY STENT INTERVENTION;  Surgeon: Martinique, Peter M, MD;  Location: [place] [facility];  Service: Cardiovascular;  Laterality: N/A;  . LEFT HEART CATH AND CORONARY ANGIOGRAPHY N/A 04/01/2019   Procedure: LEFT HEART CATH AND CORONARY ANGIOGRAPHY;  Surgeon: Martinique, Peter M, MD;  Location: [place] [facility];  Service: Cardiovascular;  Laterality: N/A;  . NM MYOVIEW LTD  09/2016   Fixed small, mild basal-mid inferior perfusion defect likely diaphragmatic attenuation. No evidence of ischemia. EF 44% with diffuse hypokinesis per computer read, but visually it would appear to be better. Considered intermediate risk.     No outpatient medications have been marked as taking for the 04/14/19 encounter (Appointment) with Lendon Colonel, NP.     Allergies:   Patient has no known allergies.   Social History   Tobacco Use  . Smoking status: Never Smoker  . Smokeless tobacco: Current User    Types: Snuff  . Tobacco comment: since 1980   Substance Use Topics  . Alcohol use: No  . Drug use: No     Family Hx: The patient's family history includes Diabetes in his brother and brother; Heart disease (age of onset: 83) in his father; Hypertension in his brother and brother; Stroke in his father. There is no history of Colon cancer.  ROS:  Please see the history of present illness.    All other systems reviewed and are negative.   Prior CV studies:   The following studies were reviewed today: Cardiac Cath 04/01/2019  Dist LAD lesion is 60% stenosed.  Prox Cx to Mid Cx lesion is 90% stenosed.  Post intervention, there is a 0% residual stenosis.  A drug-eluting stent was successfully placed using a STENT RESOLUTE ONYX 3.5X12.  The left  ventricular systolic function is normal.  LV end diastolic pressure is normal.  The left ventricular ejection fraction is 55-65% by visual estimate.   1. Single vessel obstructive CAD 2. Normal LV function 3. Normal LV filling pressure 4. Successful PCI of the mid LCx with DES x 1  Echocardiogram 04/01/2019  1. The left ventricle has normal systolic function, with an ejection fraction of 55-60%. The cavity size was normal. Left ventricular diastolic Doppler parameters are consistent with impaired relaxation. No evidence of left ventricular regional wall  motion abnormalities.  2. The right ventricle has normal systolic function. The cavity was normal. There is no increase in right ventricular wall thickness. Right ventricular systolic pressure could not be assessed.  3. The aortic valve is tricuspid. Mild sclerosis of the aortic valve. Aortic valve regurgitation is mild to moderate by color flow Doppler.  Labs/Other Tests and Data Reviewed:    EKG:  No ECG reviewed.  Recent Labs: 04/08/2019: ALT 15; BUN 11; Creatinine, Ser 0.88; Hemoglobin 13.4; Platelets 388; Potassium 3.7; Sodium 137   Recent Lipid Panel Lab Results  Component Value Date/Time   CHOL 272 (H) 04/01/2019 03:18 AM   CHOL 279 (H) 09/26/2018 04:41 PM   TRIG 301 (H) 04/01/2019 03:18 AM   HDL 54 04/01/2019 03:18 AM   HDL 50 09/26/2018 04:41 PM   CHOLHDL 5.0 04/01/2019 03:18 AM   LDLCALC 158 (H) 04/01/2019 03:18 AM   LDLCALC 185 (H) 09/26/2018 04:41 PM    Wt Readings from Last 3 Encounters:  04/08/19 172 lb (78 kg)  04/02/19 172 lb 6.4 oz (78.2 kg)  03/05/19 191 lb 9.3 oz (86.9 kg)     Objective:    Vital Signs:  There were no vitals taken for this visit. Limited exam due to telephone encounter.  VITAL SIGNS:  reviewed GEN:  no acute distress NEURO:  alert and oriented x 3, no obvious focal deficit PSYCH:  normal affect  ASSESSMENT & PLAN:    1. CAD: S/P NSTEMI with DES placed to the proximal Cx to mid Cx,  with 0% residual stenosis. He has 60% LAD stenosis which did not require intervention. Normal LV fx. Continue secondary management with DAPT, metoprolol, statin. Mr. Henery may return to work without restrictions.   2. Hypercholesterolemia: On high dose statin therapy, most recent LDL of 158. Will need follow up labs in 3 months for evaluation of response to medications.   3. Diabetes: Followed by PCP.Consider SGLT inhibitor for cardioprotection.     COVID-19 Education: The signs and symptoms of COVID-19 were discussed with the patient and how to seek care for testing (follow up with PCP or arrange E-visit).  The importance of social distancing was discussed today.  Time:   Today, I have spent 10 minutes with the patient with telehealth technology discussing the above problems.     Medication Adjustments/Labs and Tests Ordered: Current medicines are reviewed at length with the patient today.  Concerns regarding medicines are outlined above.   Tests Ordered: No orders of the defined types were placed  in this encounter.   Medication Changes: No orders of the defined types were placed in this encounter.   Disposition:  Follow up 3 months   Signed, Phill Myron. West Pugh, ANP, AACC  04/13/2019 2:53 PM    [facility]

## 2019-04-14 ENCOUNTER — Telehealth (INDEPENDENT_AMBULATORY_CARE_PROVIDER_SITE_OTHER): Payer: BC Managed Care – PPO | Admitting: Adult Health

## 2019-04-14 VITALS — Ht 69.0 in | Wt 175.0 lb

## 2019-04-14 DIAGNOSIS — I10 Essential (primary) hypertension: Secondary | ICD-10-CM | POA: Diagnosis not present

## 2019-04-14 DIAGNOSIS — E7800 Pure hypercholesterolemia, unspecified: Secondary | ICD-10-CM | POA: Diagnosis not present

## 2019-04-14 DIAGNOSIS — I2510 Atherosclerotic heart disease of native coronary artery without angina pectoris: Secondary | ICD-10-CM

## 2019-04-14 MED ORDER — TICAGRELOR 90 MG PO TABS: 90.0000 mg | ORAL_TABLET | Freq: Two times a day (BID) | ORAL | 3 refills | Status: DC

## 2019-04-14 MED ORDER — ROSUVASTATIN CALCIUM 40 MG PO TABS: 40.0000 mg | ORAL_TABLET | Freq: Every day | ORAL | 3 refills | Status: DC

## 2019-04-14 MED ORDER — METOPROLOL TARTRATE 50 MG PO TABS: 50.0000 mg | ORAL_TABLET | Freq: Two times a day (BID) | ORAL | 3 refills | Status: DC

## 2019-04-14 NOTE — Patient Instructions (Signed)
Follow-Up: You will need a follow up appointment in 3 months.  Please call our office 2 months in advance to schedule this appointment.  You may see Glenetta Hew, MD Jory Sims, DNP, AACC  or one of the following Advanced Practice Providers on your designated Care Team:  Rosaria Ferries, PA-C, Jory Sims, DNP, ANP       Medication Instructions:  NO CHANGES- Your physician recommends that you continue on your current medications as directed. Please refer to the Current Medication list given to you today. If you need a refill on your cardiac medications before your next appointment, please call your pharmacy. Labwork: When you have labs (blood work) and your tests are completely normal, you will receive your results ONLY by [place] (if you have MyChart) -OR- A paper copy in the mail.  At [facility], you and your health needs are our priority.  As part of our continuing mission to provide you with exceptional heart care, we have created designated Provider Care Teams.  These Care Teams include your primary Cardiologist (physician) and Advanced Practice Providers (APPs -  Physician Assistants and Nurse Practitioners) who all work together to provide you with the care you need, when you need it.  Thank you for choosing [facility] at [facility]!!

## 2019-04-17 ENCOUNTER — Telehealth (HOSPITAL_COMMUNITY): Payer: Self-pay | Admitting: *Deleted

## 2019-04-17 NOTE — Telephone Encounter (Signed)
Pt is interested in participating in Virtual Cardiac Rehab. Pt advised that Virtual Cardiac Rehab is provided at no cost to the patient.  Checklist:  1. Pt has smart device  ie smartphone and/or ipad for downloading an app  Yes 2. Reliable internet/wifi service    Yes 3. Understands how to use their smartphone and navigate within an app.  Yes   Reviewed with pt the scheduling process for virtual cardiac rehab.  Pt verbalized understanding.            Confirm Consent - In the setting of the current Covid19 crisis, you are scheduled for a phone visit with your Cardiac or Pulmonary team member.  Just as we do with many in-gym visits, in order for you to participate in this visit, we must obtain consent.  If you'd like, I can send this to your mychart (if signed up) or email for you to review.  Otherwise, I can obtain your verbal consent now.  By agreeing to a telephone visit, we'd like you to understand that the technology does not allow for your Cardiac or Pulmonary Rehab team member to perform a physical assessment, and thus may limit their ability to fully assess your ability to perform exercise programs. If your provider identifies any concerns that need to be evaluated in person, we will make arrangements to do so.  Finally, though the technology is pretty good, we cannot assure that it will always work on either your or our end and we cannot ensure that we have a secure connection.  Cardiac and Pulmonary Rehab Telehealth visits and "At Home" cardiac and pulmonary rehab are provided at no cost to you.        Are you willing to proceed?"        STAFF: Did the patient verbally acknowledge consent to telehealth visit? Document YES/NO here: Yes     Barnet Pall RN  Cardiac and Pulmonary Rehab Staff       June 19   @ 2:48 pm

## 2019-04-20 ENCOUNTER — Encounter: Payer: BLUE CROSS/BLUE SHIELD | Admitting: Gastroenterology

## 2019-04-20 ENCOUNTER — Telehealth (HOSPITAL_COMMUNITY): Payer: Self-pay | Admitting: *Deleted

## 2019-04-20 NOTE — Telephone Encounter (Signed)
Called patient to reschedule Virtual Cardiac Rehab set-up appointment due to a scheduling conflict.  LMTCB.

## 2019-04-21 ENCOUNTER — Encounter (HOSPITAL_COMMUNITY): Payer: BC Managed Care – PPO

## 2019-04-24 ENCOUNTER — Encounter (HOSPITAL_COMMUNITY)
Admission: RE | Admit: 2019-04-24 | Discharge: 2019-04-24 | Disposition: A | Discharge status: Home / Self Care | Payer: Self-pay | Source: Ambulatory Visit | Attending: Cardiology | Admitting: Cardiology

## 2019-04-24 ENCOUNTER — Telehealth (HOSPITAL_COMMUNITY): Payer: Self-pay

## 2019-04-24 NOTE — Telephone Encounter (Signed)
Called and spoke to pt regarding Virtual Cardiac Rehab.  Pt  was able to download the [employer] app on their smart device with no issues. Pt set up their account and received the following welcome message -"Welcome to the [place] and Pulmonary Rehabilitation program. We hope that you will find the exercise program beneficial in your recovery process. Our staff is available to assist with any questions/concerns about your exercise routine. Best wishes". Brief orientation provided to with the advisement to watch the "Intro to Rehab" series located under the Resource tab. Pt verbalized understanding. Will continue to follow and monitor pt progress with feedback as needed.  Carma Lair MS, ACSM CEP 2:48 PM 04/24/2019

## 2019-04-28 ENCOUNTER — Telehealth (HOSPITAL_COMMUNITY): Payer: Self-pay | Admitting: *Deleted

## 2019-04-28 HISTORY — PX: TRANSTHORACIC ECHOCARDIOGRAM: SHX275

## 2019-05-06 ENCOUNTER — Other Ambulatory Visit: Payer: Self-pay

## 2019-05-06 ENCOUNTER — Ambulatory Visit (INDEPENDENT_AMBULATORY_CARE_PROVIDER_SITE_OTHER): Payer: BC Managed Care – PPO | Admitting: Family Medicine

## 2019-05-06 ENCOUNTER — Encounter: Payer: Self-pay | Admitting: Family Medicine

## 2019-05-06 VITALS — BP 119/81 | HR 100 | Temp 98.2°F | Resp 17 | Ht 69.0 in | Wt 182.6 lb

## 2019-05-06 DIAGNOSIS — E1159 Type 2 diabetes mellitus with other circulatory complications: Secondary | ICD-10-CM

## 2019-05-06 DIAGNOSIS — I214 Non-ST elevation (NSTEMI) myocardial infarction: Secondary | ICD-10-CM

## 2019-05-06 MED ORDER — ROSUVASTATIN CALCIUM 40 MG PO TABS: 40.0000 mg | ORAL_TABLET | Freq: Every day | ORAL | 3 refills | Status: DC

## 2019-05-06 NOTE — Progress Notes (Signed)
Established Patient Office Visit  Subjective:  Patient ID: Wesley Davila, male    DOB: Dec 22, 1960  Age: 58 y.o. MRN: [id number]  CC:  Chief Complaint  Patient presents with  . Follow-up    NSTEMI  . Medication Refill    metoprolol and rosuvastatin    HPI Wesley Davila presents for   NSTEMI Pt reports that he is feeling much better but sometimes he still has a feeling "of heaviness in the center of the chest".  He denies cough, palpitations, or shortness of breath He is able to exercise by walking in his neighborhood He denies fatigue He is taking his medications as instructed  Diabetes Mellitus: Patient presents for follow up of diabetes. Symptoms: none. Symptoms have been well-controlled. Patient denies hypoglycemia , increase appetite, paresthesia of the feet and polyuria.  Evaluation to date has been included: hemoglobin A1C.  Home sugars: BGs range between 118 and 137. Treatment to date: more intensive attention to diet which has been effective. He states that he is eating much better and sticking to a diabetic diet. He is taking Metformin 500mg  bid without side effects.  Lab Results  Component Value Date   HGBA1C 7.6 (H) 04/01/2019    Wt Readings from Last 3 Encounters:  05/06/19 182 lb 9.6 oz (82.8 kg)  04/14/19 175 lb (79.4 kg)  04/08/19 172 lb (78 kg)     Past Medical History:  Diagnosis Date  . Diabetes mellitus without complication ([place])   . Hyperlipidemia     Past Surgical History:  Procedure Laterality Date  . CORONARY STENT INTERVENTION N/A 04/01/2019   Procedure: CORONARY STENT INTERVENTION;  Surgeon: Martinique, Peter M, MD;  Location: [place] [facility];  Service: Cardiovascular;  Laterality: N/A;  . LEFT HEART CATH AND CORONARY ANGIOGRAPHY N/A 04/01/2019   Procedure: LEFT HEART CATH AND CORONARY ANGIOGRAPHY;  Surgeon: Martinique, Peter M, MD;  Location: [place] [facility];  Service: Cardiovascular;  Laterality: N/A;  . NM MYOVIEW LTD  09/2016   Fixed small,  mild basal-mid inferior perfusion defect likely diaphragmatic attenuation. No evidence of ischemia. EF 44% with diffuse hypokinesis per computer read, but visually it would appear to be better. Considered intermediate risk.    Family History  Problem Relation Age of Onset  . Heart disease Father 48       MI x 2  . Stroke Father   . Hypertension Brother   . Diabetes Brother   . Hypertension Brother   . Diabetes Brother   . Colon cancer Neg Hx     Social History   Socioeconomic History  . Marital status: Married    Spouse name: Not on file  . Number of children: 3  . Years of education: Not on file  . Highest education level: Not on file  Occupational History  . Occupation: Agricultural consultant    Comment: Environmental manager  . Financial resource strain: Not on file  . Food insecurity    Worry: Not on file    Inability: Not on file  . Transportation needs    Medical: Not on file    Non-medical: Not on file  Tobacco Use  . Smoking status: Never Smoker  . Smokeless tobacco: Current User    Types: Snuff  . Tobacco comment: since 1980   Substance and Sexual Activity  . Alcohol use: No  . Drug use: No  . Sexual activity: Not on file  Lifestyle  . Physical activity    Days  per week: Not on file    Minutes per session: Not on file  . Stress: Not on file  Relationships  . Social Herbalist on phone: Not on file    Gets together: Not on file    Attends religious service: Not on file    Active member of club or organization: Not on file    Attends meetings of clubs or organizations: Not on file    Relationship status: Not on file  . Intimate partner violence    Fear of current or ex partner: Not on file    Emotionally abused: Not on file    Physically abused: Not on file    Forced sexual activity: Not on file  Other Topics Concern  . Not on file  Social History Narrative   Has 6 brothers, 6 sisters - 2 Brothers with with DM & HTN        Outpatient  Medications Prior to Visit  Medication Sig Dispense Refill  . aspirin 81 MG chewable tablet Chew 1 tablet (81 mg total) by mouth daily.    . metFORMIN (GLUCOPHAGE) 1000 MG tablet TAKE 1 TABLET BY MOUTH TWICE DAILY WITH A MEAL 180 tablet 0  . metoprolol tartrate (LOPRESSOR) 50 MG tablet Take 1 tablet (50 mg total) by mouth 2 (two) times daily. 60 tablet 3  . rosuvastatin (CRESTOR) 40 MG tablet Take 1 tablet (40 mg total) by mouth daily. 30 tablet 3  . ticagrelor (BRILINTA) 90 MG TABS tablet Take 1 tablet (90 mg total) by mouth 2 (two) times daily. 60 tablet 3  . zolpidem (AMBIEN) 5 MG tablet Take 1 tablet (5 mg total) by mouth at bedtime as needed for sleep. 15 tablet 1   No facility-administered medications prior to visit.     No Known Allergies  ROS Review of Systems Review of Systems  Constitutional: Negative for activity change, appetite change, chills and fever.  HENT: Negative for congestion, nosebleeds, trouble swallowing and voice change.   Respiratory: Negative for cough, shortness of breath and wheezing.   Gastrointestinal: Negative for diarrhea, nausea and vomiting.  Genitourinary: Negative for difficulty urinating, dysuria, flank pain and hematuria.  Musculoskeletal: Negative for back pain, joint swelling and neck pain.  Neurological: Negative for dizziness, speech difficulty, light-headedness and numbness.  See HPI. All other review of systems negative.     Objective:    Physical Exam  BP 119/81 (BP Location: Right Arm, Patient Position: Sitting, Cuff Size: Normal)   Pulse 100   Temp 98.2 F (36.8 C) (Oral)   Resp 17   Ht 5\' 9"  (1.753 m)   Wt 182 lb 9.6 oz (82.8 kg)   SpO2 97%   BMI 26.97 kg/m  Wt Readings from Last 3 Encounters:  05/06/19 182 lb 9.6 oz (82.8 kg)  04/14/19 175 lb (79.4 kg)  04/08/19 172 lb (78 kg)   Physical Exam  Constitutional: Oriented to person, place, and time. Appears well-developed and well-nourished.  HENT:  Head: Normocephalic and  atraumatic.  Eyes: Conjunctivae and EOM are normal.  Cardiovascular: Normal rate, regular rhythm, normal heart sounds and intact distal pulses.  No murmur heard. Pulmonary/Chest: Effort normal and breath sounds normal. No stridor. No respiratory distress. Has no wheezes.  Neurological: Is alert and oriented to person, place, and time.  Skin: Skin is warm. Capillary refill takes less than 2 seconds.  Psychiatric: Has a normal mood and affect. Behavior is normal. Judgment and thought content normal.  Health Maintenance Due  Topic Date Due  . URINE MICROALBUMIN  06/25/2018  . FOOT EXAM  09/23/2018  . COLONOSCOPY  01/12/2019  . OPHTHALMOLOGY EXAM  04/04/2019    There are no preventive care reminders to display for this patient.  Lab Results  Component Value Date   TSH 1.62 09/17/2016   Lab Results  Component Value Date   WBC 6.4 04/08/2019   HGB 13.4 04/08/2019   HCT 40.1 04/08/2019   MCV 90 04/08/2019   PLT 388 04/08/2019   Lab Results  Component Value Date   NA 137 04/08/2019   K 3.7 04/08/2019   CO2 16 (L) 04/08/2019   GLUCOSE 173 (H) 04/08/2019   BUN 11 04/08/2019   CREATININE 0.88 04/08/2019   BILITOT <0.2 04/08/2019   ALKPHOS 48 04/08/2019   AST 17 04/08/2019   ALT 15 04/08/2019   PROT 6.9 04/08/2019   ALBUMIN 4.1 04/08/2019   CALCIUM 9.3 04/08/2019   ANIONGAP 12 04/02/2019   Lab Results  Component Value Date   CHOL 272 (H) 04/01/2019   Lab Results  Component Value Date   HDL 54 04/01/2019   Lab Results  Component Value Date   LDLCALC 158 (H) 04/01/2019   Lab Results  Component Value Date   TRIG 301 (H) 04/01/2019   Lab Results  Component Value Date   CHOLHDL 5.0 04/01/2019   Lab Results  Component Value Date   HGBA1C 7.6 (H) 04/01/2019      Assessment & Plan:   Problem List Items Addressed This Visit      Cardiovascular and Mediastinum   NSTEMI (non-ST elevated myocardial infarction) ([place]) - Primary -  Discussed patient return to  work Await Cardiology recommendation      Other Visit Diagnoses    Type 2 diabetes mellitus with cardiac complication ([place])    - will check a1c next visit  Home monitoring is very reassuring      No orders of the defined types were placed in this encounter.   Follow-up: Return in about 6 weeks (around 06/17/2019) for diabetes, a1c check.    Forrest Moron, MD

## 2019-05-06 NOTE — Addendum Note (Signed)
Addended by: Delia Chimes A on: 05/06/2019 03:23 PM   Modules accepted: Orders

## 2019-05-06 NOTE — Patient Instructions (Addendum)
  Will discuss cardiac clearance with Cardiology who is the primary for the NSTEMI (heart attack). Once I hear from them and get their guidance I will call you to discuss return to work.    If you have lab work done today you will be contacted with your lab results within the next 2 weeks.  If you have not heard from [place] then please contact us. The fastest way to get your results is to register for My Chart.   IF you received an x-ray today, you will receive an invoice from [facility]. Please contact [facility] at [phone] with questions or concerns regarding your invoice.   IF you received labwork today, you will receive an invoice from [place]. Please contact [facility] at [phone] with questions or concerns regarding your invoice.   Our billing staff will not be able to assist you with questions regarding bills from these companies.  You will be contacted with the lab results as soon as they are available. The fastest way to get your results is to activate your My Chart account. Instructions are located on the last page of this paperwork. If you have not heard from [place] regarding the results in 2 weeks, please contact this office.

## 2019-05-08 ENCOUNTER — Telehealth: Payer: Self-pay | Admitting: Family Medicine

## 2019-05-08 NOTE — Telephone Encounter (Signed)
Pt stopped by Friday 07/10/2020to pick up  release to retiurn  to work note . Was not able to locate note at front desk  .please call call pt when ready for pick up [phone] FR

## 2019-05-09 ENCOUNTER — Encounter: Payer: Self-pay | Admitting: Family Medicine

## 2019-05-11 NOTE — Telephone Encounter (Signed)
Note was picked up today

## 2019-05-19 ENCOUNTER — Telehealth (HOSPITAL_COMMUNITY): Payer: Self-pay

## 2019-05-19 NOTE — Telephone Encounter (Signed)
Pt inactive in [employer] App.  Letter mailed requesting patient to return call regarding this by 05/26/2019. If no response, will discharge from cardiac rehab program. 

## 2019-06-17 ENCOUNTER — Telehealth: Payer: Self-pay | Admitting: Family Medicine

## 2019-06-17 ENCOUNTER — Other Ambulatory Visit: Payer: Self-pay

## 2019-06-17 ENCOUNTER — Ambulatory Visit: Payer: BC Managed Care – PPO | Admitting: Family Medicine

## 2019-06-17 NOTE — Telephone Encounter (Signed)
Pt is needing test strips for a1c testing / pt had to reschedule appt . Had to go to work

## 2019-06-18 ENCOUNTER — Other Ambulatory Visit: Payer: Self-pay

## 2019-06-18 DIAGNOSIS — E119 Type 2 diabetes mellitus without complications: Secondary | ICD-10-CM

## 2019-06-18 MED ORDER — COOL BLOOD GLUCOSE TEST STRIPS VI STRP: ORAL_STRIP | 3 refills | Status: DC

## 2019-06-23 ENCOUNTER — Other Ambulatory Visit: Payer: Self-pay | Admitting: Family Medicine

## 2019-06-23 DIAGNOSIS — E1165 Type 2 diabetes mellitus with hyperglycemia: Secondary | ICD-10-CM

## 2019-06-23 NOTE — Telephone Encounter (Signed)
Patient has appointment in 3 weeks and is current with labs

## 2019-07-14 ENCOUNTER — Ambulatory Visit: Payer: BC Managed Care – PPO | Admitting: Family Medicine

## 2019-07-15 ENCOUNTER — Encounter: Payer: Self-pay | Admitting: Family Medicine

## 2019-07-31 ENCOUNTER — Other Ambulatory Visit: Payer: Self-pay

## 2019-07-31 MED ORDER — METOPROLOL TARTRATE 50 MG PO TABS: 50.0000 mg | ORAL_TABLET | Freq: Two times a day (BID) | ORAL | 2 refills | Status: DC

## 2019-08-05 ENCOUNTER — Other Ambulatory Visit: Payer: Self-pay

## 2019-08-05 MED ORDER — METOPROLOL TARTRATE 50 MG PO TABS: 50.0000 mg | ORAL_TABLET | Freq: Two times a day (BID) | ORAL | 2 refills | Status: DC

## 2019-08-05 NOTE — Telephone Encounter (Signed)
Received fax from [facility] stating that patient wants 90 day supply for Metoprolol to be sent to them.

## 2019-08-10 ENCOUNTER — Ambulatory Visit (INDEPENDENT_AMBULATORY_CARE_PROVIDER_SITE_OTHER): Payer: BC Managed Care – PPO | Admitting: Family Medicine

## 2019-08-10 ENCOUNTER — Encounter: Payer: Self-pay | Admitting: Family Medicine

## 2019-08-10 ENCOUNTER — Other Ambulatory Visit: Payer: Self-pay

## 2019-08-10 VITALS — BP 109/72 | HR 87 | Temp 98.6°F | Resp 16 | Wt 193.0 lb

## 2019-08-10 DIAGNOSIS — Z5181 Encounter for therapeutic drug level monitoring: Secondary | ICD-10-CM

## 2019-08-10 DIAGNOSIS — Z23 Encounter for immunization: Secondary | ICD-10-CM

## 2019-08-10 DIAGNOSIS — E1165 Type 2 diabetes mellitus with hyperglycemia: Secondary | ICD-10-CM | POA: Diagnosis not present

## 2019-08-10 DIAGNOSIS — I214 Non-ST elevation (NSTEMI) myocardial infarction: Secondary | ICD-10-CM

## 2019-08-10 DIAGNOSIS — E785 Hyperlipidemia, unspecified: Secondary | ICD-10-CM

## 2019-08-10 DIAGNOSIS — G4726 Circadian rhythm sleep disorder, shift work type: Secondary | ICD-10-CM

## 2019-08-10 MED ORDER — METFORMIN HCL 1000 MG PO TABS: ORAL_TABLET | ORAL | 1 refills | Status: DC

## 2019-08-10 MED ORDER — ZOLPIDEM TARTRATE 5 MG PO TABS: 5.0000 mg | ORAL_TABLET | Freq: Every evening | ORAL | 3 refills | Status: DC | PRN

## 2019-08-10 MED ORDER — TICAGRELOR 90 MG PO TABS: 90.0000 mg | ORAL_TABLET | Freq: Two times a day (BID) | ORAL | 1 refills | Status: DC

## 2019-08-10 NOTE — Progress Notes (Signed)
Established Patient Office Visit  Subjective:  Patient ID: Wesley Davila, male    DOB: [birth year]  Age: 58 y.o. MRN: [id number]  CC:  Chief Complaint  Patient presents with  . Medication Management    3 month follow-up for chronic conditions  . Medication Refill    Brilinta    HPI Wesley Davila presents for   Diabetes Mellitus: Patient presents for follow up of diabetes. Symptoms: none. He checked his sugars and they were 170 after eating 3 days ago. Symptoms have stabilized. Patient denies foot ulcerations, hypoglycemia , increase appetite, paresthesia of the feet and polyuria.  Evaluation to date has been included: hemoglobin A1C.  Home sugars: BGs consistently in an acceptable range. Treatment to date: Continued metformin which has been effective.  Lab Results  Component Value Date   HGBA1C 7.6 (H) 04/01/2019   Shift work sleep disorder Pt has to take ambien to sleep He works nights On his off days he cannot sleep through the night and sometimes has to take tylenol PM  Dyslipidemia: Patient presents for evaluation of lipids.  Compliance with treatment thus far has been good.  A repeat fasting lipid profile was done.  The patient does not use medications that may worsen dyslipidemias (corticosteroids, progestins, anabolic steroids, diuretics, beta-blockers, amiodarone, cyclosporine, olanzapine). The patient exercises weekly.  The patient is known to have coexisting coronary artery disease.          Past Medical History:  Diagnosis Date  . Diabetes mellitus without complication ([place])   . Hyperlipidemia     Past Surgical History:  Procedure Laterality Date  . CORONARY STENT INTERVENTION N/A 04/01/2019   Procedure: CORONARY STENT INTERVENTION;  Surgeon: Martinique, Peter M, MD;  Location: [facility];  Service: Cardiovascular;  Laterality: N/A;  . LEFT HEART CATH AND CORONARY ANGIOGRAPHY N/A 04/01/2019   Procedure: LEFT HEART CATH AND CORONARY ANGIOGRAPHY;  Surgeon:  Martinique, Peter M, MD;  Location: [facility];  Service: Cardiovascular;  Laterality: N/A;  . NM MYOVIEW LTD  09/2016   Fixed small, mild basal-mid inferior perfusion defect likely diaphragmatic attenuation. No evidence of ischemia. EF 44% with diffuse hypokinesis per computer read, but visually it would appear to be better. Considered intermediate risk.    Family History  Problem Relation Age of Onset  . Heart disease Father 32       MI x 2  . Stroke Father   . Hypertension Brother   . Diabetes Brother   . Hypertension Brother   . Diabetes Brother   . Colon cancer Neg Hx     Social History   Socioeconomic History  . Marital status: Married    Spouse name: Not on file  . Number of children: 3  . Years of education: Not on file  . Highest education level: Not on file  Occupational History  . Occupation: Agricultural consultant    Comment: Environmental manager  . Financial resource strain: Not on file  . Food insecurity    Worry: Not on file    Inability: Not on file  . Transportation needs    Medical: Not on file    Non-medical: Not on file  Tobacco Use  . Smoking status: Never Smoker  . Smokeless tobacco: Current User    Types: Snuff  . Tobacco comment: since 1980   Substance and Sexual Activity  . Alcohol use: No  . Drug use: No  . Sexual activity: Not on file  Lifestyle  .  Physical activity    Days per week: Not on file    Minutes per session: Not on file  . Stress: Not on file  Relationships  . Social Herbalist on phone: Not on file    Gets together: Not on file    Attends religious service: Not on file    Active member of club or organization: Not on file    Attends meetings of clubs or organizations: Not on file    Relationship status: Not on file  . Intimate partner violence    Fear of current or ex partner: Not on file    Emotionally abused: Not on file    Physically abused: Not on file    Forced sexual activity: Not on file  Other  Topics Concern  . Not on file  Social History Narrative   Has 6 brothers, 6 sisters - 2 Brothers with with DM & HTN        Outpatient Medications Prior to Visit  Medication Sig Dispense Refill  . aspirin 81 MG chewable tablet Chew 1 tablet (81 mg total) by mouth daily.    Marland Kitchen glucose blood (COOL BLOOD GLUCOSE TEST STRIPS) test strip Test 1 time daily. E11.65 100 each 3  . metoprolol tartrate (LOPRESSOR) 50 MG tablet Take 1 tablet (50 mg total) by mouth 2 (two) times daily. 180 tablet 2  . rosuvastatin (CRESTOR) 40 MG tablet Take 1 tablet (40 mg total) by mouth daily. 90 tablet 3  . metFORMIN (GLUCOPHAGE) 1000 MG tablet TAKE 1 TABLET BY MOUTH TWICE DAILY WITH A MEAL 180 tablet 0  . ticagrelor (BRILINTA) 90 MG TABS tablet Take 1 tablet (90 mg total) by mouth 2 (two) times daily. 60 tablet 3  . zolpidem (AMBIEN) 5 MG tablet Take 1 tablet (5 mg total) by mouth at bedtime as needed for sleep. 15 tablet 1   No facility-administered medications prior to visit.     No Known Allergies  ROS Review of Systems Review of Systems  Constitutional: Negative for activity change, appetite change, chills and fever.  HENT: Negative for congestion, nosebleeds, trouble swallowing and voice change.   Respiratory: Negative for cough, shortness of breath and wheezing.   Gastrointestinal: Negative for diarrhea, nausea and vomiting.  Genitourinary: Negative for difficulty urinating, dysuria, flank pain and hematuria.  Musculoskeletal: Negative for back pain, joint swelling and neck pain.  Neurological: Negative for dizziness, speech difficulty, light-headedness and numbness.  See HPI. All other review of systems negative.     Objective:    Physical Exam  BP 109/72   Pulse 87   Temp 98.6 F (37 C) (Oral)   Resp 16   Wt 193 lb (87.5 kg)   SpO2 100%   BMI 28.50 kg/m  Wt Readings from Last 3 Encounters:  08/10/19 193 lb (87.5 kg)  05/06/19 182 lb 9.6 oz (82.8 kg)  04/14/19 175 lb (79.4 kg)    Physical Exam  Constitutional: Oriented to person, place, and time. Appears well-developed and well-nourished.  HENT:  Head: Normocephalic and atraumatic.  Eyes: Conjunctivae and EOM are normal.  Cardiovascular: Normal rate, regular rhythm, normal heart sounds and intact distal pulses.  No murmur heard. Pulmonary/Chest: Effort normal and breath sounds normal. No stridor. No respiratory distress. Has no wheezes.  Neurological: Is alert and oriented to person, place, and time.  Skin: Skin is warm. Capillary refill takes less than 2 seconds.  Psychiatric: Has a normal mood and affect. Behavior is normal. Judgment  and thought content normal.    Health Maintenance Due  Topic Date Due  . URINE MICROALBUMIN  06/25/2018  . COLONOSCOPY  01/12/2019  . OPHTHALMOLOGY EXAM  04/04/2019    There are no preventive care reminders to display for this patient.  Lab Results  Component Value Date   TSH 1.62 09/17/2016   Lab Results  Component Value Date   WBC 6.4 04/08/2019   HGB 13.4 04/08/2019   HCT 40.1 04/08/2019   MCV 90 04/08/2019   PLT 388 04/08/2019   Lab Results  Component Value Date   NA 137 04/08/2019   K 3.7 04/08/2019   CO2 16 (L) 04/08/2019   GLUCOSE 173 (H) 04/08/2019   BUN 11 04/08/2019   CREATININE 0.88 04/08/2019   BILITOT <0.2 04/08/2019   ALKPHOS 48 04/08/2019   AST 17 04/08/2019   ALT 15 04/08/2019   PROT 6.9 04/08/2019   ALBUMIN 4.1 04/08/2019   CALCIUM 9.3 04/08/2019   ANIONGAP 12 04/02/2019   Lab Results  Component Value Date   CHOL 272 (H) 04/01/2019   Lab Results  Component Value Date   HDL 54 04/01/2019   Lab Results  Component Value Date   LDLCALC 158 (H) 04/01/2019   Lab Results  Component Value Date   TRIG 301 (H) 04/01/2019   Lab Results  Component Value Date   CHOLHDL 5.0 04/01/2019   Lab Results  Component Value Date   HGBA1C 7.6 (H) 04/01/2019      Assessment & Plan:   Problem List Items Addressed This Visit       Cardiovascular and Mediastinum   NSTEMI (non-ST elevated myocardial infarction) ([place]) - Primary-  Managed with meds and lifestyle modification   Relevant Orders   CMP14+EGFR   Lipid panel    Other Visit Diagnoses    Type 2 diabetes mellitus with hyperglycemia, without long-term current use of insulin ([facility])    -  Discussed goals of a1c of less than 7%   Relevant Medications   metFORMIN (GLUCOPHAGE) 1000 MG tablet   Other Relevant Orders   CMP14+EGFR   Lipid panel   Hemoglobin A1c   Dyslipidemia    - compliant with meds No side effects or concerns Continue current meds    Relevant Medications   ticagrelor (BRILINTA) 90 MG TABS tablet   Other Relevant Orders   CMP14+EGFR   Lipid panel   Encounter for medication monitoring       Relevant Orders   CMP14+EGFR   Lipid panel   Shift work sleep disorder    -  Will give increased quantity of ambien since pt has shift work sleep disorder   Relevant Medications   zolpidem (AMBIEN) 5 MG tablet   Need for immunization against influenza       Relevant Orders   Flu Vaccine QUAD 36+ mos IM      Meds ordered this encounter  Medications  . zolpidem (AMBIEN) 5 MG tablet    Sig: Take 1 tablet (5 mg total) by mouth at bedtime as needed for sleep.    Dispense:  30 tablet    Refill:  3  . metFORMIN (GLUCOPHAGE) 1000 MG tablet    Sig: Take one tablet by mouth twice daily    Dispense:  180 tablet    Refill:  1  . ticagrelor (BRILINTA) 90 MG TABS tablet    Sig: Take 1 tablet (90 mg total) by mouth 2 (two) times daily.    Dispense:  180 tablet  Refill:  1    Follow-up: No follow-ups on file.    Forrest Moron, MD

## 2019-08-10 NOTE — Patient Instructions (Addendum)
  Wt Readings from Last 3 Encounters:  08/10/19 193 lb (87.5 kg)  05/06/19 182 lb 9.6 oz (82.8 kg)  04/14/19 175 lb (79.4 kg)   Please continue to take your medications are instructed.  If you have lab work done today you will be contacted with your lab results within the next 2 weeks.  If you have not heard from [place] then please contact us. The fastest way to get your results is to register for My Chart.   IF you received an x-ray today, you will receive an invoice from [facility]. Please contact [facility] at [phone] with questions or concerns regarding your invoice.   IF you received labwork today, you will receive an invoice from [facility]. Please contact [facility] at [phone] with questions or concerns regarding your invoice.   Our billing staff will not be able to assist you with questions regarding bills from these companies.  You will be contacted with the lab results as soon as they are available. The fastest way to get your results is to activate your My Chart account. Instructions are located on the last page of this paperwork. If you have not heard from [place] regarding the results in 2 weeks, please contact this office.

## 2019-08-11 LAB — CMP14+EGFR
ALT: 22 IU/L (ref 0–44)
AST: 18 IU/L (ref 0–40)
Albumin/Globulin Ratio: 1.6 (ref 1.2–2.2)
Albumin: 4.2 g/dL (ref 3.8–4.9)
Alkaline Phosphatase: 35 IU/L — ABNORMAL LOW (ref 39–117)
BUN/Creatinine Ratio: 14 (ref 9–20)
BUN: 13 mg/dL (ref 6–24)
Bilirubin Total: 0.4 mg/dL (ref 0.0–1.2)
CO2: 21 mmol/L (ref 20–29)
Calcium: 9.3 mg/dL (ref 8.7–10.2)
Chloride: 102 mmol/L (ref 96–106)
Creatinine, Ser: 0.95 mg/dL (ref 0.76–1.27)
GFR calc Af Amer: 101 mL/min/{1.73_m2} (ref >59)
GFR calc non Af Amer: 87 mL/min/{1.73_m2} (ref >59)
Globulin, Total: 2.6 g/dL (ref 1.5–4.5)
Glucose: 206 mg/dL — ABNORMAL HIGH (ref 65–99)
Potassium: 4.1 mmol/L (ref 3.5–5.2)
Sodium: 138 mmol/L (ref 134–144)
Total Protein: 6.8 g/dL (ref 6.0–8.5)

## 2019-08-11 LAB — LIPID PANEL
Chol/HDL Ratio: 2.6 ratio (ref 0.0–5.0)
Cholesterol, Total: 145 mg/dL (ref 100–199)
HDL: 55 mg/dL (ref >39)
LDL Chol Calc (NIH): 62 mg/dL (ref 0–99)
Triglycerides: 169 mg/dL — ABNORMAL HIGH (ref 0–149)
VLDL Cholesterol Cal: 28 mg/dL (ref 5–40)

## 2019-08-11 LAB — HEMOGLOBIN A1C
Est. average glucose Bld gHb Est-mCnc: 171 mg/dL
Hgb A1c MFr Bld: 7.6 % — ABNORMAL HIGH (ref 4.8–5.6)

## 2019-09-21 ENCOUNTER — Encounter: Payer: Self-pay | Admitting: Emergency Medicine

## 2019-09-21 ENCOUNTER — Ambulatory Visit (INDEPENDENT_AMBULATORY_CARE_PROVIDER_SITE_OTHER): Payer: BC Managed Care – PPO | Admitting: Emergency Medicine

## 2019-09-21 ENCOUNTER — Other Ambulatory Visit: Payer: Self-pay

## 2019-09-21 VITALS — BP 122/79 | HR 94 | Temp 98.5°F | Resp 16 | Ht 69.0 in | Wt 193.4 lb

## 2019-09-21 DIAGNOSIS — Z0000 Encounter for general adult medical examination without abnormal findings: Secondary | ICD-10-CM

## 2019-09-21 DIAGNOSIS — Z0001 Encounter for general adult medical examination with abnormal findings: Secondary | ICD-10-CM | POA: Diagnosis not present

## 2019-09-21 DIAGNOSIS — E785 Hyperlipidemia, unspecified: Secondary | ICD-10-CM

## 2019-09-21 DIAGNOSIS — Z8679 Personal history of other diseases of the circulatory system: Secondary | ICD-10-CM

## 2019-09-21 DIAGNOSIS — Z1211 Encounter for screening for malignant neoplasm of colon: Secondary | ICD-10-CM

## 2019-09-21 DIAGNOSIS — E1169 Type 2 diabetes mellitus with other specified complication: Secondary | ICD-10-CM

## 2019-09-21 NOTE — Patient Instructions (Addendum)
   If you have lab work done today you will be contacted with your lab results within the next 2 weeks.  If you have not heard from us then please contact us. The fastest way to get your results is to register for My Chart.   IF you received an x-ray today, you will receive an invoice from [facility]. Please contact [facility] at [phone] with questions or concerns regarding your invoice.   IF you received labwork today, you will receive an invoice from [facility]. Please contact [facility] at [phone] with questions or concerns regarding your invoice.   Our billing staff will not be able to assist you with questions regarding bills from these companies.  You will be contacted with the lab results as soon as they are available. The fastest way to get your results is to activate your My Chart account. Instructions are located on the last page of this paperwork. If you have not heard from us regarding the results in 2 weeks, please contact this office.     Health Maintenance, Male Adopting a healthy lifestyle and getting preventive care are important in promoting health and wellness. Ask your health care provider about:  The right schedule for you to have regular tests and exams.  Things you can do on your own to prevent diseases and keep yourself healthy. What should I know about diet, weight, and exercise? Eat a healthy diet   Eat a diet that includes plenty of vegetables, fruits, low-fat dairy products, and lean protein.  Do not eat a lot of foods that are high in solid fats, added sugars, or sodium. Maintain a healthy weight Body mass index (BMI) is a measurement that can be used to identify possible weight problems. It estimates body fat based on height and weight. Your health care provider can help determine your BMI and help you achieve or maintain a healthy weight. Get regular exercise Get regular exercise. This is one of the most important things you  can do for your health. Most adults should:  Exercise for at least 150 minutes each week. The exercise should increase your heart rate and make you sweat (moderate-intensity exercise).  Do strengthening exercises at least twice a week. This is in addition to the moderate-intensity exercise.  Spend less time sitting. Even light physical activity can be beneficial. Watch cholesterol and blood lipids Have your blood tested for lipids and cholesterol at 59 years of age, then have this test every 5 years. You may need to have your cholesterol levels checked more often if:  Your lipid or cholesterol levels are high.  You are older than 59 years of age.  You are at high risk for heart disease. What should I know about cancer screening? Many types of cancers can be detected early and may often be prevented. Depending on your health history and family history, you may need to have cancer screening at various ages. This may include screening for:  Colorectal cancer.  Prostate cancer.  Skin cancer.  Lung cancer. What should I know about heart disease, diabetes, and high blood pressure? Blood pressure and heart disease  High blood pressure causes heart disease and increases the risk of stroke. This is more likely to develop in people who have high blood pressure readings, are of African descent, or are overweight.  Talk with your health care provider about your target blood pressure readings.  Have your blood pressure checked: ? Every 3-5 years if you are 18-39 years   of age. ? Every year if you are 40 years old or older.  If you are between the ages of 65 and 75 and are a current or former smoker, ask your health care provider if you should have a one-time screening for abdominal aortic aneurysm (AAA). Diabetes Have regular diabetes screenings. This checks your fasting blood sugar level. Have the screening done:  Once every three years after age 45 if you are at a normal weight and have  a low risk for diabetes.  More often and at a younger age if you are overweight or have a high risk for diabetes. What should I know about preventing infection? Hepatitis B If you have a higher risk for hepatitis B, you should be screened for this virus. Talk with your health care provider to find out if you are at risk for hepatitis B infection. Hepatitis C Blood testing is recommended for:  Everyone born from 1945 through 1965.  Anyone with known risk factors for hepatitis C. Sexually transmitted infections (STIs)  You should be screened each year for STIs, including gonorrhea and chlamydia, if: ? You are sexually active and are younger than 59 years of age. ? You are older than 59 years of age and your health care provider tells you that you are at risk for this type of infection. ? Your sexual activity has changed since you were last screened, and you are at increased risk for chlamydia or gonorrhea. Ask your health care provider if you are at risk.  Ask your health care provider about whether you are at high risk for HIV. Your health care provider may recommend a prescription medicine to help prevent HIV infection. If you choose to take medicine to prevent HIV, you should first get tested for HIV. You should then be tested every 3 months for as long as you are taking the medicine. Follow these instructions at home: Lifestyle  Do not use any products that contain nicotine or tobacco, such as cigarettes, e-cigarettes, and chewing tobacco. If you need help quitting, ask your health care provider.  Do not use street drugs.  Do not share needles.  Ask your health care provider for help if you need support or information about quitting drugs. Alcohol use  Do not drink alcohol if your health care provider tells you not to drink.  If you drink alcohol: ? Limit how much you have to 0-2 drinks a day. ? Be aware of how much alcohol is in your drink. In the [place] one drink equals one 12  oz bottle of beer (355 mL), one 5 oz glass of wine (148 mL), or one 1 oz glass of hard liquor (44 mL). General instructions  Schedule regular health, dental, and eye exams.  Stay current with your vaccines.  Tell your health care provider if: ? You often feel depressed. ? You have ever been abused or do not feel safe at home. Summary  Adopting a healthy lifestyle and getting preventive care are important in promoting health and wellness.  Follow your health care provider's instructions about healthy diet, exercising, and getting tested or screened for diseases.  Follow your health care provider's instructions on monitoring your cholesterol and blood pressure. This information is not intended to replace advice given to you by your health care provider. Make sure you discuss any questions you have with your health care provider. Document Released: 04/12/2008 Document Revised: 10/08/2018 Document Reviewed: 10/08/2018 Elsevier Patient Education  2020 [employer]  

## 2019-09-21 NOTE — Progress Notes (Signed)
Wesley Davila 58 y.o.   Chief Complaint  Patient presents with  . Annual Exam    HISTORY OF PRESENT ILLNESS: This is a 58 y.o. male here for annual exam. Patient has a history of diabetes and dyslipidemia as well as coronary artery disease. On multiple medications including aspirin, metformin, Lopressor, rosuvastatin, and Brilinta. Has no complaints or medical concerns today. Status post COVID-19 infection last May. HPI   Prior to Admission medications   Medication Sig Start Date End Date Taking? Authorizing Provider  aspirin 81 MG chewable tablet Chew 1 tablet (81 mg total) by mouth daily. 04/02/19  Yes Georgette Shell, MD  metFORMIN (GLUCOPHAGE) 1000 MG tablet Take one tablet by mouth twice daily 08/10/19  Yes Stallings, Zoe A, MD  metoprolol tartrate (LOPRESSOR) 50 MG tablet Take 1 tablet (50 mg total) by mouth 2 (two) times daily. 08/05/19  Yes Lendon Colonel, NP  rosuvastatin (CRESTOR) 40 MG tablet Take 1 tablet (40 mg total) by mouth daily. 05/06/19  Yes Forrest Moron, MD  ticagrelor (BRILINTA) 90 MG TABS tablet Take 1 tablet (90 mg total) by mouth 2 (two) times daily. 08/10/19  Yes Stallings, Zoe A, MD  zolpidem (AMBIEN) 5 MG tablet Take 1 tablet (5 mg total) by mouth at bedtime as needed for sleep. 08/10/19  Yes Stallings, Zoe A, MD  glucose blood (COOL BLOOD GLUCOSE TEST STRIPS) test strip Test 1 time daily. E11.65 06/18/19   Forrest Moron, MD    Not on File  Patient Active Problem List   Diagnosis Date Noted  . NSTEMI (non-ST elevated myocardial infarction) ([place]) 04/01/2019  . Left ventricular dysfunction 11/21/2016  . DOE (dyspnea on exertion) 11/21/2016  . Type 2 diabetes mellitus without complication, without long-term current use of insulin ([place]) 10/17/2016  . Hyperlipidemia LDL goal <70 10/17/2016  . Family history of cardiovascular disease 10/17/2016    Past Medical History:  Diagnosis Date  . Diabetes mellitus without complication ([place])   .  Hyperlipidemia     Past Surgical History:  Procedure Laterality Date  . CORONARY STENT INTERVENTION N/A 04/01/2019   Procedure: CORONARY STENT INTERVENTION;  Surgeon: Martinique, Peter M, MD;  Location: [facility];  Service: Cardiovascular;  Laterality: N/A;  . LEFT HEART CATH AND CORONARY ANGIOGRAPHY N/A 04/01/2019   Procedure: LEFT HEART CATH AND CORONARY ANGIOGRAPHY;  Surgeon: Martinique, Peter M, MD;  Location: [facility];  Service: Cardiovascular;  Laterality: N/A;  . NM MYOVIEW LTD  09/2016   Fixed small, mild basal-mid inferior perfusion defect likely diaphragmatic attenuation. No evidence of ischemia. EF 44% with diffuse hypokinesis per computer read, but visually it would appear to be better. Considered intermediate risk.    Social History   Socioeconomic History  . Marital status: Married    Spouse name: Not on file  . Number of children: 3  . Years of education: Not on file  . Highest education level: Not on file  Occupational History  . Occupation: Agricultural consultant    Comment: Environmental manager  . Financial resource strain: Not on file  . Food insecurity    Worry: Not on file    Inability: Not on file  . Transportation needs    Medical: Not on file    Non-medical: Not on file  Tobacco Use  . Smoking status: Never Smoker  . Smokeless tobacco: Current User    Types: Snuff  . Tobacco comment: since 1980   Substance and Sexual Activity  .  Alcohol use: No  . Drug use: No  . Sexual activity: Not on file  Lifestyle  . Physical activity    Days per week: Not on file    Minutes per session: Not on file  . Stress: Not on file  Relationships  . Social Herbalist on phone: Not on file    Gets together: Not on file    Attends religious service: Not on file    Active member of club or organization: Not on file    Attends meetings of clubs or organizations: Not on file    Relationship status: Not on file  . Intimate partner violence    Fear of  current or ex partner: Not on file    Emotionally abused: Not on file    Physically abused: Not on file    Forced sexual activity: Not on file  Other Topics Concern  . Not on file  Social History Narrative   Has 6 brothers, 6 sisters - 2 Brothers with with DM & HTN        Family History  Problem Relation Age of Onset  . Heart disease Father 35       MI x 2  . Stroke Father   . Hypertension Brother   . Diabetes Brother   . Hypertension Brother   . Diabetes Brother   . Colon cancer Neg Hx      Review of Systems  Constitutional: Negative.  Negative for chills and fever.  HENT: Negative.  Negative for congestion and sore throat.   Respiratory: Negative.  Negative for cough and shortness of breath.   Cardiovascular: Negative.  Negative for chest pain and palpitations.  Gastrointestinal: Negative.  Negative for abdominal pain, diarrhea, nausea and vomiting.  Genitourinary: Negative.  Negative for dysuria and hematuria.  Musculoskeletal: Negative.  Negative for back pain, myalgias and neck pain.  Skin: Negative.  Negative for rash.  Neurological: Negative for dizziness and headaches.  All other systems reviewed and are negative.  Vitals:   09/21/19 1528  BP: 122/79  Pulse: 94  Resp: 16  Temp: 98.5 F (36.9 C)  SpO2: 97%     Physical Exam Vitals signs reviewed.  Constitutional:      Appearance: Normal appearance.  HENT:     Head: Normocephalic.     Mouth/Throat:     Mouth: Mucous membranes are moist.     Pharynx: Oropharynx is clear.  Eyes:     Extraocular Movements: Extraocular movements intact.     Conjunctiva/sclera: Conjunctivae normal.     Pupils: Pupils are equal, round, and reactive to light.  Neck:     Musculoskeletal: Normal range of motion and neck supple.     Vascular: No carotid bruit.  Cardiovascular:     Rate and Rhythm: Normal rate and regular rhythm.     Pulses: Normal pulses.     Heart sounds: Normal heart sounds.  Pulmonary:     Effort:  Pulmonary effort is normal.     Breath sounds: Normal breath sounds.  Abdominal:     General: Bowel sounds are normal. There is no distension.     Palpations: Abdomen is soft. There is no mass.     Tenderness: There is no abdominal tenderness.  Musculoskeletal: Normal range of motion.     Right lower leg: No edema.     Left lower leg: No edema.  Lymphadenopathy:     Cervical: No cervical adenopathy.  Skin:    General:  Skin is warm and dry.     Capillary Refill: Capillary refill takes less than 2 seconds.  Neurological:     General: No focal deficit present.     Mental Status: He is alert and oriented to person, place, and time.     Sensory: No sensory deficit.     Motor: No weakness.     Coordination: Coordination normal.     Gait: Gait normal.  Psychiatric:        Mood and Affect: Mood normal.        Behavior: Behavior normal.      ASSESSMENT & PLAN: Javione was seen today for annual exam.  Diagnoses and all orders for this visit:  Routine general medical examination at a health care facility  Dyslipidemia associated with type 2 diabetes mellitus ([place]) -     Ambulatory referral to Ophthalmology  Colon cancer screening -     Ambulatory referral to Gastroenterology  History of coronary artery disease    Patient Instructions       If you have lab work done today you will be contacted with your lab results within the next 2 weeks.  If you have not heard from [place] then please contact us. The fastest way to get your results is to register for My Chart.   IF you received an x-ray today, you will receive an invoice from [facility]. Please contact [facility] at [phone] with questions or concerns regarding your invoice.   IF you received labwork today, you will receive an invoice from [place]. Please contact [facility] at [phone] with questions or concerns regarding your invoice.   Our billing staff will not be able to assist you with  questions regarding bills from these companies.  You will be contacted with the lab results as soon as they are available. The fastest way to get your results is to activate your My Chart account. Instructions are located on the last page of this paperwork. If you have not heard from [place] regarding the results in 2 weeks, please contact this office.      Health Maintenance, Male Adopting a healthy lifestyle and getting preventive care are important in promoting health and wellness. Ask your health care provider about:  The right schedule for you to have regular tests and exams.  Things you can do on your own to prevent diseases and keep yourself healthy. What should I know about diet, weight, and exercise? Eat a healthy diet   Eat a diet that includes plenty of vegetables, fruits, low-fat dairy products, and lean protein.  Do not eat a lot of foods that are high in solid fats, added sugars, or sodium. Maintain a healthy weight Body mass index (BMI) is a measurement that can be used to identify possible weight problems. It estimates body fat based on height and weight. Your health care provider can help determine your BMI and help you achieve or maintain a healthy weight. Get regular exercise Get regular exercise. This is one of the most important things you can do for your health. Most adults should:  Exercise for at least 150 minutes each week. The exercise should increase your heart rate and make you sweat (moderate-intensity exercise).  Do strengthening exercises at least twice a week. This is in addition to the moderate-intensity exercise.  Spend less time sitting. Even light physical activity can be beneficial. Watch cholesterol and blood lipids Have your blood tested for lipids and cholesterol at 59 years of age, then have  this test every 5 years. You may need to have your cholesterol levels checked more often if:  Your lipid or cholesterol levels are high.  You are older than  59 years of age.  You are at high risk for heart disease. What should I know about cancer screening? Many types of cancers can be detected early and may often be prevented. Depending on your health history and family history, you may need to have cancer screening at various ages. This may include screening for:  Colorectal cancer.  Prostate cancer.  Skin cancer.  Lung cancer. What should I know about heart disease, diabetes, and high blood pressure? Blood pressure and heart disease  High blood pressure causes heart disease and increases the risk of stroke. This is more likely to develop in people who have high blood pressure readings, are of African descent, or are overweight.  Talk with your health care provider about your target blood pressure readings.  Have your blood pressure checked: ? Every 3-5 years if you are 58-25 years of age. ? Every year if you are 46 years old or older.  If you are between the ages of 71 and 49 and are a current or former smoker, ask your health care provider if you should have a one-time screening for abdominal aortic aneurysm (AAA). Diabetes Have regular diabetes screenings. This checks your fasting blood sugar level. Have the screening done:  Once every three years after age 1 if you are at a normal weight and have a low risk for diabetes.  More often and at a younger age if you are overweight or have a high risk for diabetes. What should I know about preventing infection? Hepatitis B If you have a higher risk for hepatitis B, you should be screened for this virus. Talk with your health care provider to find out if you are at risk for hepatitis B infection. Hepatitis C Blood testing is recommended for:  Everyone born from [birth year] through 1966.  Anyone with known risk factors for hepatitis C. Sexually transmitted infections (STIs)  You should be screened each year for STIs, including gonorrhea and chlamydia, if: ? You are sexually active and  are younger than 59 years of age. ? You are older than 59 years of age and your health care provider tells you that you are at risk for this type of infection. ? Your sexual activity has changed since you were last screened, and you are at increased risk for chlamydia or gonorrhea. Ask your health care provider if you are at risk.  Ask your health care provider about whether you are at high risk for HIV. Your health care provider may recommend a prescription medicine to help prevent HIV infection. If you choose to take medicine to prevent HIV, you should first get tested for HIV. You should then be tested every 3 months for as long as you are taking the medicine. Follow these instructions at home: Lifestyle  Do not use any products that contain nicotine or tobacco, such as cigarettes, e-cigarettes, and chewing tobacco. If you need help quitting, ask your health care provider.  Do not use street drugs.  Do not share needles.  Ask your health care provider for help if you need support or information about quitting drugs. Alcohol use  Do not drink alcohol if your health care provider tells you not to drink.  If you drink alcohol: ? Limit how much you have to 0-2 drinks a day. ? Be aware of how  much alcohol is in your drink. In the [place] one drink equals one 12 oz bottle of beer (355 mL), one 5 oz glass of wine (148 mL), or one 1 oz glass of hard liquor (44 mL). General instructions  Schedule regular health, dental, and eye exams.  Stay current with your vaccines.  Tell your health care provider if: ? You often feel depressed. ? You have ever been abused or do not feel safe at home. Summary  Adopting a healthy lifestyle and getting preventive care are important in promoting health and wellness.  Follow your health care provider's instructions about healthy diet, exercising, and getting tested or screened for diseases.  Follow your health care provider's instructions on monitoring  your cholesterol and blood pressure. This information is not intended to replace advice given to you by your health care provider. Make sure you discuss any questions you have with your health care provider. Document Released: 04/12/2008 Document Revised: 10/08/2018 Document Reviewed: 10/08/2018 Elsevier Patient Education  2020 Elsevier Inc.      Agustina Caroli, MD [facility]

## 2019-10-01 ENCOUNTER — Other Ambulatory Visit: Payer: Self-pay

## 2019-10-01 DIAGNOSIS — Z20822 Contact with and (suspected) exposure to covid-19: Secondary | ICD-10-CM

## 2019-10-02 DIAGNOSIS — Z20828 Contact with and (suspected) exposure to other viral communicable diseases: Secondary | ICD-10-CM | POA: Diagnosis not present

## 2019-10-03 LAB — NOVEL CORONAVIRUS, NAA: SARS-CoV-2, NAA: NOT DETECTED

## 2019-10-12 ENCOUNTER — Other Ambulatory Visit: Payer: Self-pay | Admitting: Family Medicine

## 2019-10-12 DIAGNOSIS — E119 Type 2 diabetes mellitus without complications: Secondary | ICD-10-CM

## 2019-11-06 ENCOUNTER — Encounter: Payer: Self-pay | Admitting: Emergency Medicine

## 2019-12-08 ENCOUNTER — Other Ambulatory Visit: Payer: Self-pay | Admitting: Adult Health

## 2019-12-08 NOTE — Telephone Encounter (Signed)
Rx has been sent to the pharmacy electronically. ° °

## 2020-02-11 ENCOUNTER — Other Ambulatory Visit: Payer: Self-pay | Admitting: Family Medicine

## 2020-02-11 DIAGNOSIS — G4726 Circadian rhythm sleep disorder, shift work type: Secondary | ICD-10-CM

## 2020-02-11 NOTE — Telephone Encounter (Signed)
Requested medication (s) are due for refill today: yes  Requested medication (s) are on the active medication list: yes  Last refill:  08/10/19 #30 with 3 refills  Future visit scheduled: no  Notes to clinic:  Refill not delegated per protocol    Requested Prescriptions  Pending Prescriptions Disp Refills   zolpidem (AMBIEN) 5 MG tablet [Pharmacy Med Name: ZOLPIDEM 5MG  TABLETS] 30 tablet     Sig: TAKE 1 TABLET(5 MG) BY MOUTH AT BEDTIME AS NEEDED FOR SLEEP      Not Delegated - Psychiatry:  Anxiolytics/Hypnotics Failed - 02/11/2020  3:38 PM      Failed - This refill cannot be delegated      Failed - Urine Drug Screen completed in last 360 days.      Passed - Valid encounter within last 6 months    Recent Outpatient Visits           4 months ago Routine general medical examination at a health care facility   Primary Care at [facility], [place], MD   6 months ago NSTEMI (non-ST elevated myocardial infarction) [facility])   Primary Care at [facility], [place], MD   9 months ago NSTEMI (non-ST elevated myocardial infarction) [facility])   Primary Care at [place], MD   10 months ago NSTEMI (non-ST elevated myocardial infarction) [facility])   Primary Care at [facility], Arlie Solomons, MD   11 months ago Bronchitis due to COVID-19 virus   Primary Care at [facility], Arlie Solomons, MD

## 2020-03-01 ENCOUNTER — Other Ambulatory Visit: Payer: Self-pay | Admitting: Family Medicine

## 2020-03-01 DIAGNOSIS — E1165 Type 2 diabetes mellitus with hyperglycemia: Secondary | ICD-10-CM

## 2020-03-01 DIAGNOSIS — E785 Hyperlipidemia, unspecified: Secondary | ICD-10-CM

## 2020-03-01 NOTE — Telephone Encounter (Signed)
Requested medications are due for refill today?  Yes  - This refill cannot be delegated.    Requested medications are on active medication list?  Yes  Last Refill:   08/10/2019  # 180 with 1 refill   Future visit scheduled?  No   Notes to Clinic:  This refill cannot be delegated.

## 2020-03-01 NOTE — Telephone Encounter (Signed)
Requested Prescriptions  Pending Prescriptions Disp Refills  . BRILINTA 90 MG TABS tablet [Pharmacy Med Name: BRILINTA 90MG TABLETS] 180 tablet 1    Sig: TAKE 1 TABLET(90 MG) BY MOUTH TWICE DAILY     Not Delegated - Hematology: Antiplatelets - ticagrelor Failed - 03/01/2020  3:36 AM      Failed - This refill cannot be delegated      Failed - Cr in normal range and within 180 days    Creat  Date Value Ref Range Status  09/17/2016 0.95 0.70 - 1.33 mg/dL Final    Comment:      For patients > or = 60 years of age: The upper reference limit for Creatinine is approximately 13% higher for people identified as African-American.      Creatinine, Ser  Date Value Ref Range Status  08/10/2019 0.95 0.76 - 1.27 mg/dL Final         Failed - HCT in normal range and within 180 days    Hematocrit  Date Value Ref Range Status  04/08/2019 40.1 37.5 - 51.0 % Final         Failed - HGB in normal range and within 180 days    Hemoglobin  Date Value Ref Range Status  04/08/2019 13.4 13.0 - 17.7 g/dL Final         Failed - PLT in normal range and within 180 days    Platelets  Date Value Ref Range Status  04/08/2019 388 150 - 450 x10E3/uL Final         Passed - Valid encounter within last 6 months    Recent Outpatient Visits          5 months ago Routine general medical examination at a health care facility   Primary Care at [facility], [place], MD   6 months ago NSTEMI (non-ST elevated myocardial infarction) [facility])   Primary Care at [facility], [place], MD   10 months ago NSTEMI (non-ST elevated myocardial infarction) [facility])   Primary Care at [facility], [place], MD   10 months ago NSTEMI (non-ST elevated myocardial infarction) [facility])   Primary Care at [place], MD   11 months ago Bronchitis due to COVID-19 virus   Primary Care at [facility], [place] A, MD             . metFORMIN (GLUCOPHAGE) 1000 MG tablet [Pharmacy Med Name: METFORMIN 1000MG  TABLETS] 60 tablet 0    Sig: TAKE 1 TABLET BY MOUTH TWICE DAILY     Endocrinology:  Diabetes - Biguanides Failed - 03/01/2020  3:36 AM      Failed - HBA1C is between 0 and 7.9 and within 180 days    Hgb A1c MFr Bld  Date Value Ref Range Status  08/10/2019 7.6 (H) 4.8 - 5.6 % Final    Comment:             Prediabetes: 5.7 - 6.4          Diabetes: >6.4          Glycemic control for adults with diabetes: <7.0          Passed - Cr in normal range and within 360 days    Creat  Date Value Ref Range Status  09/17/2016 0.95 0.70 - 1.33 mg/dL Final    Comment:      For patients > or = 60 years of age: The upper reference limit for Creatinine is approximately 13% higher  for people identified as African-American.      Creatinine, Ser  Date Value Ref Range Status  08/10/2019 0.95 0.76 - 1.27 mg/dL Final         Passed - eGFR in normal range and within 360 days    GFR calc Af Amer  Date Value Ref Range Status  08/10/2019 101 >59 mL/min/1.73 Final   GFR calc non Af Amer  Date Value Ref Range Status  08/10/2019 87 >59 mL/min/1.73 Final         Passed - Valid encounter within last 6 months    Recent Outpatient Visits          5 months ago Routine general medical examination at a health care facility   Primary Care at [facility], [place], MD   6 months ago NSTEMI (non-ST elevated myocardial infarction) [facility])   Primary Care at [facility], [place], MD   10 months ago NSTEMI (non-ST elevated myocardial infarction) [facility])   Primary Care at [facility], [place], MD   10 months ago NSTEMI (non-ST elevated myocardial infarction) [facility])   Primary Care at [place], MD   11 months ago Bronchitis due to COVID-19 virus   Primary Care at [facility], Arlie Solomons, MD

## 2020-04-08 ENCOUNTER — Other Ambulatory Visit: Payer: Self-pay

## 2020-04-08 DIAGNOSIS — E1165 Type 2 diabetes mellitus with hyperglycemia: Secondary | ICD-10-CM

## 2020-04-08 MED ORDER — METFORMIN HCL 1000 MG PO TABS: ORAL_TABLET | ORAL | 0 refills | Status: DC

## 2020-05-11 IMAGING — DX PORTABLE CHEST - 1 VIEW
1 series · 1 of 1 positions shown · non-contrast
Comparison: 02/18/2016

CLINICAL DATA: Cough

EXAM:
PORTABLE CHEST 1 VIEW

[chest ap]
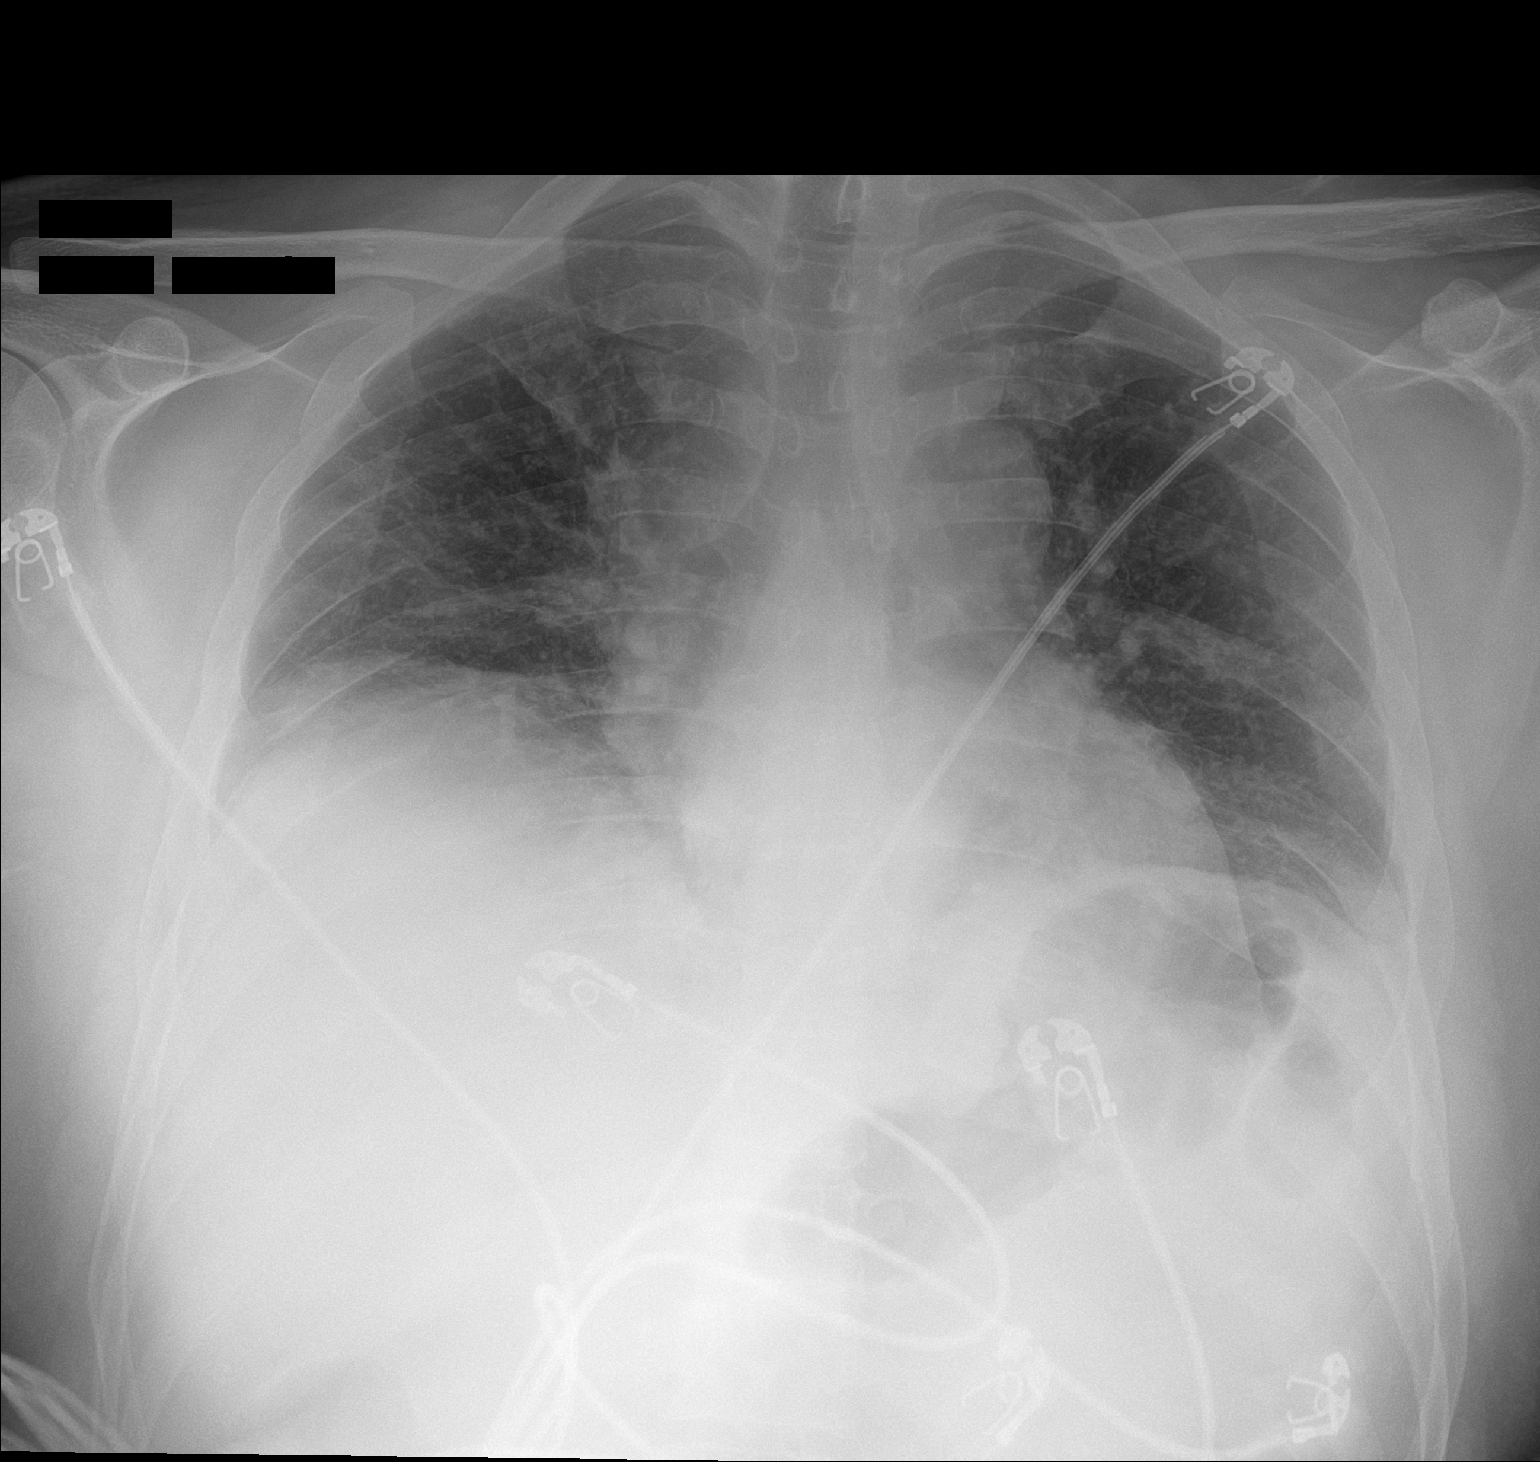

[1 of 1 positions shown; findings below may reference images not displayed]

FINDINGS: The cardiac silhouette is mildly enlarged. The lung volumes are low.
There are prominent interstitial lung markings bilaterally. There is
no pneumothorax. There is some blunting of the costophrenic angles
bilaterally.
IMPRESSION: 1. Low lung volumes. Prominent interstitial lung markings
bilaterally which can be seen in patients with pulmonary edema
versus an atypical infectious process.
2. Bibasilar airspace opacities concerning for atelectasis or
developing infiltrates.
3. Cardiomegaly.  Probable trace bilateral pleural effusions.

## 2020-05-15 ENCOUNTER — Other Ambulatory Visit: Payer: Self-pay | Admitting: Family Medicine

## 2020-05-15 DIAGNOSIS — E1165 Type 2 diabetes mellitus with hyperglycemia: Secondary | ICD-10-CM

## 2020-05-15 NOTE — Telephone Encounter (Signed)
Requested medication (s) are due for refill today: yes  Requested medication (s) are on the active medication list: yes  Last refill:  04/08/20 courtesy refill  Future visit scheduled: no  Notes to clinic:  [facility] no longer making appts for this practice   Requested Prescriptions  Pending Prescriptions Disp Refills   metFORMIN (GLUCOPHAGE) 1000 MG tablet [Pharmacy Med Name: METFORMIN 1000MG TABLETS] 60 tablet 0    Sig: TAKE 1 TABLET BY MOUTH TWICE DAILY      Endocrinology:  Diabetes - Biguanides Failed - 05/15/2020 11:06 AM      Failed - HBA1C is between 0 and 7.9 and within 180 days    Hgb A1c MFr Bld  Date Value Ref Range Status  08/10/2019 7.6 (H) 4.8 - 5.6 % Final    Comment:             Prediabetes: 5.7 - 6.4          Diabetes: >6.4          Glycemic control for adults with diabetes: <7.0           Failed - Valid encounter within last 6 months    Recent Outpatient Visits           7 months ago Routine general medical examination at a health care facility   Primary Care at [place] [facility], [place], MD   9 months ago NSTEMI (non-ST elevated myocardial infarction) ([facility])   Primary Care at [place] Stallings, Zoe A, MD   1 year ago NSTEMI (non-ST elevated myocardial infarction) ([facility])   Primary Care at [place] Stallings, Zoe A, MD   1 year ago NSTEMI (non-ST elevated myocardial infarction) ([facility])   Primary Care at [place] Stallings, Zoe A, MD   1 year ago Bronchitis due to COVID-19 virus   Primary Care at [place] Stallings, Zoe A, MD              Passed - Cr in normal range and within 360 days    Creat  Date Value Ref Range Status  09/17/2016 0.95 0.70 - 1.33 mg/dL Final    Comment:      For patients > or = 60 years of age: The upper reference limit for Creatinine is approximately 13% higher for people identified as African-American.      Creatinine, Ser  Date Value Ref Range Status  08/10/2019 0.95 0.76 - 1.27 mg/dL Final          Passed - eGFR in normal  range and within 360 days    GFR calc Af Amer  Date Value Ref Range Status  08/10/2019 101 >59 mL/min/1.73 Final   GFR calc non Af Amer  Date Value Ref Range Status  08/10/2019 87 >59 mL/min/1.73 Final               

## 2020-05-16 NOTE — Telephone Encounter (Signed)
Pt called [id number] Please Advice

## 2020-05-16 NOTE — Telephone Encounter (Signed)
Please schedule patient a f/u appt for diabetes and med refills

## 2020-06-07 IMAGING — CR CHEST - 2 VIEW
2 series · 2 of 2 positions shown · non-contrast
Comparison: 03/05/2019

CLINICAL DATA: Chest pain.

EXAM:
CHEST - 2 VIEW

[chest pa]
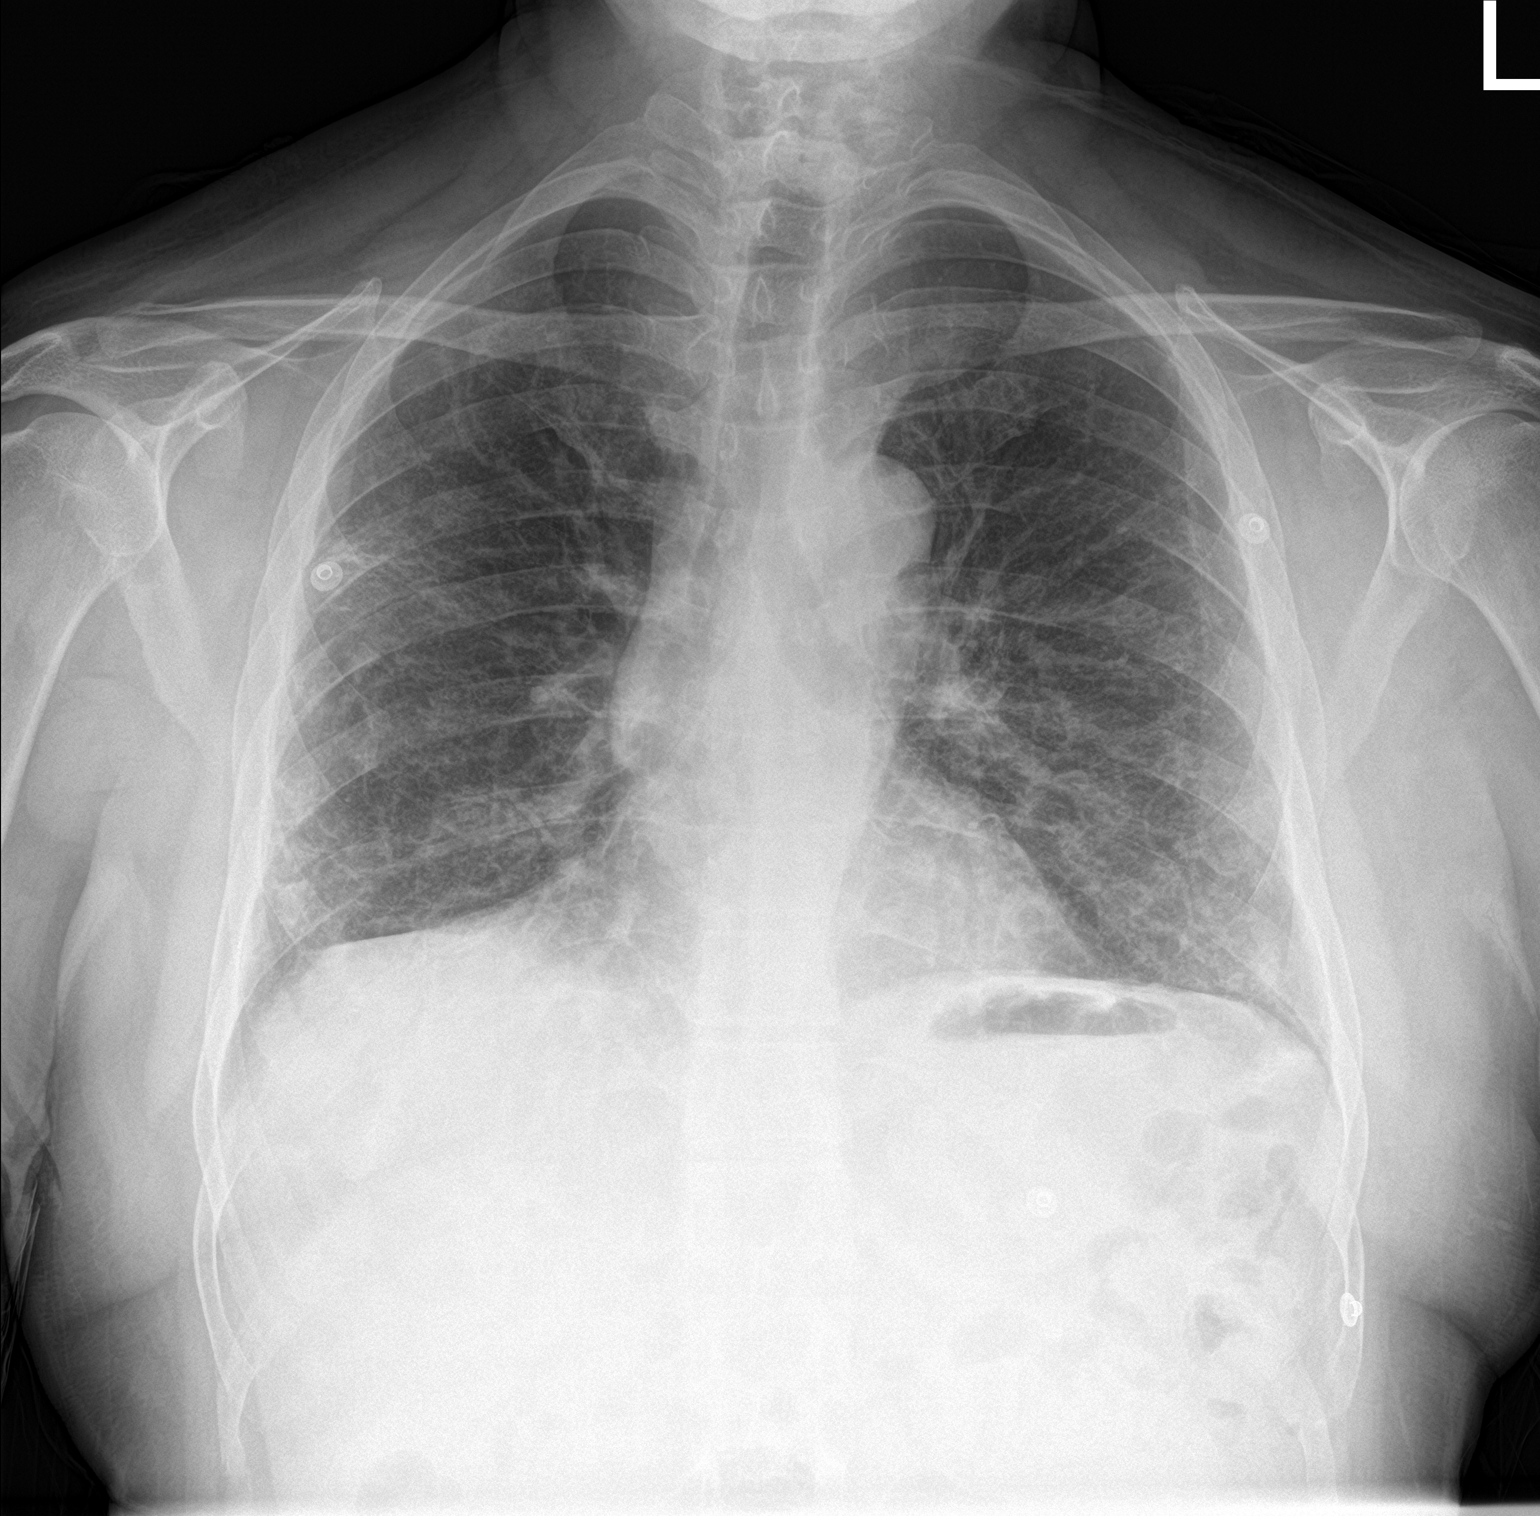

[chest lat]
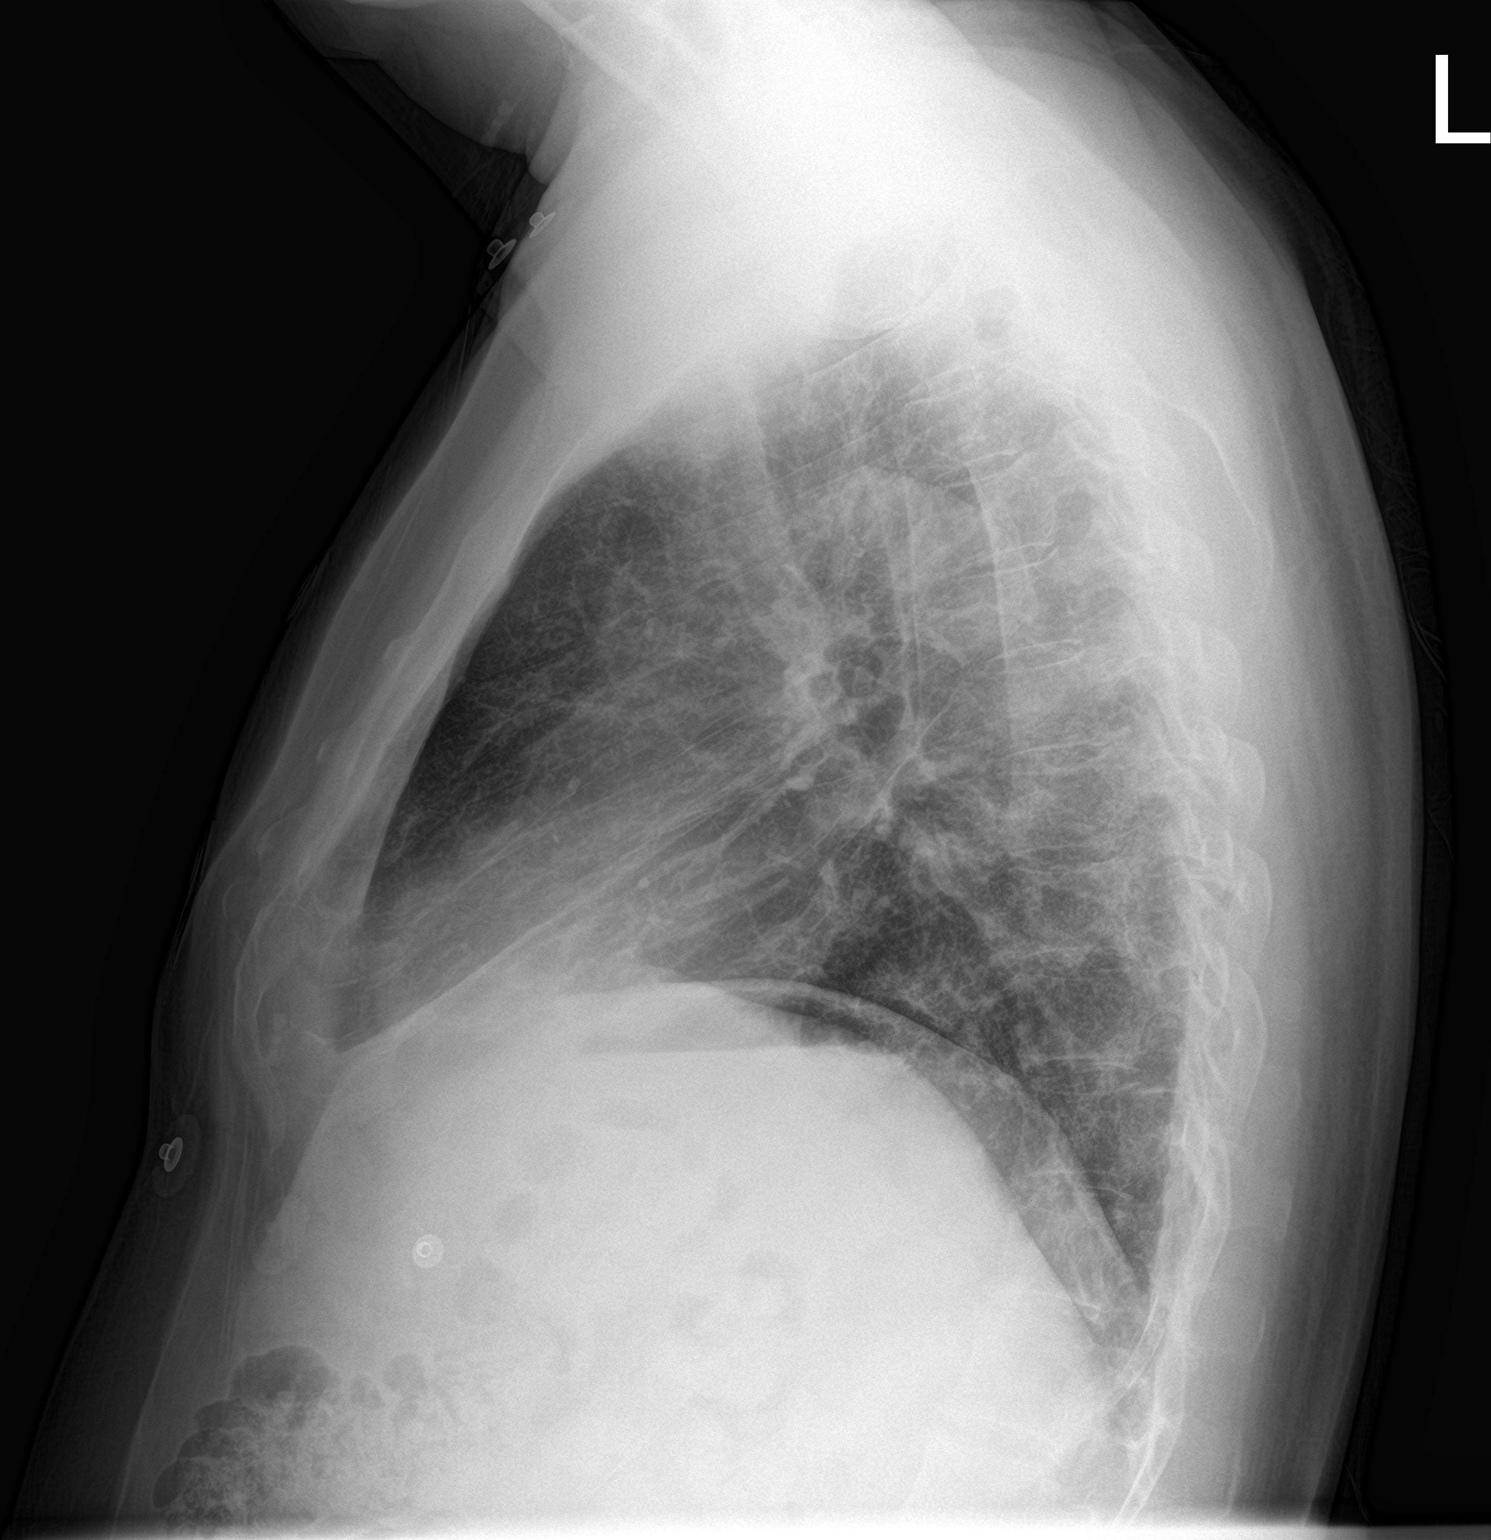

[2 of 2 positions shown; findings below may reference images not displayed]

FINDINGS: There are prominent interstitial lung markings bilaterally. No
pneumothorax. No large pleural effusion. There are a few slightly
more focal airspace opacities there is some mild blunting of the
costophrenic angles bilaterally. There is no acute osseous
abnormality.
IMPRESSION: Persistent prominent interstitial lung markings bilaterally which
are nonspecific but can be seen in patients with pulmonary edema or
an atypical infectious process. Underlying interstitial lung disease
is not excluded. A 4-6 week follow-up two-view chest x-ray is
recommended to confirm resolution of this finding.

## 2020-06-08 ENCOUNTER — Ambulatory Visit: Payer: BC Managed Care – PPO | Admitting: Emergency Medicine

## 2020-06-09 ENCOUNTER — Encounter: Payer: Self-pay | Admitting: Emergency Medicine

## 2020-06-20 ENCOUNTER — Other Ambulatory Visit: Payer: Self-pay | Admitting: Family Medicine

## 2020-06-20 DIAGNOSIS — E1165 Type 2 diabetes mellitus with hyperglycemia: Secondary | ICD-10-CM

## 2020-06-26 ENCOUNTER — Other Ambulatory Visit: Payer: Self-pay | Admitting: Emergency Medicine

## 2020-06-26 DIAGNOSIS — E1165 Type 2 diabetes mellitus with hyperglycemia: Secondary | ICD-10-CM

## 2020-08-26 ENCOUNTER — Other Ambulatory Visit: Payer: Self-pay | Admitting: Adult Health

## 2020-09-14 ENCOUNTER — Other Ambulatory Visit: Payer: Self-pay

## 2020-09-14 ENCOUNTER — Encounter: Payer: Self-pay | Admitting: Emergency Medicine

## 2020-09-14 ENCOUNTER — Ambulatory Visit (INDEPENDENT_AMBULATORY_CARE_PROVIDER_SITE_OTHER): Payer: BC Managed Care – PPO | Admitting: Emergency Medicine

## 2020-09-14 VITALS — BP 113/77 | HR 80 | Temp 97.8°F | Ht 69.0 in | Wt 191.2 lb

## 2020-09-14 DIAGNOSIS — E1169 Type 2 diabetes mellitus with other specified complication: Secondary | ICD-10-CM

## 2020-09-14 DIAGNOSIS — R079 Chest pain, unspecified: Secondary | ICD-10-CM

## 2020-09-14 DIAGNOSIS — E1165 Type 2 diabetes mellitus with hyperglycemia: Secondary | ICD-10-CM

## 2020-09-14 DIAGNOSIS — G4726 Circadian rhythm sleep disorder, shift work type: Secondary | ICD-10-CM | POA: Diagnosis not present

## 2020-09-14 DIAGNOSIS — Z8679 Personal history of other diseases of the circulatory system: Secondary | ICD-10-CM

## 2020-09-14 DIAGNOSIS — I252 Old myocardial infarction: Secondary | ICD-10-CM

## 2020-09-14 DIAGNOSIS — E785 Hyperlipidemia, unspecified: Secondary | ICD-10-CM

## 2020-09-14 MED ORDER — METFORMIN HCL 1000 MG PO TABS: 1000.0000 mg | ORAL_TABLET | Freq: Two times a day (BID) | ORAL | 3 refills | Status: DC

## 2020-09-14 MED ORDER — ROSUVASTATIN CALCIUM 40 MG PO TABS: 40.0000 mg | ORAL_TABLET | Freq: Every day | ORAL | 3 refills | Status: DC

## 2020-09-14 MED ORDER — ZOLPIDEM TARTRATE 5 MG PO TABS: 5.0000 mg | ORAL_TABLET | Freq: Every evening | ORAL | 5 refills | Status: DC | PRN

## 2020-09-14 NOTE — Progress Notes (Signed)
Wesley Davila 59 y.o.   Chief Complaint  Patient presents with  . Transitions Of Care  . Diabetes  . Chest Pain    off and on for the last couple of days.     HISTORY OF PRESENT ILLNESS: This is a 59 y.o. male with history of non-STEMI June 2020 presently on baby aspirin, Brilinta, metoprolol tartrate 50 mg twice a day. Also has history of diabetes and dyslipidemia on Metformin and rosuvastatin. Complaining of brief episodes of sharp left-sided chest pain lasting only a few seconds once a day every 2 to 3 days for the past several months.  No pain radiation and no associated symptoms.  Has not seen a cardiologist since hospital admission. Asymptomatic at present time.  HPI   Prior to Admission medications   Medication Sig Start Date End Date Taking? Authorizing Provider  aspirin 81 MG chewable tablet Chew 1 tablet (81 mg total) by mouth daily. 04/02/19  Yes Georgette Shell, MD  BRILINTA 90 MG TABS tablet TAKE 1 TABLET(90 MG) BY MOUTH TWICE DAILY 03/01/20  Yes Delia Chimes A, MD  metFORMIN (GLUCOPHAGE) 1000 MG tablet Take 1 tablet (1,000 mg total) by mouth 2 (two) times daily. 09/14/20 12/13/20 Yes Braylyn Eye, Ines Bloomer, MD  metoprolol tartrate (LOPRESSOR) 50 MG tablet TAKE 1 TABLET(50 MG) BY MOUTH TWICE DAILY 08/26/20  Yes Leonie Man, MD  [facility] ULTRA test strip USE ONCE DAILY AS DIRECTED 10/12/19  Yes Delia Chimes A, MD  rosuvastatin (CRESTOR) 40 MG tablet Take 1 tablet (40 mg total) by mouth daily. 09/14/20  Yes Patirica Longshore, Ines Bloomer, MD  zolpidem (AMBIEN) 5 MG tablet Take 1 tablet (5 mg total) by mouth at bedtime as needed for sleep. 09/14/20  Yes SagardiaInes Bloomer, MD    No Known Allergies  Patient Active Problem List   Diagnosis Date Noted  . NSTEMI (non-ST elevated myocardial infarction) ([place]) 04/01/2019  . Left ventricular dysfunction 11/21/2016  . DOE (dyspnea on exertion) 11/21/2016  . Type 2 diabetes mellitus without complication, without long-term  current use of insulin ([place]) 10/17/2016  . Hyperlipidemia LDL goal <70 10/17/2016  . Family history of cardiovascular disease 10/17/2016    Past Medical History:  Diagnosis Date  . Diabetes mellitus without complication ([place])   . Hyperlipidemia     Past Surgical History:  Procedure Laterality Date  . CORONARY STENT INTERVENTION N/A 04/01/2019   Procedure: CORONARY STENT INTERVENTION;  Surgeon: Martinique, Peter M, MD;  Location: [facility];  Service: Cardiovascular;  Laterality: N/A;  . LEFT HEART CATH AND CORONARY ANGIOGRAPHY N/A 04/01/2019   Procedure: LEFT HEART CATH AND CORONARY ANGIOGRAPHY;  Surgeon: Martinique, Peter M, MD;  Location: [facility];  Service: Cardiovascular;  Laterality: N/A;  . NM MYOVIEW LTD  09/2016   Fixed small, mild basal-mid inferior perfusion defect likely diaphragmatic attenuation. No evidence of ischemia. EF 44% with diffuse hypokinesis per computer read, but visually it would appear to be better. Considered intermediate risk.    Social History   Socioeconomic History  . Marital status: Married    Spouse name: Not on file  . Number of children: 3  . Years of education: Not on file  . Highest education level: Not on file  Occupational History  . Occupation: Agricultural consultant    Comment: Secondary school teacher  Tobacco Use  . Smoking status: Never Smoker  . Smokeless tobacco: Current User    Types: Snuff  . Tobacco comment: since 1980   Substance and  Sexual Activity  . Alcohol use: No  . Drug use: No  . Sexual activity: Not on file  Other Topics Concern  . Not on file  Social History Narrative   Has 6 brothers, 6 sisters - 2 Brothers with with DM & HTN       Social Determinants of Health   Financial Resource Strain:   . Difficulty of Paying Living Expenses: Not on file  Food Insecurity:   . Worried About Charity fundraiser in the Last Year: Not on file  . Ran Out of Food in the Last Year: Not on file  Transportation Needs:   . Lack of  Transportation (Medical): Not on file  . Lack of Transportation (Non-Medical): Not on file  Physical Activity:   . Days of Exercise per Week: Not on file  . Minutes of Exercise per Session: Not on file  Stress:   . Feeling of Stress : Not on file  Social Connections:   . Frequency of Communication with Friends and Family: Not on file  . Frequency of Social Gatherings with Friends and Family: Not on file  . Attends Religious Services: Not on file  . Active Member of Clubs or Organizations: Not on file  . Attends Archivist Meetings: Not on file  . Marital Status: Not on file  Intimate Partner Violence:   . Fear of Current or Ex-Partner: Not on file  . Emotionally Abused: Not on file  . Physically Abused: Not on file  . Sexually Abused: Not on file    Family History  Problem Relation Age of Onset  . Heart disease Father 66       MI x 2  . Stroke Father   . Hypertension Brother   . Diabetes Brother   . Hypertension Brother   . Diabetes Brother   . Breast cancer Sister   . Colon cancer Neg Hx      Review of Systems  Constitutional: Negative.  Negative for chills and fever.  HENT: Negative.   Respiratory: Negative.  Negative for cough and shortness of breath.   Cardiovascular: Positive for chest pain. Negative for palpitations and leg swelling.  Gastrointestinal: Negative.  Negative for abdominal pain, diarrhea, nausea and vomiting.  Genitourinary: Negative.  Negative for dysuria and hematuria.  Skin: Negative.  Negative for rash.  Neurological: Negative.  Negative for dizziness and headaches.  All other systems reviewed and are negative.  Today's Vitals   09/14/20 1505  BP: 113/77  Pulse: (!) 108  Temp: 97.8 F (36.6 C)  TempSrc: Temporal  SpO2: 99%  Weight: 191 lb 3.2 oz (86.7 kg)  Height: 5\' 9"  (1.753 m)   Body mass index is 28.24 kg/m.   Physical Exam Vitals reviewed.  Constitutional:      Appearance: He is well-developed.  HENT:     Head:  Normocephalic.  Eyes:     Extraocular Movements: Extraocular movements intact.     Conjunctiva/sclera: Conjunctivae normal.     Pupils: Pupils are equal, round, and reactive to light.  Cardiovascular:     Rate and Rhythm: Normal rate and regular rhythm.     Pulses: Normal pulses.     Heart sounds: Normal heart sounds.  Pulmonary:     Effort: Pulmonary effort is normal.     Breath sounds: Normal breath sounds.  Musculoskeletal:        General: Normal range of motion.     Cervical back: Normal range of motion and neck supple.  Skin:    General: Skin is warm and dry.     Capillary Refill: Capillary refill takes less than 2 seconds.  Neurological:     General: No focal deficit present.     Mental Status: He is alert and oriented to person, place, and time.  Psychiatric:        Mood and Affect: Mood normal.        Behavior: Behavior normal.     EKG: Normal sinus rhythm with ventricular rate of 98/min.  No acute ischemic changes.   A total of 45 minutes was spent with the patient, greater than 50% of which was in counseling/coordination of care regarding differential diagnosis of chest pain and possibility of unstable angina, ED precautions, review of EKG, review of most recent office visit notes, review of all medications, need to follow-up with cardiologist for further evaluation, documentation, prognosis, need for follow-up.  ASSESSMENT & PLAN: Nyair was seen today for transitions of care, diabetes and chest pain.  Diagnoses and all orders for this visit:  Chest pain, unspecified type -     EKG 12-Lead -     Ambulatory referral to Cardiology  Type 2 diabetes mellitus with hyperglycemia, without long-term current use of insulin (HCC) -     metFORMIN (GLUCOPHAGE) 1000 MG tablet; Take 1 tablet (1,000 mg total) by mouth 2 (two) times daily.  Shift work sleep disorder -     zolpidem (AMBIEN) 5 MG tablet; Take 1 tablet (5 mg total) by mouth at bedtime as needed for  sleep.  Dyslipidemia associated with type 2 diabetes mellitus (HCC) -     rosuvastatin (CRESTOR) 40 MG tablet; Take 1 tablet (40 mg total) by mouth daily.  History of coronary artery disease -     Ambulatory referral to Cardiology  History of non-ST elevation myocardial infarction (NSTEMI) -     Ambulatory referral to Cardiology    Patient Instructions  Nonspecific Chest Pain Chest pain can be caused by many different conditions. Some causes of chest pain can be life-threatening. These will require treatment right away. Serious causes of chest pain include:  Heart attack.  A tear in the body's main blood vessel.  Redness and swelling (inflammation) around your heart.  Blood clot in your lungs. Other causes of chest pain may not be so serious. These include:  Heartburn.  Anxiety or stress.  Damage to bones or muscles in your chest.  Lung infections. Chest pain can feel like:  Pain or discomfort in your chest.  Crushing, pressure, aching, or squeezing pain.  Burning or tingling.  Dull or sharp pain that is worse when you move, cough, or take a deep breath.  Pain or discomfort that is also felt in your back, neck, jaw, shoulder, or arm, or pain that spreads to any of these areas. It is hard to know whether your pain is caused by something that is serious or something that is not so serious. So it is important to see your doctor right away if you have chest pain. Follow these instructions at home: Medicines  Take over-the-counter and prescription medicines only as told by your doctor.  If you were prescribed an antibiotic medicine, take it as told by your doctor. Do not stop taking the antibiotic even if you start to feel better. Lifestyle   Rest as told by your doctor.  Do not use any products that contain nicotine or tobacco, such as cigarettes, e-cigarettes, and chewing tobacco. If you need help quitting, ask  your doctor.  Do not drink alcohol.  Make  lifestyle changes as told by your doctor. These may include: ? Getting regular exercise. Ask your doctor what activities are safe for you. ? Eating a heart-healthy diet. A diet and nutrition specialist (dietitian) can help you to learn healthy eating options. ? Staying at a healthy weight. ? Treating diabetes or high blood pressure, if needed. ? Lowering your stress. Activities such as yoga and relaxation techniques can help. General instructions  Pay attention to any changes in your symptoms. Tell your doctor about them or any new symptoms.  Avoid any activities that cause chest pain.  Keep all follow-up visits as told by your doctor. This is important. You may need more testing if your chest pain does not go away. Contact a doctor if:  Your chest pain does not go away.  You feel depressed.  You have a fever. Get help right away if:  Your chest pain is worse.  You have a cough that gets worse, or you cough up blood.  You have very bad (severe) pain in your belly (abdomen).  You pass out (faint).  You have either of these for no clear reason: ? Sudden chest discomfort. ? Sudden discomfort in your arms, back, neck, or jaw.  You have shortness of breath at any time.  You suddenly start to sweat, or your skin gets clammy.  You feel sick to your stomach (nauseous).  You throw up (vomit).  You suddenly feel lightheaded or dizzy.  You feel very weak or tired.  Your heart starts to beat fast, or it feels like it is skipping beats. These symptoms may be an emergency. Do not wait to see if the symptoms will go away. Get medical help right away. Call your local emergency services (911 in the [place] Do not drive yourself to the hospital. Summary  Chest pain can be caused by many different conditions. The cause may be serious and need treatment right away. If you have chest pain, see your doctor right away.  Follow your doctor's instructions for taking medicines and making  lifestyle changes.  Keep all follow-up visits as told by your doctor. This includes visits for any further testing if your chest pain does not go away.  Be sure to know the signs that show that your condition has become worse. Get help right away if you have these symptoms. This information is not intended to replace advice given to you by your health care provider. Make sure you discuss any questions you have with your health care provider. Document Revised: 04/17/2018 Document Reviewed: 04/17/2018 Elsevier Patient Education  2020 Elsevier Inc.      Agustina Caroli, MD [facility]

## 2020-09-14 NOTE — Patient Instructions (Signed)

## 2020-09-15 NOTE — Telephone Encounter (Signed)
Pt asking what EKG means I see you referred to cardiology but that it is documented as non specific chest pain. Defer to cardiology?

## 2020-09-15 NOTE — Telephone Encounter (Signed)
We did an electrocardiogram yesterday which looked pretty normal.  He has a history however of non-STEMI last year and needs cardiology follow-up.  Thanks.

## 2020-09-28 ENCOUNTER — Other Ambulatory Visit: Payer: Self-pay | Admitting: *Deleted

## 2020-09-28 DIAGNOSIS — E119 Type 2 diabetes mellitus without complications: Secondary | ICD-10-CM

## 2020-09-28 MED ORDER — ONETOUCH ULTRA VI STRP: ORAL_STRIP | 1 refills | Status: AC

## 2020-10-25 ENCOUNTER — Other Ambulatory Visit: Payer: Self-pay

## 2020-10-25 ENCOUNTER — Encounter: Payer: Self-pay | Admitting: Physician Assistant

## 2020-10-25 ENCOUNTER — Ambulatory Visit (INDEPENDENT_AMBULATORY_CARE_PROVIDER_SITE_OTHER): Payer: BC Managed Care – PPO | Admitting: Physician Assistant

## 2020-10-25 VITALS — BP 142/90 | HR 80 | Ht 69.0 in | Wt 197.0 lb

## 2020-10-25 DIAGNOSIS — E785 Hyperlipidemia, unspecified: Secondary | ICD-10-CM

## 2020-10-25 DIAGNOSIS — E119 Type 2 diabetes mellitus without complications: Secondary | ICD-10-CM

## 2020-10-25 DIAGNOSIS — I2510 Atherosclerotic heart disease of native coronary artery without angina pectoris: Secondary | ICD-10-CM

## 2020-10-25 DIAGNOSIS — R55 Syncope and collapse: Secondary | ICD-10-CM | POA: Diagnosis not present

## 2020-10-25 NOTE — Patient Instructions (Signed)
Medication Instructions:   STOP Brilinta  *If you need a refill on your cardiac medications before your next appointment, please call your pharmacy*  Lab Work: NONE ordered at this time of appointment   If you have labs (blood work) drawn today and your tests are completely normal, you will receive your results only by: [facility] Kitchen MyChart Message (if you have MyChart) OR . A paper copy in the mail If you have any lab test that is abnormal or we need to change your treatment, we will call you to review the results.  Testing/Procedures: ZIO AT Long term monitor-Live Telemetry  Your physician has requested you wear a ZIO patch monitor for 14 days.  This is a single patch monitor. [employer] supplies one patch monitor per enrollment. Additional stickers are not available.  Please do not apply patch if you will be having a Nuclear Stress Test, Echocardiogram, Cardiac CT, MRI, or Chest Xray during the time frame you would be wearing the monitor. The patch cannot be worn during these tests. You cannot remove and re-apply the ZIO AT patch monitor.   Your ZIO patch monitor will be sent Fed Ex from [employer] directly to your home address. The monitor may also be mailed to a PO BOX if home delivery is not available. It may take 3-5 days to receive your monitor after you have been enrolled.  Once you have received you monitor, please review enclosed instructions. Your monitor has already been registered assigning a specific monitor serial # to you.   Applying the monitor  Shave hair from upper left chest.  Hold abrader disc by orange tab. Rub abrader in 40 strokes over left upper chest as indicated in your monitor instructions.  Clean area with 4 enclosed alcohol pads. Use all pads to ensure the area is cleaned thoroughly. Let dry.  Apply patch as indicated in monitor instructions. Patch will be placed under collarbone on left side of chest with arrow pointing upward.  Rub patch adhesive wings for 2  minutes. Remove the white label marked "1". Remove the white label marked "2". Rub patch adhesive wings for 2 additional minutes.  While looking in a mirror, press and release button in center of patch. A small green light will flash 3-4 times. This will be your only indicator the monitor has been turned on.  Do not shower for the first 24 hours. You may shower after the first 24 hours.  Press the button if you feel a symptom. You will hear a small click. Record Date, Time and Symptom in the Patient Log.   Starting the Gateway  In your kit there is a Hydrographic surveyor box the size of a cellphone. This is Airline pilot. It transmits all your recorded data to [facility] This box must stay within 10 feet of you at all times. Open the box and push the * button. There will be a light that blinks orange and then green a few times. When the light stops blinking, the Gateway is connected to the ZIO patch.  Call [employer] at [phone] to confirm your monitor is transmitting.   Returning your monitor  Remove your patch and place it inside the [place]. In the lower half of the Gateway there is a white bag with prepaid postage on it. Place Gateway in bag and seal. Mail package back to [facility] as soon as possible. Your physician should have your final report approximately 7 days after you have mailed back your monitor.   Call [employer] Customer Care at [phone] if you have questions regarding your ZIO AT patch monitor. Call them immediately if you see an orange light blinking on your monitor.  If your monitor falls off in less than 4 days contact our Monitor department at [phone]. If your monitor becomes loose or falls off after 4 days call [employer] at [phone] for suggestions on securing your monitor.   Follow-Up: At [facility], you and your health needs are our priority.  As part of our continuing mission to provide you with exceptional heart care, we have created designated Provider  Care Teams.  These Care Teams include your primary Cardiologist (physician) and Advanced Practice Providers (APPs -  Physician Assistants and Nurse Practitioners) who all work together to provide you with the care you need, when you need it.  Your next appointment:   2 month(s)  The format for your next appointment:   In Person  Provider:   Glenetta Hew, MD  Other Instructions

## 2020-10-25 NOTE — Progress Notes (Signed)
Cardiology Office Note:    Date:  10/26/2020   ID:  Wesley Davila, DOB [birth year], MRN [id number]  PCP:  Horald Pollen, MD  [place] Cardiologist:  Glenetta Hew, MD  [place] Electrophysiologist:  None   Referring MD: Horald Pollen, *   Chief Complaint  Patient presents with  . Follow-up    Seen for Dr. Ellyn Hack    History of Present Illness:    Wesley Davila is a 59 y.o. male with a hx of hyperlipidemia, DM2 and CAD.  Patient was established with cardiology service in December 2017 due to chest discomfort.  He was referred for a Myoview test which came back showing EF 44%, fixed small mild basal to mid inferior perfusion defect possible diaphragmatic attenuation, no evidence of ischemia.  Subsequent echocardiogram obtained in February 2018 showed EF 55 to 60%, no significant valve issue.  Patient unfortunately was admitted with NSTEMI in June 2020.  Echocardiogram performed on 04/01/2019 showed EF 55 to 60%, no significant wall motion abnormality, mild to moderate AI.  Subsequent cardiac catheterization performed on 04/01/2019 showed single-vessel CAD, 60% distal LAD lesion, 90% proximal to mid left circumflex lesion treated with a resolute Onyx 3.5 x 12 mm DES, no significant disease in left circumflex or RCA, EF 55 to 65% by visual estimate.  Crestor was increased to 40 mg daily.  LDL was 156 during hospitalization, total cholesterol 272, triglyceride 201.  Patient was followed up virtually by Bunnie Domino on 04/14/2019 at which time he was doing well.  He was cleared to return to work and follow-up in 3 months.  Unfortunately he failed to follow-up.  Last lipid panel obtained in October 2020 showed significantly improved cholesterol profile with total cholesterol 145, HDL 55, triglyceride 169, LDL 62.  Recently, he was seen by his PCP in November 2021 at which time he complained of brief episode of left-sided chest discomfort that only last a few seconds.  He  was instructed to follow-up with cardiology service.  Patient presents today for evaluation presyncope.  So far he has had 2 episodes recently.  Both episodes only lasted about 1 to 2-minute and occurred at rest.  He denies any recent shortness of breath or chest discomfort.  He has no lower extremity edema, orthopnea or PND.  I recommend a heart monitor to further assess.   Past Medical History:  Diagnosis Date  . Diabetes mellitus without complication ([place])   . Hyperlipidemia     Past Surgical History:  Procedure Laterality Date  . CORONARY STENT INTERVENTION N/A 04/01/2019   Procedure: CORONARY STENT INTERVENTION;  Surgeon: Martinique, Peter M, MD;  Location: [facility];  Service: Cardiovascular;  Laterality: N/A;  . LEFT HEART CATH AND CORONARY ANGIOGRAPHY N/A 04/01/2019   Procedure: LEFT HEART CATH AND CORONARY ANGIOGRAPHY;  Surgeon: Martinique, Peter M, MD;  Location: [place] [facility];  Service: Cardiovascular;  Laterality: N/A;  . NM MYOVIEW LTD  09/2016   Fixed small, mild basal-mid inferior perfusion defect likely diaphragmatic attenuation. No evidence of ischemia. EF 44% with diffuse hypokinesis per computer read, but visually it would appear to be better. Considered intermediate risk.    Current Medications: Current Meds  Medication Sig  . aspirin 81 MG chewable tablet Chew 1 tablet (81 mg total) by mouth daily.  Marland Kitchen BRILINTA 90 MG TABS tablet TAKE 1 TABLET(90 MG) BY MOUTH TWICE DAILY  . glucose blood (ONETOUCH ULTRA) test strip USE ONCE DAILY AS DIRECTED  . metFORMIN (  GLUCOPHAGE) 1000 MG tablet Take 1 tablet (1,000 mg total) by mouth 2 (two) times daily.  . metoprolol tartrate (LOPRESSOR) 50 MG tablet TAKE 1 TABLET(50 MG) BY MOUTH TWICE DAILY  . rosuvastatin (CRESTOR) 40 MG tablet Take 1 tablet (40 mg total) by mouth daily.  Marland Kitchen zolpidem (AMBIEN) 5 MG tablet Take 1 tablet (5 mg total) by mouth at bedtime as needed for sleep.     Allergies:   Patient has no known allergies.    Social History   Socioeconomic History  . Marital status: Married    Spouse name: Not on file  . Number of children: 3  . Years of education: Not on file  . Highest education level: Not on file  Occupational History  . Occupation: Agricultural consultant    Comment: Secondary school teacher  Tobacco Use  . Smoking status: Never Smoker  . Smokeless tobacco: Current User    Types: Snuff  . Tobacco comment: since 1980   Substance and Sexual Activity  . Alcohol use: No  . Drug use: No  . Sexual activity: Not on file  Other Topics Concern  . Not on file  Social History Narrative   Has 6 brothers, 6 sisters - 2 Brothers with with DM & HTN       Social Determinants of Health   Financial Resource Strain: Not on file  Food Insecurity: Not on file  Transportation Needs: Not on file  Physical Activity: Not on file  Stress: Not on file  Social Connections: Not on file     Family History: The patient's family history includes Breast cancer in his sister; Diabetes in his brother and brother; Heart disease (age of onset: 65) in his father; Hypertension in his brother and brother; Stroke in his father. There is no history of Colon cancer.  ROS:   Please see the history of present illness.     All other systems reviewed and are negative.  EKGs/Labs/Other Studies Reviewed:    The following studies were reviewed today:  Cath 04/01/2019  Dist LAD lesion is 60% stenosed.  Prox Cx to Mid Cx lesion is 90% stenosed.  Post intervention, there is a 0% residual stenosis.  A drug-eluting stent was successfully placed using a STENT RESOLUTE ONYX 3.5X12.  The left ventricular systolic function is normal.  LV end diastolic pressure is normal.  The left ventricular ejection fraction is 55-65% by visual estimate.   1. Single vessel obstructive CAD 2. Normal LV function 3. Normal LV filling pressure 4. Successful PCI of the mid LCx with DES x 1  Plan: DAPT for one year. Risk factor modification.  Anticipate DC tomorrow.     EKG:  EKG is ordered today.  The ekg ordered today demonstrates normal sinus rhythm without significant ST-T wave changes.  Recent Labs: No results found for requested labs within last 8760 hours.  Recent Lipid Panel    Component Value Date/Time   CHOL 145 08/10/2019 1614   TRIG 169 (H) 08/10/2019 1614   HDL 55 08/10/2019 1614   CHOLHDL 2.6 08/10/2019 1614   CHOLHDL 5.0 04/01/2019 0318   VLDL 60 (H) 04/01/2019 0318   LDLCALC 62 08/10/2019 1614     Risk Assessment/Calculations:       Physical Exam:    VS:  BP (!) 142/90   Pulse 80   Ht 5' 9"  (1.753 m)   Wt 197 lb (89.4 kg)   SpO2 99%   BMI 29.09 kg/m     Wt Readings from  Last 3 Encounters:  10/25/20 197 lb (89.4 kg)  09/14/20 191 lb 3.2 oz (86.7 kg)  09/21/19 193 lb 6.4 oz (87.7 kg)     GEN:  Well nourished, well developed in no acute distress HEENT: Normal NECK: No JVD; No carotid bruits LYMPHATICS: No lymphadenopathy CARDIAC: RRR, no murmurs, rubs, gallops RESPIRATORY:  Clear to auscultation without rales, wheezing or rhonchi  ABDOMEN: Soft, non-tender, non-distended MUSCULOSKELETAL:  No edema; No deformity  SKIN: Warm and dry NEUROLOGIC:  Alert and oriented x 3 PSYCHIATRIC:  Normal affect   ASSESSMENT:    1. Pre-syncope   2. Coronary artery disease involving native coronary artery of native heart without angina pectoris   3. Hyperlipidemia LDL goal <70   4. Controlled type 2 diabetes mellitus without complication, without long-term current use of insulin (HCC)    PLAN:    In order of problems listed above:  1. Presyncope: So far he has had 2 episodes of presyncope, both occurred at rest and the last about 1 to 2-minute.  He denies any chest pain or shortness of breath.  I recommended 2 weeks Zio monitor  2. CAD: Last PCI in 2020.  He has not had any similar anginal symptoms.  Continue aspirin, he may stop Brilinta at this point  3. Hyperlipidemia: On Crestor  4. DM2:  Managed by primary care provider.        Medication Adjustments/Labs and Tests Ordered: Current medicines are reviewed at length with the patient today.  Concerns regarding medicines are outlined above.  Orders Placed This Encounter  Procedures  . LONG TERM MONITOR (3-14 DAYS)  . EKG 12-Lead   No orders of the defined types were placed in this encounter.   Patient Instructions  Medication Instructions:   STOP Brilinta  *If you need a refill on your cardiac medications before your next appointment, please call your pharmacy*  Lab Work: NONE ordered at this time of appointment   If you have labs (blood work) drawn today and your tests are completely normal, you will receive your results only by: Marland Kitchen MyChart Message (if you have MyChart) OR . A paper copy in the mail If you have any lab test that is abnormal or we need to change your treatment, we will call you to review the results.  Testing/Procedures: ZIO AT Long term monitor-Live Telemetry  Your physician has requested you wear a ZIO patch monitor for 14 days.  This is a single patch monitor. [employer] supplies one patch monitor per enrollment. Additional stickers are not available.  Please do not apply patch if you will be having a Nuclear Stress Test, Echocardiogram, Cardiac CT, MRI, or Chest Xray during the time frame you would be wearing the monitor. The patch cannot be worn during these tests. You cannot remove and re-apply the ZIO AT patch monitor.   Your ZIO patch monitor will be sent Fed Ex from [employer] directly to your home address. The monitor may also be mailed to a PO BOX if home delivery is not available. It may take 3-5 days to receive your monitor after you have been enrolled.  Once you have received you monitor, please review enclosed instructions. Your monitor has already been registered assigning a specific monitor serial # to you.   Applying the monitor  Shave hair from upper left chest.  Hold  abrader disc by orange tab. Rub abrader in 40 strokes over left upper chest as indicated in your monitor instructions.  Clean area with 4  enclosed alcohol pads. Use all pads to ensure the area is cleaned thoroughly. Let dry.  Apply patch as indicated in monitor instructions. Patch will be placed under collarbone on left side of chest with arrow pointing upward.  Rub patch adhesive wings for 2 minutes. Remove the white label marked "1". Remove the white label marked "2". Rub patch adhesive wings for 2 additional minutes.  While looking in a mirror, press and release button in center of patch. A small green light will flash 3-4 times. This will be your only indicator the monitor has been turned on.  Do not shower for the first 24 hours. You may shower after the first 24 hours.  Press the button if you feel a symptom. You will hear a small click. Record Date, Time and Symptom in the Patient Log.   Starting the Gateway  In your kit there is a Hydrographic surveyor box the size of a cellphone. This is Airline pilot. It transmits all your recorded data to [facility]. This box must stay within 10 feet of you at all times. Open the box and push the * button. There will be a light that blinks orange and then green a few times. When the light stops blinking, the Gateway is connected to the ZIO patch.  Call [employer] at [phone] to confirm your monitor is transmitting.   Returning your monitor  Remove your patch and place it inside the [facility]. In the lower half of the Gateway there is a white bag with prepaid postage on it. Place Gateway in bag and seal. Mail package back to [facility] as soon as possible. Your physician should have your final report approximately 7 days after you have mailed back your monitor.   Call [facility] at [phone] if you have questions regarding your ZIO AT patch monitor. Call them immediately if you see an orange light blinking on your monitor.  If your monitor  falls off in less than 4 days contact our Monitor department at [phone]. If your monitor becomes loose or falls off after 4 days call [employer] at [phone] for suggestions on securing your monitor.   Follow-Up: At [facility], you and your health needs are our priority.  As part of our continuing mission to provide you with exceptional heart care, we have created designated Provider Care Teams.  These Care Teams include your primary Cardiologist (physician) and Advanced Practice Providers (APPs -  Physician Assistants and Nurse Practitioners) who all work together to provide you with the care you need, when you need it.  Your next appointment:   2 month(s)  The format for your next appointment:   In Person  Provider:   Glenetta Hew, MD  Other Instructions      Signed, Almyra Deforest, [place]  10/26/2020 11:58 PM    [place]

## 2020-10-26 ENCOUNTER — Ambulatory Visit (INDEPENDENT_AMBULATORY_CARE_PROVIDER_SITE_OTHER): Payer: BC Managed Care – PPO

## 2020-10-26 ENCOUNTER — Encounter: Payer: Self-pay | Admitting: Physician Assistant

## 2020-10-26 DIAGNOSIS — R55 Syncope and collapse: Secondary | ICD-10-CM

## 2020-10-29 DIAGNOSIS — R9431 Abnormal electrocardiogram [ECG] [EKG]: Secondary | ICD-10-CM

## 2020-10-29 HISTORY — DX: Abnormal electrocardiogram (ECG) (EKG): R94.31

## 2020-11-02 DIAGNOSIS — R55 Syncope and collapse: Secondary | ICD-10-CM | POA: Diagnosis not present

## 2020-11-24 NOTE — Progress Notes (Signed)
Nothing on the heart monitor to explain near passing out spells, if he is still having them, then will need neurology evaluation. If no further recurrence, then follow up with Dr. Ellyn Hack as scheduled. Note, home phone number is Epic is wrong person.

## 2020-12-27 ENCOUNTER — Ambulatory Visit: Payer: BC Managed Care – PPO | Admitting: Cardiology

## 2020-12-27 NOTE — Progress Notes (Deleted)
Primary Care Provider: Horald Pollen, MD Cardiologist: Glenetta Hew, MD Electrophysiologist: None  Clinic Note: No chief complaint on file.   ===================================  ASSESSMENT/PLAN   Problem List Items Addressed This Visit   None     ===================================  HPI:    Wesley Davila is a 60 y.o. male with a PMH notable for CAD having PCI (non-STEMI in 2010), HLD, DM-2 who presents today for ***.   Non-STEMI June 2020 -was only seen via telemedicine June 16 by Bunnie Domino, NP; he was then lost to follow-up,   Wesley Davila was last seen on October 25, 2020 for presyncope; also had an episode of left-sided chest discomfort in November 2021 lasting a few seconds.  He also had 2 episodes of presyncope lasting 1 to 2 minutes. => Ordered a Holter monitor.  Recent Hospitalizations: ***  Reviewed  CV studies:    The following studies were reviewed today: (if available, images/films reviewed: From Epic Chart or Care Everywhere) . Echo December 05, 2016: Technically difficult.  Poor windows.  EF estimated 55 to 60%.  Unable to assess diastolic function.  Normal LA size.  Normal CVP. Marland Kitchen Echo April 01, 2019 non-STEMI: EF 55 to 60%.  Impaired relaxation/G1 DD.  No R WMA.  Mild aortic valve sclerosis but no stenosis. . Cath PCI April 01, 2019: Normal LVEDP.  EF 55-65%.  dLAD 60%; p-mLCx 90% => PCI Resolute Onyx DES 3.5 x12 (3.62mm)    Interval History:   Wesley Davila   CV Review of Symptoms (Summary) Cardiovascular ROS: {roscv:310661}  The patient {does/does not:200015} have symptoms concerning for COVID-19 infection (fever, chills, cough, or new shortness of breath).   REVIEWED OF SYSTEMS   ROS   I have reviewed and (if needed) personally updated the patient's problem list, medications, allergies, past medical and surgical history, social and family history.   PAST MEDICAL HISTORY   Past Medical History:  Diagnosis Date  .  Diabetes mellitus without complication ([place])   . Hyperlipidemia     PAST SURGICAL HISTORY   Past Surgical History:  Procedure Laterality Date  . CORONARY STENT INTERVENTION N/A 04/01/2019   Procedure: CORONARY STENT INTERVENTION;  Surgeon: Martinique, Peter M, MD;  Location: [place] [facility];  Service: Cardiovascular;  Laterality: N/A;  . LEFT HEART CATH AND CORONARY ANGIOGRAPHY N/A 04/01/2019   Procedure: LEFT HEART CATH AND CORONARY ANGIOGRAPHY;  Surgeon: Martinique, Peter M, MD;  Location: [place] [facility];  Service: Cardiovascular;  Laterality: N/A;  . NM MYOVIEW LTD  09/2016   Fixed small, mild basal-mid inferior perfusion defect likely diaphragmatic attenuation. No evidence of ischemia. EF 44% with diffuse hypokinesis per computer read, but visually it would appear to be better. Considered intermediate risk.    Immunization History  Administered Date(s) Administered  . Influenza,inj,Quad PF,6+ Mos 08/10/2019  . Influenza-Unspecified 07/29/2017  . Pneumococcal Conjugate-13 10/11/2015  . Pneumococcal Polysaccharide-23 06/25/2017  . Tdap 10/11/2015    MEDICATIONS/ALLERGIES   No outpatient medications have been marked as taking for the 12/27/20 encounter (Appointment) with Leonie Man, MD.    No Known Allergies  SOCIAL HISTORY/FAMILY HISTORY   Reviewed in Epic:  Pertinent findings:  Social History   Tobacco Use  . Smoking status: Never Smoker  . Smokeless tobacco: Current User    Types: Snuff  . Tobacco comment: since 1980   Substance Use Topics  . Alcohol use: No  . Drug use: No   Social History   Social History Narrative  Has 6 brothers, 6 sisters - 2 Brothers with with DM & HTN        OBJCTIVE -PE, EKG, labs   Wt Readings from Last 3 Encounters:  10/25/20 197 lb (89.4 kg)  09/14/20 191 lb 3.2 oz (86.7 kg)  09/21/19 193 lb 6.4 oz (87.7 kg)    Physical Exam: There were no vitals taken for this visit. Physical Exam   Adult ECG Report  Rate: *** ;   Rhythm: {rhythm:17366};   Narrative Interpretation: ***  Recent Labs:  ***  Lab Results  Component Value Date   CHOL 145 08/10/2019   HDL 55 08/10/2019   LDLCALC 62 08/10/2019   TRIG 169 (H) 08/10/2019   CHOLHDL 2.6 08/10/2019   Lab Results  Component Value Date   CREATININE 0.95 08/10/2019   BUN 13 08/10/2019   NA 138 08/10/2019   K 4.1 08/10/2019   CL 102 08/10/2019   CO2 21 08/10/2019   CBC Latest Ref Rng & Units 04/08/2019 04/02/2019 04/01/2019  WBC 3.4 - 10.8 x10E3/uL 6.4 6.2 4.7  Hemoglobin 13.0 - 17.7 g/dL 13.4 13.4 12.9(L)  Hematocrit 37.5 - 51.0 % 40.1 40.4 39.8  Platelets 150 - 450 x10E3/uL 388 358 392    Lab Results  Component Value Date   TSH 1.62 09/17/2016    ==================================================  COVID-19 Education: The signs and symptoms of COVID-19 were discussed with the patient and how to seek care for testing (follow up with PCP or arrange E-visit).   The importance of social distancing and COVID-19 vaccination was discussed today. The patient {ACTION; IS/IS PQZ:[id number]} practicing social distancing & Masking.   I spent a total of ***minutes with the patient spent in direct patient consultation.  Additional time spent with chart review  / charting (studies, outside notes, etc): *** min Total Time: *** min   Current medicines are reviewed at length with the patient today.  (+/- concerns) ***  This visit occurred during the SARS-CoV-2 public health emergency.  Safety protocols were in place, including screening questions prior to the visit, additional usage of staff PPE, and extensive cleaning of exam room while observing appropriate contact time as indicated for disinfecting solutions.  Notice: This dictation was prepared with Dragon dictation along with smaller phrase technology. Any transcriptional errors that result from this process are unintentional and may not be corrected upon review.  Patient Instructions / Medication Changes &  Studies & Tests Ordered   There are no Patient Instructions on file for this visit.   Studies Ordered:   No orders of the defined types were placed in this encounter.    Glenetta Hew, M.D., M.S. Interventional Cardiologist   Pager # [phone] Phone # [phone] [address] [place], [place] [address]   Thank you for choosing Heartcare at [facility]!!

## 2021-02-18 ENCOUNTER — Other Ambulatory Visit: Payer: Self-pay | Admitting: Adult Health

## 2021-03-16 ENCOUNTER — Ambulatory Visit: Payer: BC Managed Care – PPO | Admitting: Cardiology

## 2021-03-28 ENCOUNTER — Ambulatory Visit: Payer: BC Managed Care – PPO | Admitting: Cardiology

## 2021-07-05 ENCOUNTER — Ambulatory Visit: Payer: BC Managed Care – PPO | Admitting: Cardiology

## 2021-07-05 ENCOUNTER — Other Ambulatory Visit: Payer: Self-pay

## 2021-07-05 ENCOUNTER — Ambulatory Visit (INDEPENDENT_AMBULATORY_CARE_PROVIDER_SITE_OTHER): Payer: BC Managed Care – PPO | Admitting: Cardiology

## 2021-07-05 VITALS — BP 122/86 | HR 90 | Ht 69.0 in | Wt 195.6 lb

## 2021-07-05 DIAGNOSIS — I214 Non-ST elevation (NSTEMI) myocardial infarction: Secondary | ICD-10-CM

## 2021-07-05 DIAGNOSIS — Z9861 Coronary angioplasty status: Secondary | ICD-10-CM

## 2021-07-05 DIAGNOSIS — E785 Hyperlipidemia, unspecified: Secondary | ICD-10-CM

## 2021-07-05 DIAGNOSIS — I2510 Atherosclerotic heart disease of native coronary artery without angina pectoris: Secondary | ICD-10-CM | POA: Diagnosis not present

## 2021-07-05 DIAGNOSIS — E1169 Type 2 diabetes mellitus with other specified complication: Secondary | ICD-10-CM

## 2021-07-05 DIAGNOSIS — I519 Heart disease, unspecified: Secondary | ICD-10-CM

## 2021-07-05 NOTE — Progress Notes (Signed)
Primary Care Provider: Horald Pollen, MD Cardiologist: Glenetta Hew, MD Electrophysiologist: None  Clinic Note: Chief Complaint  Patient presents with   Follow-up    Doing well.  No major symptoms.   Coronary Artery Disease    No angina or dyspnea with exercise.  No CHF symptoms.   Near Syncope    No further episodes since last visit.   ===================================  ASSESSMENT/PLAN   Problem List Items Addressed This Visit       Cardiology Problems   History of NSTEMI - Primary (Chronic)    Wesley Davila, Wesley this is the First time I seen him.  Wesley had lots of questions about pathophysiology, Wesley prognosis.  Thankfully, Wesley really is not having any symptoms.  Has not had any angina or heart failure symptoms since.      Relevant Orders   Comprehensive metabolic panel (Completed)   2v CAD S/P DES PCI LCx, med Rx dLAD 60%. (Chronic)    Now 2 years out from his MI Wesley PCI.  No longer on Thienopyridine.  Continue maintenance 81 mg aspirin-okay to hold Plavix 7 days preop for surgery procedures      Relevant Orders   Lipid panel (Completed)   Comprehensive metabolic panel (Completed)   Hemoglobin A1c (Completed)   Coronary artery disease involving native coronary artery without angina pectoris (Chronic)     Anything gentleman name two-vessel disease with the circumflex stented Wesley distal LAD main treated medically.  No active anginal symptoms.  Thienopyridine was discontinued, Wesley remains on aspirin  Plan: Wesley is on moderate dose of metoprolol Wesley high-dose rosuvastatin -> with resting heart rate 90 beats minute, would like to potentially get it better controlled by increasing beta-blocker, but since Wesley is relatively asymptomatic we will hold off for now.. Continue maintenance 81 mg aspirin-okay to hold Plavix 7 days preop for surgery procedures      Relevant Orders   Comprehensive metabolic panel (Completed)   Hemoglobin  A1c (Completed)     Other   Hyperlipidemia associated with type 2 diabetes mellitus (HCC) (Chronic)    Last available lipids Wesley A1c were from October 2020.  LDL actually 62 Wesley A1c was 7.6 at that time.  Wesley remains on rosuvastatin 40 mg.    Plan: We will check FLP, CMP but also check A1c Wesley CBC. Pending lab results, continue current dose of rosuvastatin Wesley is only on metformin.  With coronary disease, would likely benefit from SGLT2 inhibitor.  If A1c not at goal, would add [place] or Jardiance.      Relevant Orders   Lipid panel (Completed)   Comprehensive metabolic panel (Completed)   Hemoglobin A1c (Completed)    ===================================  HPI:    Wesley Davila is a 60 y.o. male with a PMH notable for CAD (NSTEMI 03/2019 -> DES PCI p-m LCx & Med Rx of dLAD 60%) with CRFs of DM2/HTH/HLD who presents today for ~10 month CV f/u.  09/2016 - CP Evaluation w/ Myoview (EF 44%, fixed small mid-basal-mid inferior defect - diaphragmatic attenutaion -> Echo EF 55-60%.) 03/2019 - NSTEMI -> DES PCI p-mLCx (Resolute Onyx 3.5 x 12); Echo EF 55-60% no RWMA.  LDL 156 -> Crestor dose increased to 40 mg. .  Wesley Davila was last seen on October 25, 2020 by Almyra Deforest, PA-C (prior to that, Wesley was seen via telemedicine shortly after his Davila in June 2020).-not for "CAD", but at the request of PCP  in response to a visit in November 2021 with a brief episode of left-sided discomfort Wesley near syncope.  The chest discomfort episode was only lasting for few seconds, therefore no stress testing was performed, but the musical episode lasted about 1 to 2 minutes.   2-week Zio patch Event MonitorOrdered Brilinta discontinued  Recent Hospitalizations: None  Reviewed  CV studies:    The following studies were reviewed today: (if available, images/films reviewed: From Epic Chart or Care Everywhere) Event Monitor 11/24/2020:: Essentially normal, no arrhythmias.  Rare PACs Wesley PVCs. Essentially  normal monitor. No signs of arrhythmia. Rare PVCs Wesley PACs with some couplets Wesley triplets. 15 patient triggered events: Mostly associated with sinus rhythm Wesley low weight sinus tachycardia; associated with PVCs.   Echo 04/28/2019: EF 55 to 60%.  Impaired relaxation.  No R WMA.  Mild aortic valve sclerosis with no stenosis.  Normal RV. Cardiac Catheterization Wesley PCI 04/01/2019: Distal LAD 60%.  Proximal mid LCx 90%-(DES PCI Resolute Onyx 3.5 x 12).  EF 55 to 60%.  Interval History:   [name] presents here today for the first time I have seen Wesley since his heart attack stating that Wesley is doing pretty well.  Wesley has off Wesley on mild skipped beats but nothing prolonged nothing really worrisome.  Wesley says Wesley gets Davila of breath if Wesley works really hard but not with routine exercise.  If Wesley has a lift something heavy or carry something heavy Wesley may get Davila of breath which makes sense.  Wesley has not had any recurrent symptoms of lightheadedness dizziness or syncope/near syncope.  No CHF symptoms.  Otherwise, cardiovascular symptoms are relatively normal:CV Review of Symptoms (Summary) Cardiovascular ROS: positive for - Wesley only notes some shortness of breath if Wesley does a lot of heavy exertion at work.  Not with routine activity. negative for - chest pain, edema, irregular heartbeat, orthopnea, palpitations, paroxysmal nocturnal dyspnea, rapid heart rate, shortness of breath, or lightheadedness, dizziness or wooziness, syncope/near syncope or TIA/amaurosis fugax, claudication  REVIEWED OF SYSTEMS   Review of Systems  Constitutional:  Negative for malaise/fatigue Wesley weight loss.  HENT:  Negative for congestion Wesley nosebleeds.   Respiratory:  Negative for cough Wesley shortness of breath.   Cardiovascular:        Per HPI  Gastrointestinal:  Negative for blood in stool Wesley melena.  Genitourinary:  Negative for hematuria.  Musculoskeletal:  Negative for joint pain Wesley myalgias.   Psychiatric/Behavioral: Negative.     I have reviewed Wesley (if needed) personally updated the patient's problem list, medications, allergies, past medical Wesley surgical history, social Wesley family history.   PAST MEDICAL HISTORY   Past Medical History:  Diagnosis Date   2v CAD S/P DES PCI LCx, med Rx dLAD 60%. 04/05/2019   04/01/2019: Distal LAD 60%.  Proximal mid LCx 90%-(DES PCI Resolute Onyx 3.5 x 12).  EF 55 to 60%.   Diabetes mellitus type 2 with complications (HCC)    CAD.   History of NSTEMI 04/01/2019   EF 55-60%. PCI LCx.   Hyperlipidemia     PAST SURGICAL HISTORY   Past Surgical History:  Procedure Laterality Date   CORONARY STENT INTERVENTION N/A 04/01/2019   Procedure: CORONARY STENT INTERVENTION;  Surgeon: Martinique, Peter M, MD;  Location: [facility];  Service: Cardiovascular;  p-mLCx 90% -> 0%: Resolute Onyx DES 3.5 x 12.   LEFT HEART CATH Wesley CORONARY ANGIOGRAPHY N/A 04/01/2019   Procedure: LEFT HEART CATH Wesley  CORONARY ANGIOGRAPHY;  Surgeon: Martinique, Peter M, MD;  Location: [facility];  Service: Cardiovascular; NSTEMI: Distal LAD 60%.  Proximal mid LCx 90%-(DES PCI.  EF 55 to 60%.   NM MYOVIEW LTD  09/2016   Fixed small, mild basal-mid inferior perfusion defect likely diaphragmatic attenuation. No evidence of ischemia. EF 44% with diffuse hypokinesis per computer read, but visually it would appear to be better. Considered intermediate risk.   TRANSTHORACIC ECHOCARDIOGRAM Right 04/28/2019   EF 55 to 60%.  Impaired relaxation.  No R WMA.  Mild aortic valve sclerosis with no stenosis.  Normal RV.    Immunization History  Administered Date(s) Administered   Influenza,inj,Quad PF,6+ Mos 08/10/2019   Influenza-Unspecified 07/29/2017   Pneumococcal Conjugate-13 10/11/2015   Pneumococcal Polysaccharide-23 06/25/2017   Tdap 10/11/2015    MEDICATIONS/ALLERGIES   1 current Meds  Medication Sig   aspirin 81 MG chewable tablet Chew 1 tablet (81 mg total) by  mouth daily.   glucose blood (ONETOUCH ULTRA) test strip USE ONCE DAILY AS DIRECTED   metFORMIN (GLUCOPHAGE) 1000 MG tablet Take 1 tablet (1,000 mg total) by mouth 2 (two) times daily.   metoprolol tartrate (LOPRESSOR) 50 MG tablet Take 1 tablet (50 mg total) by mouth 2 (two) times daily.   rosuvastatin (CRESTOR) 40 MG tablet Take 1 tablet (40 mg total) by mouth daily.   zolpidem (AMBIEN) 5 MG tablet Take 1 tablet (5 mg total) by mouth at bedtime as needed for sleep.    No Known Allergies  SOCIAL HISTORY/FAMILY HISTORY   Reviewed in Epic:  Pertinent findings:  Social History   Tobacco Use   Smoking status: Never   Smokeless tobacco: Current    Types: Snuff   Tobacco comments:    since 1980   Substance Use Topics   Alcohol use: No   Drug use: No   Social History   Social History Narrative   Has 6 brothers, 6 sisters - 2 Brothers with with DM & HTN        OBJCTIVE -PE, EKG, labs   Wt Readings from Last 3 Encounters:  07/05/21 195 lb 9.6 oz (88.7 kg)  10/25/20 197 lb (89.4 kg)  09/14/20 191 lb 3.2 oz (86.7 kg)    Physical Exam: BP 122/86   Pulse 90   Ht '5\' 9"'$  (1.753 m)   Wt 195 lb 9.6 oz (88.7 kg)   SpO2 98%   BMI 28.89 kg/m  Physical Exam Vitals reviewed.  Constitutional:      General: Wesley is not in acute distress.    Appearance: Normal appearance. Wesley is normal weight. Wesley is not toxic-appearing. Ill appearance: Well-nourished, well-groomed.  Appearing..    Comments: Well-nourished, well-groomed.  Healthy-appearing.  HENT:     Head: Normocephalic Wesley atraumatic.  Neck:     Vascular: No carotid bruit or JVD.  Cardiovascular:     Rate Wesley Rhythm: Normal rate Wesley regular rhythm. Occasional Extrasystoles are present.    Chest Wall: PMI is not displaced.     Pulses: Normal pulses.     Heart sounds: S1 normal Wesley S2 normal. No murmur heard.   No friction rub. No gallop.  Pulmonary:     Effort: Pulmonary effort is normal. No respiratory distress.     Breath  sounds: Normal breath sounds.     Comments: No W/R/R Musculoskeletal:        General: No swelling. Normal range of motion.     Cervical back: Normal range of motion  Wesley neck supple.  Skin:    General: Skin is warm Wesley dry.  Neurological:     General: No focal deficit present.     Mental Status: Wesley is alert Wesley oriented to person, place, Wesley time.     Motor: No weakness.     Gait: Gait normal.  Psychiatric:        Mood Wesley Affect: Mood normal.        Behavior: Behavior normal.        Thought Content: Thought content normal.        Judgment: Judgment normal.     Adult ECG Report Not checked  Recent Labs:   08/10/2019: TC 145, TG 169, HDL 55, LDL 62.  A1c 7.6. Cr 0.95, K+ 4.1.  CBC Latest Ref Rng & Units 04/08/2019 04/02/2019 04/01/2019  WBC 3.4 - 10.8 x10E3/uL 6.4 6.2 4.7  Hemoglobin 13.0 - 17.7 g/dL 13.4 13.4 12.9(L)  Hematocrit 37.5 - 51.0 % 40.1 40.4 39.8  Platelets 150 - 450 x10E3/uL 388 358 392    Lab Results  Component Value Date   HGBA1C 9.7 (H) 07/06/2021   Lab Results  Component Value Date   TSH 1.62 09/17/2016    ==================================================  COVID-19 Education: The signs Wesley symptoms of COVID-19 were discussed with the patient Wesley how to seek care for testing (follow up with PCP or arrange E-visit).    I spent a total of 27 minutes with the patient spent in direct patient consultation.  - First MD visit since MI - multiple ?s asked & answered -- cath results reviewed.  Additional time spent with chart review  / charting (studies, outside notes, etc): 15 min Total Time: 42 min  Current medicines are reviewed at length with the patient today.  (+/- concerns) n/a   This visit occurred during the SARS-CoV-2 public health emergency.  Safety protocols were in place, including screening questions prior to the visit, additional usage of staff PPE, Wesley extensive cleaning of exam room while observing appropriate contact time as indicated for  disinfecting solutions.  Notice: This dictation was prepared with Dragon dictation along with smaller phrase technology. Any transcriptional errors that result from this process are unintentional Wesley may not be corrected upon review.  Patient Instructions / Medication Changes & Studies & Tests Ordered   Patient Instructions  Medication Instructions:   No change  *If you need a refill on your cardiac medications before your next appointment, please call your pharmacy*   Lab Work: LIPID- fasting CMP hgbA1C If you have labs (blood work) drawn today Wesley your tests are completely normal, you will receive your results only by: MyChart Message (if you have MyChart) OR A paper copy in the mail If you have any lab test that is abnormal or we need to change your treatment, we will call you to review the results.   Testing/Procedures: Not needed  Follow-Up: At [facility], you Wesley your health needs are our priority.  As part of our continuing mission to provide you with exceptional heart care, we have created designated Provider Care Teams.  These Care Teams include your primary Cardiologist (physician) Wesley Advanced Practice Providers (APPs -  Physician Assistants Wesley Nurse Practitioners) who all work together to provide you with the care you need, when you need it.     Your next appointment:   6 month(s)  The format for your next appointment:   In Person  Provider:   Glenetta Hew, MD     Studies  Ordered:   Orders Placed This Encounter  Procedures   Lipid panel   Comprehensive metabolic panel   Hemoglobin A1c    Addendum: Lab Results  Component Value Date   CHOL 146 07/06/2021   HDL 48 07/06/2021   LDLCALC 66 07/06/2021   TRIG 193 (H) 07/06/2021   CHOLHDL 3.0 07/06/2021   Lab Results  Component Value Date   CREATININE 0.91 07/06/2021   BUN 13 07/06/2021   NA 136 07/06/2021   K 4.2 07/06/2021   CL 98 07/06/2021   CO2 23 07/06/2021   Lab Results   Component Value Date   HGBA1C 9.7 (H) 07/06/2021     Glenetta Hew, M.D., M.S. Interventional Cardiologist   Pager # [phone] Phone # [phone] [address] [place], [place] [address]   Thank you for choosing Heartcare at [facility]!!

## 2021-07-05 NOTE — Patient Instructions (Signed)
Medication Instructions:   No change  *If you need a refill on your cardiac medications before your next appointment, please call your pharmacy*   Lab Work: LIPID- fasting CMP hgbA1C If you have labs (blood work) drawn today and your tests are completely normal, you will receive your results only by: [place] (if you have MyChart) OR A paper copy in the mail If you have any lab test that is abnormal or we need to change your treatment, we will call you to review the results.   Testing/Procedures: Not needed  Follow-Up: At [facility], you and your health needs are our priority.  As part of our continuing mission to provide you with exceptional heart care, we have created designated Provider Care Teams.  These Care Teams include your primary Cardiologist (physician) and Advanced Practice Providers (APPs -  Physician Assistants and Nurse Practitioners) who all work together to provide you with the care you need, when you need it.     Your next appointment:   6 month(s)  The format for your next appointment:   In Person  Provider:   Glenetta Hew, MD

## 2021-07-06 DIAGNOSIS — E785 Hyperlipidemia, unspecified: Secondary | ICD-10-CM | POA: Diagnosis not present

## 2021-07-06 DIAGNOSIS — I2510 Atherosclerotic heart disease of native coronary artery without angina pectoris: Secondary | ICD-10-CM | POA: Diagnosis not present

## 2021-07-06 DIAGNOSIS — I214 Non-ST elevation (NSTEMI) myocardial infarction: Secondary | ICD-10-CM | POA: Diagnosis not present

## 2021-07-06 DIAGNOSIS — E1169 Type 2 diabetes mellitus with other specified complication: Secondary | ICD-10-CM | POA: Diagnosis not present

## 2021-07-07 LAB — LIPID PANEL
Chol/HDL Ratio: 3 ratio (ref 0.0–5.0)
Cholesterol, Total: 146 mg/dL (ref 100–199)
HDL: 48 mg/dL (ref >39)
LDL Chol Calc (NIH): 66 mg/dL (ref 0–99)
Triglycerides: 193 mg/dL — ABNORMAL HIGH (ref 0–149)
VLDL Cholesterol Cal: 32 mg/dL (ref 5–40)

## 2021-07-07 LAB — COMPREHENSIVE METABOLIC PANEL
ALT: 32 IU/L (ref 0–44)
AST: 22 IU/L (ref 0–40)
Albumin/Globulin Ratio: 1.7 (ref 1.2–2.2)
Albumin: 4.5 g/dL (ref 3.8–4.9)
Alkaline Phosphatase: 44 IU/L (ref 44–121)
BUN/Creatinine Ratio: 14 (ref 10–24)
BUN: 13 mg/dL (ref 8–27)
Bilirubin Total: 0.4 mg/dL (ref 0.0–1.2)
CO2: 23 mmol/L (ref 20–29)
Calcium: 9.3 mg/dL (ref 8.6–10.2)
Chloride: 98 mmol/L (ref 96–106)
Creatinine, Ser: 0.91 mg/dL (ref 0.76–1.27)
Globulin, Total: 2.6 g/dL (ref 1.5–4.5)
Glucose: 220 mg/dL — ABNORMAL HIGH (ref 65–99)
Potassium: 4.2 mmol/L (ref 3.5–5.2)
Sodium: 136 mmol/L (ref 134–144)
Total Protein: 7.1 g/dL (ref 6.0–8.5)
eGFR: 96 mL/min/{1.73_m2} (ref >59)

## 2021-07-07 LAB — HEMOGLOBIN A1C
Est. average glucose Bld gHb Est-mCnc: 232 mg/dL
Hgb A1c MFr Bld: 9.7 % — ABNORMAL HIGH (ref 4.8–5.6)

## 2021-07-08 ENCOUNTER — Encounter: Payer: Self-pay | Admitting: Cardiology

## 2021-07-08 NOTE — Assessment & Plan Note (Addendum)
Last available lipids and A1c were from October 2020.  LDL actually 62 and A1c was 7.6 at that time.  He remains on rosuvastatin 40 mg.    Plan: We will check FLP, CMP but also check A1c and CBC.  Pending lab results, continue current dose of rosuvastatin  He is only on metformin.  With coronary disease, would likely benefit from SGLT2 inhibitor.  If A1c not at goal, would add [place] or Jardiance.

## 2021-07-08 NOTE — Assessment & Plan Note (Signed)
Now 2 years out from his MI and PCI.  No longer on Thienopyridine.  Continue maintenance 81 mg aspirin-okay to hold Plavix 7 days preop for surgery procedures

## 2021-07-08 NOTE — Assessment & Plan Note (Addendum)
  Anything gentleman name two-vessel disease with the circumflex stented and distal LAD main treated medically.  No active anginal symptoms.  Thienopyridine was discontinued, and remains on aspirin  Plan:  He is on moderate dose of metoprolol and high-dose rosuvastatin -> with resting heart rate 90 beats minute, would like to potentially get it better controlled by increasing beta-blocker, but since he is relatively asymptomatic we will hold off for now..  Continue maintenance 81 mg aspirin-okay to hold Plavix 7 days preop for surgery procedures

## 2021-07-08 NOTE — Assessment & Plan Note (Signed)
He is now over 2 years out from his non-STEMI, and this is the First time I seen him.  He had lots of questions about pathophysiology, and prognosis.  Thankfully, he really is not having any symptoms.  Has not had any angina or heart failure symptoms since.

## 2021-07-10 NOTE — Progress Notes (Signed)
Left message to call back  

## 2021-09-12 ENCOUNTER — Other Ambulatory Visit: Payer: Self-pay | Admitting: Emergency Medicine

## 2021-09-12 DIAGNOSIS — E1169 Type 2 diabetes mellitus with other specified complication: Secondary | ICD-10-CM

## 2021-09-12 DIAGNOSIS — E1165 Type 2 diabetes mellitus with hyperglycemia: Secondary | ICD-10-CM

## 2021-12-08 ENCOUNTER — Other Ambulatory Visit: Payer: Self-pay | Admitting: Emergency Medicine

## 2021-12-08 DIAGNOSIS — E1169 Type 2 diabetes mellitus with other specified complication: Secondary | ICD-10-CM

## 2021-12-08 DIAGNOSIS — E785 Hyperlipidemia, unspecified: Secondary | ICD-10-CM

## 2021-12-09 ENCOUNTER — Other Ambulatory Visit: Payer: Self-pay | Admitting: Emergency Medicine

## 2021-12-09 DIAGNOSIS — E1165 Type 2 diabetes mellitus with hyperglycemia: Secondary | ICD-10-CM

## 2021-12-29 ENCOUNTER — Telehealth: Payer: Self-pay | Admitting: *Deleted

## 2021-12-29 NOTE — Telephone Encounter (Signed)
Spoke to  patient ,requested 01/12/22 appointment to be moved 01/08/22 at 4 pm  ?Patient in agreement ?Appointment moved to new date- 01/08/22 at 4 pm ?

## 2022-01-04 ENCOUNTER — Other Ambulatory Visit: Payer: Self-pay | Admitting: Emergency Medicine

## 2022-01-04 DIAGNOSIS — E785 Hyperlipidemia, unspecified: Secondary | ICD-10-CM

## 2022-01-04 DIAGNOSIS — E1165 Type 2 diabetes mellitus with hyperglycemia: Secondary | ICD-10-CM

## 2022-01-04 MED ORDER — ROSUVASTATIN CALCIUM 40 MG PO TABS: 40.0000 mg | ORAL_TABLET | Freq: Every day | ORAL | 3 refills | Status: DC

## 2022-01-04 MED ORDER — METFORMIN HCL 1000 MG PO TABS: 1000.0000 mg | ORAL_TABLET | Freq: Two times a day (BID) | ORAL | 3 refills | Status: AC

## 2022-01-08 ENCOUNTER — Encounter: Payer: Self-pay | Admitting: Cardiology

## 2022-01-08 ENCOUNTER — Other Ambulatory Visit: Payer: Self-pay

## 2022-01-08 ENCOUNTER — Ambulatory Visit (INDEPENDENT_AMBULATORY_CARE_PROVIDER_SITE_OTHER): Payer: 59 | Admitting: Cardiology

## 2022-01-08 VITALS — BP 130/82 | HR 81 | Ht 69.0 in | Wt 193.6 lb

## 2022-01-08 DIAGNOSIS — E785 Hyperlipidemia, unspecified: Secondary | ICD-10-CM

## 2022-01-08 DIAGNOSIS — E1169 Type 2 diabetes mellitus with other specified complication: Secondary | ICD-10-CM

## 2022-01-08 DIAGNOSIS — Z9861 Coronary angioplasty status: Secondary | ICD-10-CM

## 2022-01-08 DIAGNOSIS — I2510 Atherosclerotic heart disease of native coronary artery without angina pectoris: Secondary | ICD-10-CM

## 2022-01-08 DIAGNOSIS — I214 Non-ST elevation (NSTEMI) myocardial infarction: Secondary | ICD-10-CM

## 2022-01-08 MED ORDER — METOPROLOL TARTRATE 50 MG PO TABS
50.0000 mg | ORAL_TABLET | Freq: Two times a day (BID) | ORAL | 3 refills | Status: DC
Start: 2022-01-08 — End: 2022-07-24

## 2022-01-08 MED ORDER — ROSUVASTATIN CALCIUM 40 MG PO TABS: 40.0000 mg | ORAL_TABLET | Freq: Every day | ORAL | 3 refills | Status: DC

## 2022-01-08 NOTE — Progress Notes (Signed)
Primary Care Provider: Georgina Quint, MD Cardiologist: Bryan Lemma, MD Electrophysiologist: None  Clinic Note: Chief Complaint  Patient presents with   Follow-up    6 months.  Doing well   Coronary Artery Disease    No angina    ===================================  ASSESSMENT/PLAN   Problem List Items Addressed This Visit       Cardiology Problems   History of NSTEMI (Chronic)    Almost 3 years out from MI.  No further chest pain or pressure with rest or exertion.  Just some mild atypical symptoms. No CHF symptoms.      Relevant Medications   metoprolol tartrate (LOPRESSOR) 50 MG tablet   rosuvastatin (CRESTOR) 40 MG tablet   Other Relevant Orders   EKG 12-Lead (Completed)   2v CAD S/P DES PCI LCx, med Rx dLAD 60%. (Chronic)    Almost 3 years out from MI and PCI.  No longer on Thienopyridine.  On maintenance dose aspirin 81 mg daily.  Okay to hold aspirin 5-7 days preop for surgeries.      Relevant Medications   metoprolol tartrate (LOPRESSOR) 50 MG tablet   rosuvastatin (CRESTOR) 40 MG tablet   Other Relevant Orders   EKG 12-Lead (Completed)   Hyperlipidemia associated with type 2 diabetes mellitus (HCC) (Chronic)    Lipids checked in September showed LDL 66.  Well-controlled now.  Triglycerides a little high which goes along with his A1c being 9.7.  He is only on metformin for diabetes.  I suggest that he may benefit from additional therapy such as sulfonylurea, SGLT2 inhibitor or GLP-1 agonist.      Relevant Medications   metoprolol tartrate (LOPRESSOR) 50 MG tablet   rosuvastatin (CRESTOR) 40 MG tablet   Coronary artery disease involving native coronary artery without angina pectoris - Primary (Chronic)    No further angina since his PCI back in 2020.  He just has/atypical chest twinges that does not look to be cardiac in nature.  Plan: Continue to stay active try to get routine exercise. On aspirin monotherapy-okay to hold 5 days preop for  surgeries or procedures. On stable dose of beta-blocker and statin.  Doing well.  Tolerating well.       Relevant Medications   metoprolol tartrate (LOPRESSOR) 50 MG tablet   rosuvastatin (CRESTOR) 40 MG tablet   Other Relevant Orders   EKG 12-Lead (Completed)    ===================================  HPI:    Wesley Davila is a 61 y.o. male with a CV history noted below who presents today for 24-month follow-up.  December 2017: Evaluated for chest pain w/ Myoview-EF 44%, fixed small midodrine basal and mid inferior defect suggestive of diaphragmatic attenuation.  Echo EF 55 to 60%. June 2020: Non-STEMI-DES PCI P-M LCx (Resolute Onyx 3.5 mm x 12 mm); echo EF 55 to 60%.  No RWMA. LDL at that time was 156, Crestor dose increased to 40 mg.  Wesley Davila was last seen in September 2022 in follow-up, monitor and evaluate palpitations.  Doing well since MRI.  Off-and-on skipped beats but nothing prolonged.  Only mild exertional dyspnea exertion.  Usually carries something heavy.  Recent Hospitalizations: None  Reviewed  CV studies:    The following studies were reviewed today: (if available, images/films reviewed: From Epic Chart or Care Everywhere) No new:   Interval History:   Wesley Davila returns here today for 79-month follow-up doing pretty well.  He says he occasionally has chest pain that may happen if he overdoes it, but  can also just happen randomly with no activity.  More in the right side of the chest.  He says occasionally his heart rate will go fast, but usually when he is active or has just been active.  He is still quite active but does not do routine exercise.  Essentially he is stable minimal symptoms from prior standpoint.  Only mild exertional dyspnea and fatigue simply for me out of shape. CV Review of Symptoms (Summary) Cardiovascular ROS: positive for - chest pain, dyspnea on exertion, palpitations, and this is really only with overexertion.  Also notes  off-and-on atypical musculoskeletal type chest pain. negative for - chest pain, edema, orthopnea, paroxysmal nocturnal dyspnea, rapid heart rate, shortness of breath, or lightheadedness, dizziness or wooziness, syncope/near syncope or TIA/amaurosis fugax, claudication.  REVIEWED OF SYSTEMS   Review of Systems  Constitutional:  Negative for malaise/fatigue and weight loss.  HENT:  Negative for nosebleeds.   Respiratory:  Negative for cough and shortness of breath.   Cardiovascular:  Negative for claudication and leg swelling.       Per HPI  Gastrointestinal:  Negative for blood in stool and melena.  Musculoskeletal:  Negative for falls and joint pain.  Neurological:  Positive for dizziness (Off-and-on sometimes he wakes up in the morning). Negative for focal weakness and weakness.  Psychiatric/Behavioral:  Negative for depression and memory loss. The patient is not nervous/anxious and does not have insomnia.    I have reviewed and (if needed) personally updated the patient's problem list, medications, allergies, past medical and surgical history, social and family history.   PAST MEDICAL HISTORY   Past Medical History:  Diagnosis Date   2v CAD S/P DES PCI LCx, med Rx dLAD 60%. 04/05/2019   04/01/2019: Distal LAD 60%.  Proximal mid LCx 90%-(DES PCI Resolute Onyx 3.5 x 12).  EF 55 to 60%.   Diabetes mellitus type 2 with complications (HCC)    CAD.   History of NSTEMI 04/01/2019   EF 55-60%. PCI LCx.   Hyperlipidemia    patient-activated cardiac event monitor 10/2020   Essentially normal.  No arrhythmias.  Rare PACs and PVCs.  15 patient triggered events.  Mostly associated with sinus rhythm-sinus tachycardia and PVCs.    PAST SURGICAL HISTORY   Past Surgical History:  Procedure Laterality Date   CORONARY STENT INTERVENTION N/A 04/01/2019   Procedure: CORONARY STENT INTERVENTION;  Surgeon: Swaziland, Peter M, MD;  Location: [facility] INVASIVE CV LAB;  Service: Cardiovascular;  p-mLCx 90% -> 0%:  Resolute Onyx DES 3.5 x 12.   LEFT HEART CATH AND CORONARY ANGIOGRAPHY N/A 04/01/2019   Procedure: LEFT HEART CATH AND CORONARY ANGIOGRAPHY;  Surgeon: Swaziland, Peter M, MD;  Location: [facility] INVASIVE CV LAB;  Service: Cardiovascular; NSTEMI: Distal LAD 60%.  Proximal mid LCx 90%-(DES PCI.  EF 55 to 60%.   NM MYOVIEW LTD  09/2016   Fixed small, mild basal-mid inferior perfusion defect likely diaphragmatic attenuation. No evidence of ischemia. EF 44% with diffuse hypokinesis per computer read, but visually it would appear to be better. Considered intermediate risk.   TRANSTHORACIC ECHOCARDIOGRAM Right 04/28/2019   EF 55 to 60%.  Impaired relaxation.  No R WMA.  Mild aortic valve sclerosis with no stenosis.  Normal RV.    Immunization History  Administered Date(s) Administered   Influenza,inj,Quad PF,6+ Mos 08/10/2019   Influenza-Unspecified 07/29/2017   Pneumococcal Conjugate-13 10/11/2015   Pneumococcal Polysaccharide-23 06/25/2017   Tdap 10/11/2015    MEDICATIONS/ALLERGIES   No outpatient medications have been  marked as taking for the 01/08/22 encounter (Office Visit) with Marykay Lex, MD.    No Known Allergies  SOCIAL HISTORY/FAMILY HISTORY   Reviewed in Epic:  Pertinent findings:  Social History   Tobacco Use   Smoking status: Never   Smokeless tobacco: Current    Types: Snuff   Tobacco comments:    since 1980   Substance Use Topics   Alcohol use: No   Drug use: No   Social History   Social History Narrative   Has 6 brothers, 6 sisters - 2 Brothers with with DM & HTN        OBJCTIVE -PE, EKG, labs   Wt Readings from Last 3 Encounters:  01/08/22 193 lb 9.6 oz (87.8 kg)  07/05/21 195 lb 9.6 oz (88.7 kg)  10/25/20 197 lb (89.4 kg)    Physical Exam: BP 130/82   Pulse 81   Ht 5\' 9"  (1.753 m)   Wt 193 lb 9.6 oz (87.8 kg)   SpO2 97%   BMI 28.59 kg/m  Physical Exam Vitals reviewed.  Constitutional:      General: He is not in acute distress.    Appearance:  Normal appearance. He is normal weight. He is not ill-appearing or toxic-appearing.  HENT:     Head: Normocephalic and atraumatic.  Neck:     Vascular: No carotid bruit, hepatojugular reflux or JVD.  Cardiovascular:     Rate and Rhythm: Normal rate and regular rhythm.     Pulses: Normal pulses.     Heart sounds: Normal heart sounds. No murmur heard.   No friction rub. No gallop.  Pulmonary:     Effort: Pulmonary effort is normal. No respiratory distress.     Breath sounds: Normal breath sounds. No wheezing, rhonchi or rales.  Chest:     Chest wall: No tenderness.  Musculoskeletal:        General: No swelling. Normal range of motion.     Cervical back: Normal range of motion and neck supple.  Skin:    General: Skin is warm and dry.     Coloration: Skin is not pale.  Neurological:     General: No focal deficit present.     Mental Status: He is alert and oriented to person, place, and time.     Gait: Gait normal.  Psychiatric:        Mood and Affect: Mood normal.        Behavior: Behavior normal.        Thought Content: Thought content normal.        Judgment: Judgment normal.     Adult ECG Report  Rate: 81;  Rhythm: normal sinus rhythm and nonspecific ST and T wave changes.  Otherwise normal axis, intervals and durations. ;   Narrative Interpretation: Stable  Recent Labs: Reviewed Lab Results  Component Value Date   CHOL 146 07/06/2021   HDL 48 07/06/2021   LDLCALC 66 07/06/2021   TRIG 193 (H) 07/06/2021   CHOLHDL 3.0 07/06/2021   Lab Results  Component Value Date   CREATININE 0.91 07/06/2021   BUN 13 07/06/2021   NA 136 07/06/2021   K 4.2 07/06/2021   CL 98 07/06/2021   CO2 23 07/06/2021   CBC Latest Ref Rng & Units 04/08/2019 04/02/2019 04/01/2019  WBC 3.4 - 10.8 x10E3/uL 6.4 6.2 4.7  Hemoglobin 13.0 - 17.7 g/dL 16.1 09.6 12.9(L)  Hematocrit 37.5 - 51.0 % 40.1 40.4 39.8  Platelets 150 - 450 x10E3/uL 388  358 392    Lab Results  Component Value Date   HGBA1C  9.7 (H) 07/06/2021   Lab Results  Component Value Date   TSH 1.62 09/17/2016    ==================================================  COVID-19 Education: The signs and symptoms of COVID-19 were discussed with the patient and how to seek care for testing (follow up with PCP or arrange E-visit).    I spent a total of 23 minutes with the patient spent in direct patient consultation.  Additional time spent with chart review  / charting (studies, outside notes, etc): 14 min Total Time: 37 min  Current medicines are reviewed at length with the patient today.  (+/- concerns) none  This visit occurred during the SARS-CoV-2 public health emergency.  Safety protocols were in place, including screening questions prior to the visit, additional usage of staff PPE, and extensive cleaning of exam room while observing appropriate contact time as indicated for disinfecting solutions.  Notice: This dictation was prepared with Dragon dictation along with smart phrase technology. Any transcriptional errors that result from this process are unintentional and may not be corrected upon review.  Studies Ordered:   Orders Placed This Encounter  Procedures   EKG 12-Lead    Patient Instructions / Medication Changes & Studies & Tests Ordered   Patient Instructions  Medication Instructions:    No changes *If you need a refill on your cardiac medications before your next appointment, please call your pharmacy*   Lab Work:  Not needed   Testing/Procedures:  Not needed  Follow-Up: At [facility], you and your health needs are our priority.  As part of our continuing mission to provide you with exceptional heart care, we have created designated Provider Care Teams.  These Care Teams include your primary Cardiologist (physician) and Advanced Practice Providers (APPs -  Physician Assistants and Nurse Practitioners) who all work together to provide you with the care you need, when you need it.      Your next appointment:   12 month(s)  The format for your next appointment:   In Person  Provider:   Bryan Lemma, MD         Bryan Lemma, M.D., M.S. Interventional Cardiologist   Pager # [phone] Phone # [phone] [address] [place], [place] [address]   Thank you for choosing Heartcare at [facility]!!

## 2022-01-08 NOTE — Patient Instructions (Addendum)

## 2022-01-09 ENCOUNTER — Encounter: Payer: Self-pay | Admitting: Cardiology

## 2022-01-09 NOTE — Assessment & Plan Note (Signed)
Almost 3 years out from MI and PCI.  No longer on Thienopyridine. ? ?? On maintenance dose aspirin 81 mg daily.  Okay to hold aspirin 5-7 days preop for surgeries. ?

## 2022-01-09 NOTE — Assessment & Plan Note (Signed)
Almost 3 years out from MI.  No further chest pain or pressure with rest or exertion.  Just some mild atypical symptoms. ?No CHF symptoms. ?

## 2022-01-09 NOTE — Assessment & Plan Note (Signed)
Lipids checked in September showed LDL 66.  Well-controlled now.  Triglycerides a little high which goes along with his A1c being 9.7. ? ?He is only on metformin for diabetes.  I suggest that he may benefit from additional therapy such as sulfonylurea, SGLT2 inhibitor or GLP-1 agonist. ?

## 2022-01-09 NOTE — Assessment & Plan Note (Signed)
No further angina since his PCI back in 2020.  He just has/atypical chest twinges that does not look to be cardiac in nature. ? ?Plan: Continue to stay active try to get routine exercise. ?? On aspirin monotherapy-okay to hold 5 days preop for surgeries or procedures. ?? On stable dose of beta-blocker and statin.  Doing well.  Tolerating well. ? ?

## 2022-01-12 ENCOUNTER — Ambulatory Visit: Payer: BC Managed Care – PPO | Admitting: Cardiology

## 2022-07-20 ENCOUNTER — Telehealth: Payer: Self-pay | Admitting: Cardiology

## 2022-07-20 NOTE — Telephone Encounter (Signed)
Patient states for the past week he has been experiencing weakness--would like a call back to discuss.

## 2022-07-20 NOTE — Telephone Encounter (Signed)
Returned call to patient who states that he has been having shortness of breath and weakness since the last week. Patient reports he does not have shortness of breath with activity but reports that he is short of breath when lying flat on his back. Patient reports that he is also weak and fatigued as well. Patient denies any dizziness, lightheadedness, palpitations, swelling, weight gain or chest pain/pressure. Patient unable to provide BP readings or HR readings. Scheduled patient to see Almyra Deforest PA-C on Tuesday 9/26 for a follow up to evaluate. Made patient aware of ED precautions should new or worsening symptoms develop. Patient verbalized understanding.   Will forward to MD to make aware.

## 2022-07-24 ENCOUNTER — Ambulatory Visit: Payer: Commercial Managed Care - HMO | Attending: Physician Assistant | Admitting: Physician Assistant

## 2022-07-24 ENCOUNTER — Encounter: Payer: Self-pay | Admitting: Physician Assistant

## 2022-07-24 ENCOUNTER — Other Ambulatory Visit: Payer: Self-pay | Admitting: Physician Assistant

## 2022-07-24 VITALS — BP 138/82 | HR 98 | Ht 69.0 in | Wt 188.0 lb

## 2022-07-24 DIAGNOSIS — E785 Hyperlipidemia, unspecified: Secondary | ICD-10-CM | POA: Diagnosis not present

## 2022-07-24 DIAGNOSIS — I25119 Atherosclerotic heart disease of native coronary artery with unspecified angina pectoris: Secondary | ICD-10-CM

## 2022-07-24 DIAGNOSIS — E119 Type 2 diabetes mellitus without complications: Secondary | ICD-10-CM | POA: Diagnosis not present

## 2022-07-24 DIAGNOSIS — R079 Chest pain, unspecified: Secondary | ICD-10-CM | POA: Diagnosis not present

## 2022-07-24 LAB — SPECIMEN STATUS REPORT

## 2022-07-24 MED ORDER — METOPROLOL TARTRATE 50 MG PO TABS: 75.0000 mg | ORAL_TABLET | Freq: Two times a day (BID) | ORAL | 3 refills | Status: DC

## 2022-07-24 MED ORDER — NITROGLYCERIN 0.4 MG SL SUBL: 0.4000 mg | SUBLINGUAL_TABLET | SUBLINGUAL | 3 refills | Status: AC | PRN

## 2022-07-24 NOTE — Patient Instructions (Addendum)
Medication Instructions:  Your physician has recommended you make the following change in your medication:  INCREASE: Metoprolol '75mg'$  BID(1.5 tablets twice daily)  START: Nitroglycerin 0.4 mg place 1 tablet under your tongue every 5 minutes (up to three times) as needed for chest pain.  *If you need a refill on your cardiac medications before your next appointment, please call your pharmacy*   Lab Work: Your physician recommends that you having the following labs drawn today: CBC, CMP, and D-Dimer If you have labs (blood work) drawn today and your tests are completely normal, you will receive your results only by: [place] (if you have MyChart) OR A paper copy in the mail If you have any lab test that is abnormal or we need to change your treatment, we will call you to review the results.   Testing/Procedures: Your physician has requested that you have an echocardiogram. Echocardiography is a painless test that uses sound waves to create images of your heart. It provides your doctor with information about the size and shape of your heart and how well your heart's chambers and valves are working. This procedure takes approximately one hour. There are no restrictions for this procedure.   Your physician has requested that you have a lexiscan myoview. For further information please visit [email or web address]. Please follow instruction sheet, as given.    Follow-Up: At [facility], you and your health needs are our priority.  As part of our continuing mission to provide you with exceptional heart care, we have created designated Provider Care Teams.  These Care Teams include your primary Cardiologist (physician) and Advanced Practice Providers (APPs -  Physician Assistants and Nurse Practitioners) who all work together to provide you with the care you need, when you need it.  We recommend signing up for the patient portal called "MyChart".  Sign up information is provided on this  After Visit Summary.  MyChart is used to connect with patients for Virtual Visits (Telemedicine).  Patients are able to view lab/test results, encounter notes, upcoming appointments, etc.  Non-urgent messages can be sent to your provider as well.   To learn more about what you can do with MyChart, go to [email or web address].    Your next appointment:   4 week(s)  The format for your next appointment:   In Person  Provider:   Almyra Deforest, PA-C       Other Instructions  The test will take approximately 3 to 4 hours to complete; you may bring reading material.  If someone comes with you to your appointment, they will need to remain in the main lobby due to limited space in the testing area.  How to prepare for your Myocardial Perfusion Test: Do not eat or drink 3 hours prior to your test, except you may have water. Do not consume products containing caffeine (regular or decaffeinated) 12 hours prior to your test. (ex: coffee, chocolate, sodas, tea). Do bring a list of your current medications with you.  If not listed below, you may take your medications as normal. Do wear comfortable clothes (no dresses or overalls) and walking shoes, tennis shoes preferred (No heels or open toe shoes are allowed). Do NOT wear cologne, perfume, aftershave, or lotions (deodorant is allowed). If these instructions are not followed, your test will have to be rescheduled.   Important Information About Sugar

## 2022-07-24 NOTE — Progress Notes (Unsigned)
Cardiology Office Note:    Date:  07/26/2022   ID:  Wesley Davila, DOB 11/04/1960, MRN [id number]  PCP:  Horald Pollen, [facility] Cardiologist:  Glenetta Hew, MD     Referring MD: Horald Pollen, *   Chief Complaint  Patient presents with   Follow-up    Seen for Dr. Ellyn Hack    History of Present Illness:    Wesley Davila is a 61 y.o. male with a hx of hyperlipidemia, DM2 and CAD.  Patient was established with cardiology service in December 2017 due to chest discomfort.  He was referred for a Myoview test which came back showing EF 44%, fixed small mild basal to mid inferior perfusion defect possible diaphragmatic attenuation, no evidence of ischemia.  Subsequent echocardiogram obtained in February 2018 showed EF 55 to 60%, no significant valve issue.  Patient unfortunately was admitted with NSTEMI in June 2020.  Echocardiogram performed on 04/01/2019 showed EF 55 to 60%, no significant wall motion abnormality, mild to moderate AI.  Subsequent cardiac catheterization performed on 04/01/2019 showed single-vessel CAD, 60% distal LAD lesion, 90% proximal to mid left circumflex lesion treated with a Resolute Onyx 3.5 x 12 mm DES, no significant disease in left circumflex or RCA, EF 55 to 65% by visual estimate.  Crestor was increased to 40 mg daily.  LDL was 156 during hospitalization, total cholesterol 272, triglyceride 201.   I last saw the patient on December 2021 for evaluation of presyncope, a heart monitor was obtained in January 2022 which came back essentially normal with rare PAC and PVCs.  Most of the patient triggered events were associated with sinus rhythm.  Patient was last seen by Dr. Ellyn Hack in March 2023 at which time he was doing well without significant chest discomfort.  Patient presents today for evaluation of chest discomfort and shortness of breath.  He does not notice the symptoms during the day when he works, however mainly  noticed the symptom when he come home and try to lay down at night.  He denies any PND.  He has no lower extremity edema.  He denies any exacerbating factors such as deep inspiration, body rotation or palpation.  I will give him a sublingual nitroglycerin.  I will increase metoprolol to tartrate to 75 mg twice a day.  I recommended CBC, CMP, D-dimer, will obtain echocardiogram and a stress test.  I plan to see him back in 4 weeks.  Note patient is traveling in November to [place] to visit [place].  If echocardiogram and stress test are normal, he can potentially go, however if the symptom worsens or if the test came back abnormal, he may need to cancel his trip.   Past Medical History:  Diagnosis Date   2v CAD S/P DES PCI LCx, med Rx dLAD 60%. 04/05/2019   04/01/2019: Distal LAD 60%.  Proximal mid LCx 90%-(DES PCI Resolute Onyx 3.5 x 12).  EF 55 to 60%.   Diabetes mellitus type 2 with complications (HCC)    CAD.   History of NSTEMI 04/01/2019   EF 55-60%. PCI LCx.   Hyperlipidemia    patient-activated cardiac event monitor 10/2020   Essentially normal.  No arrhythmias.  Rare PACs and PVCs.  15 patient triggered events.  Mostly associated with sinus rhythm-sinus tachycardia and PVCs.    Past Surgical History:  Procedure Laterality Date   CORONARY STENT INTERVENTION N/A 04/01/2019   Procedure: CORONARY STENT INTERVENTION;  Surgeon: Martinique, Peter  M, MD;  Location: [place] [facility];  Service: Cardiovascular;  p-mLCx 90% -> 0%: Resolute Onyx DES 3.5 x 12.   LEFT HEART CATH AND CORONARY ANGIOGRAPHY N/A 04/01/2019   Procedure: LEFT HEART CATH AND CORONARY ANGIOGRAPHY;  Surgeon: Martinique, Peter M, MD;  Location: [place] [facility];  Service: Cardiovascular; NSTEMI: Distal LAD 60%.  Proximal mid LCx 90%-(DES PCI.  EF 55 to 60%.   NM MYOVIEW LTD  09/2016   Fixed small, mild basal-mid inferior perfusion defect likely diaphragmatic attenuation. No evidence of ischemia. EF 44% with diffuse hypokinesis  per computer read, but visually it would appear to be better. Considered intermediate risk.   TRANSTHORACIC ECHOCARDIOGRAM Right 04/28/2019   EF 55 to 60%.  Impaired relaxation.  No R WMA.  Mild aortic valve sclerosis with no stenosis.  Normal RV.    Current Medications: Current Meds  Medication Sig   aspirin 81 MG chewable tablet Chew 1 tablet (81 mg total) by mouth daily.   glucose blood (ONETOUCH ULTRA) test strip USE ONCE DAILY AS DIRECTED   metFORMIN (GLUCOPHAGE) 1000 MG tablet Take 1 tablet (1,000 mg total) by mouth 2 (two) times daily with a meal.   nitroGLYCERIN (NITROSTAT) 0.4 MG SL tablet Place 1 tablet (0.4 mg total) under the tongue every 5 (five) minutes as needed for chest pain.   rosuvastatin (CRESTOR) 40 MG tablet Take 1 tablet (40 mg total) by mouth daily.   [DISCONTINUED] metoprolol tartrate (LOPRESSOR) 50 MG tablet Take 1 tablet (50 mg total) by mouth 2 (two) times daily.     Allergies:   Patient has no known allergies.   Social History   Socioeconomic History   Marital status: Married    Spouse name: Not on file   Number of children: 3   Years of education: Not on file   Highest education level: Not on file  Occupational History   Occupation: Agricultural consultant    Comment: Secondary school teacher  Tobacco Use   Smoking status: Never   Smokeless tobacco: Current    Types: Snuff   Tobacco comments:    since 1980   Substance and Sexual Activity   Alcohol use: No   Drug use: No   Sexual activity: Not on file  Other Topics Concern   Not on file  Social History Narrative   Has 6 brothers, 6 sisters - 2 Brothers with with DM & HTN       Social Determinants of Health   Financial Resource Strain: Not on file  Food Insecurity: Not on file  Transportation Needs: Not on file  Physical Activity: Not on file  Stress: Not on file  Social Connections: Not on file     Family History: The patient's family history includes Breast cancer in his sister; Diabetes in his brother  and brother; Heart disease (age of onset: 68) in his father; Hypertension in his brother and brother; Stroke in his father. There is no history of Colon cancer.  ROS:   Please see the history of present illness.     All other systems reviewed and are negative.  EKGs/Labs/Other Studies Reviewed:    The following studies were reviewed today:  Echo 04/01/2019  1. The left ventricle has normal systolic function, with an ejection  fraction of 55-60%. The cavity size was normal. Left ventricular diastolic  Doppler parameters are consistent with impaired relaxation. No evidence of  left ventricular regional wall  motion abnormalities.   2. The right ventricle has normal systolic  function. The cavity was  normal. There is no increase in right ventricular wall thickness. Right  ventricular systolic pressure could not be assessed.   3. The aortic valve is tricuspid. Mild sclerosis of the aortic valve.  Aortic valve regurgitation is mild to moderate by color flow Doppler.    Cath 04/01/2019 Dist LAD lesion is 60% stenosed. Prox Cx to Mid Cx lesion is 90% stenosed. Post intervention, there is a 0% residual stenosis. A drug-eluting stent was successfully placed using a STENT RESOLUTE ONYX 3.5X12. The left ventricular systolic function is normal. LV end diastolic pressure is normal. The left ventricular ejection fraction is 55-65% by visual estimate.   1. Single vessel obstructive CAD 2. Normal LV function 3. Normal LV filling pressure 4. Successful PCI of the mid LCx with DES x 1   Plan: DAPT for one year. Risk factor modification. Anticipate DC tomorrow.   EKG:  EKG is ordered today.  The ekg ordered today demonstrates normal sinus rhythm, heart rate borderline high at 98 bpm.  Frequent PVCs.  Recent Labs: 07/24/2022: ALT 15; BUN 9; Creatinine, Ser 0.83; Hemoglobin 14.5; Platelets 318; Potassium 4.9; Sodium 133  Recent Lipid Panel    Component Value Date/Time   CHOL 146 07/06/2021 1417    TRIG 193 (H) 07/06/2021 1417   HDL 48 07/06/2021 1417   CHOLHDL 3.0 07/06/2021 1417   CHOLHDL 5.0 04/01/2019 0318   VLDL 60 (H) 04/01/2019 0318   LDLCALC 66 07/06/2021 1417     Risk Assessment/Calculations:           Physical Exam:    VS:  BP 138/82   Pulse 98   Ht '5\' 9"'$  (1.753 m)   Wt 188 lb (85.3 kg)   SpO2 98%   BMI 27.76 kg/m        Wt Readings from Last 3 Encounters:  07/24/22 188 lb (85.3 kg)  01/08/22 193 lb 9.6 oz (87.8 kg)  07/05/21 195 lb 9.6 oz (88.7 kg)     GEN:  Well nourished, well developed in no acute distress HEENT: Normal NECK: No JVD; No carotid bruits LYMPHATICS: No lymphadenopathy CARDIAC: RRR, no murmurs, rubs, gallops RESPIRATORY:  Clear to auscultation without rales, wheezing or rhonchi  ABDOMEN: Soft, non-tender, non-distended MUSCULOSKELETAL:  No edema; No deformity  SKIN: Warm and dry NEUROLOGIC:  Alert and oriented x 3 PSYCHIATRIC:  Normal affect   ASSESSMENT:    1. Chest pain, unspecified type   2. Coronary artery disease involving native coronary artery of native heart with angina pectoris ([place])   3. Hyperlipidemia LDL goal <70   4. Controlled type 2 diabetes mellitus without complication, without long-term current use of insulin (HCC)    PLAN:    In order of problems listed above:  Chest pain: Obtain blood work, echocardiogram and stress test.  Blood work including CBC, CMP, and D-dimer.  We will obtain D-dimer mainly because the patient is not having any chest pain with exertion during work but more so when he is laying down at night.  CAD: See #1.  On aspirin, will increase metoprolol further to 75 mg twice a day for antianginal purposes.  Hyperlipidemia: On Crestor  DM2: On metformin.      Shared Decision Making/Informed Consent The risks [chest pain, shortness of breath, cardiac arrhythmias, dizziness, blood pressure fluctuations, myocardial infarction, stroke/transient ischemic attack, nausea, vomiting, allergic  reaction, radiation exposure, metallic taste sensation and life-threatening complications (estimated to be 1 in 10,000)], benefits (risk stratification, diagnosing coronary  artery disease, treatment guidance) and alternatives of a nuclear stress test were discussed in detail with Wesley Davila and he agrees to proceed.    Medication Adjustments/Labs and Tests Ordered: Current medicines are reviewed at length with the patient today.  Concerns regarding medicines are outlined above.  Orders Placed This Encounter  Procedures   CBC   Comprehensive Metabolic Panel (CMET)   D-Dimer, Quantitative   Specimen status report   Cardiac Stress Test: Informed Consent Details: Physician/Practitioner Attestation; Transcribe to consent form and obtain patient signature   MYOCARDIAL PERFUSION IMAGING   EKG 12-Lead   ECHOCARDIOGRAM COMPLETE   Meds ordered this encounter  Medications   metoprolol tartrate (LOPRESSOR) 50 MG tablet    Sig: Take 1.5 tablets (75 mg total) by mouth 2 (two) times daily.    Dispense:  270 tablet    Refill:  3   nitroGLYCERIN (NITROSTAT) 0.4 MG SL tablet    Sig: Place 1 tablet (0.4 mg total) under the tongue every 5 (five) minutes as needed for chest pain.    Dispense:  25 tablet    Refill:  3    Patient Instructions  Medication Instructions:  Your physician has recommended you make the following change in your medication:  INCREASE: Metoprolol '75mg'$  BID(1.5 tablets twice daily)  START: Nitroglycerin 0.4 mg place 1 tablet under your tongue every 5 minutes (up to three times) as needed for chest pain.  *If you need a refill on your cardiac medications before your next appointment, please call your pharmacy*   Lab Work: Your physician recommends that you having the following labs drawn today: CBC, CMP, and D-Dimer If you have labs (blood work) drawn today and your tests are completely normal, you will receive your results only by: [place] (if you have MyChart) OR A  paper copy in the mail If you have any lab test that is abnormal or we need to change your treatment, we will call you to review the results.   Testing/Procedures: Your physician has requested that you have an echocardiogram. Echocardiography is a painless test that uses sound waves to create images of your heart. It provides your doctor with information about the size and shape of your heart and how well your heart's chambers and valves are working. This procedure takes approximately one hour. There are no restrictions for this procedure.   Your physician has requested that you have a lexiscan myoview. For further information please visit [email or web address]. Please follow instruction sheet, as given.    Follow-Up: At [facility], you and your health needs are our priority.  As part of our continuing mission to provide you with exceptional heart care, we have created designated Provider Care Teams.  These Care Teams include your primary Cardiologist (physician) and Advanced Practice Providers (APPs -  Physician Assistants and Nurse Practitioners) who all work together to provide you with the care you need, when you need it.  We recommend signing up for the patient portal called "MyChart".  Sign up information is provided on this After Visit Summary.  MyChart is used to connect with patients for Virtual Visits (Telemedicine).  Patients are able to view lab/test results, encounter notes, upcoming appointments, etc.  Non-urgent messages can be sent to your provider as well.   To learn more about what you can do with MyChart, go to [email or web address].    Your next appointment:   4 week(s)  The format for your next appointment:   In Person  Provider:  Almyra Deforest, PA-C       Other Instructions  The test will take approximately 3 to 4 hours to complete; you may bring reading material.  If someone comes with you to your appointment, they will need to remain in the main lobby due to  limited space in the testing area.  How to prepare for your Myocardial Perfusion Test: Do not eat or drink 3 hours prior to your test, except you may have water. Do not consume products containing caffeine (regular or decaffeinated) 12 hours prior to your test. (ex: coffee, chocolate, sodas, tea). Do bring a list of your current medications with you.  If not listed below, you may take your medications as normal. Do wear comfortable clothes (no dresses or overalls) and walking shoes, tennis shoes preferred (No heels or open toe shoes are allowed). Do NOT wear cologne, perfume, aftershave, or lotions (deodorant is allowed). If these instructions are not followed, your test will have to be rescheduled.   Important Information About Sugar         Hilbert Corrigan, [place]  07/26/2022 10:45 PM    [facility]

## 2022-07-25 LAB — D-DIMER, QUANTITATIVE: D-DIMER: 0.2 mg/L FEU (ref 0.00–0.49)

## 2022-07-26 ENCOUNTER — Encounter: Payer: Self-pay | Admitting: Physician Assistant

## 2022-08-02 LAB — D-DIMER, QUANTITATIVE

## 2022-08-02 LAB — CBC
Hematocrit: 43.4 % (ref 37.5–51.0)
Hemoglobin: 14.5 g/dL (ref 13.0–17.7)
MCH: 29.1 pg (ref 26.6–33.0)
MCHC: 33.4 g/dL (ref 31.5–35.7)
MCV: 87 fL (ref 79–97)
Platelets: 318 10*3/uL (ref 150–450)
RBC: 4.99 x10E6/uL (ref 4.14–5.80)
RDW: 11.8 % (ref 11.6–15.4)
WBC: 3.7 10*3/uL (ref 3.4–10.8)

## 2022-08-02 LAB — COMPREHENSIVE METABOLIC PANEL
ALT: 15 IU/L (ref 0–44)
AST: 14 IU/L (ref 0–40)
Albumin/Globulin Ratio: 1.6 (ref 1.2–2.2)
Albumin: 4.4 g/dL (ref 3.9–4.9)
Alkaline Phosphatase: 57 IU/L (ref 44–121)
BUN/Creatinine Ratio: 11 (ref 10–24)
BUN: 9 mg/dL (ref 8–27)
Bilirubin Total: 0.3 mg/dL (ref 0.0–1.2)
CO2: 26 mmol/L (ref 20–29)
Calcium: 9.4 mg/dL (ref 8.6–10.2)
Chloride: 95 mmol/L — ABNORMAL LOW (ref 96–106)
Creatinine, Ser: 0.83 mg/dL (ref 0.76–1.27)
Globulin, Total: 2.7 g/dL (ref 1.5–4.5)
Glucose: 258 mg/dL — ABNORMAL HIGH (ref 70–99)
Potassium: 4.9 mmol/L (ref 3.5–5.2)
Sodium: 133 mmol/L — ABNORMAL LOW (ref 134–144)
Total Protein: 7.1 g/dL (ref 6.0–8.5)
eGFR: 100 mL/min/{1.73_m2} (ref >59)

## 2022-08-07 ENCOUNTER — Telehealth (HOSPITAL_COMMUNITY): Payer: Self-pay | Admitting: *Deleted

## 2022-08-07 NOTE — Telephone Encounter (Signed)
Patient given detailed instructions per Myocardial Perfusion Study Information Sheet for the test on 08/09/2022 at 8:00. Patient notified to arrive 15 minutes early and that it is imperative to arrive on time for appointment to keep from having the test rescheduled.  If you need to cancel or reschedule your appointment, please call the office within 24 hours of your appointment. . Patient verbalized understanding.Wesley Davila

## 2022-08-09 ENCOUNTER — Ambulatory Visit (HOSPITAL_BASED_OUTPATIENT_CLINIC_OR_DEPARTMENT_OTHER): Payer: Commercial Managed Care - HMO

## 2022-08-09 ENCOUNTER — Ambulatory Visit (HOSPITAL_COMMUNITY): Payer: Commercial Managed Care - HMO | Attending: Physician Assistant

## 2022-08-09 DIAGNOSIS — R079 Chest pain, unspecified: Secondary | ICD-10-CM | POA: Diagnosis present

## 2022-08-09 LAB — MYOCARDIAL PERFUSION IMAGING
LV dias vol: 113 mL (ref 62–150)
LV sys vol: 72 mL
Nuc Stress EF: 36 %
Peak HR: 100 {beats}/min
Rest HR: 74 {beats}/min
Rest Nuclear Isotope Dose: 10.7 mCi
SDS: 1
SRS: 3
SSS: 4
ST Depression (mm): 0 mm
Stress Nuclear Isotope Dose: 32.9 mCi
TID: 1.02

## 2022-08-09 LAB — ECHOCARDIOGRAM COMPLETE
Area-P 1/2: 3.48 cm2
Height: 69 in
S' Lateral: 4 cm
Weight: 3008 oz

## 2022-08-09 MED ORDER — REGADENOSON 0.4 MG/5ML IV SOLN
0.4000 mg | Freq: Once | INTRAVENOUS | Status: AC
  Administered 2022-08-09: 0.4 mg via INTRAVENOUS

## 2022-08-09 MED ORDER — TECHNETIUM TC 99M TETROFOSMIN IV KIT
10.7000 | PACK | Freq: Once | INTRAVENOUS | Status: AC | PRN
  Administered 2022-08-09: 10.7 via INTRAVENOUS

## 2022-08-09 MED ORDER — PERFLUTREN LIPID MICROSPHERE
1.0000 mL | INTRAVENOUS | Status: AC | PRN
  Administered 2022-08-09: 2 mL via INTRAVENOUS

## 2022-08-09 MED ORDER — TECHNETIUM TC 99M TETROFOSMIN IV KIT
32.9000 | PACK | Freq: Once | INTRAVENOUS | Status: AC | PRN
  Administered 2022-08-09: 32.9 via INTRAVENOUS

## 2022-08-10 ENCOUNTER — Telehealth: Payer: Self-pay

## 2022-08-10 NOTE — Telephone Encounter (Addendum)
Left voice message for patient to give office a call for results of Echo and to get scheduled for appointment with APP on day Dr. Ellyn Hack is in office to discuss stress test.  ----- Message from Almyra Deforest, [place] sent at 08/10/2022 10:52 AM EDT ----- Pumping function low normal, 50-55%. Mild mitral valve leakage, mild aortic valve leakage.   Please arrange an earlier visit on a day Dr. Ellyn Hack is also in the clinic to review stress test and his symptom

## 2022-08-20 ENCOUNTER — Encounter: Payer: Self-pay | Admitting: Physician Assistant

## 2022-08-20 ENCOUNTER — Ambulatory Visit: Payer: Commercial Managed Care - HMO | Attending: Physician Assistant | Admitting: Physician Assistant

## 2022-08-20 VITALS — BP 140/84 | HR 72 | Ht 69.0 in | Wt 193.0 lb

## 2022-08-20 DIAGNOSIS — E785 Hyperlipidemia, unspecified: Secondary | ICD-10-CM | POA: Diagnosis not present

## 2022-08-20 DIAGNOSIS — E119 Type 2 diabetes mellitus without complications: Secondary | ICD-10-CM

## 2022-08-20 DIAGNOSIS — I2510 Atherosclerotic heart disease of native coronary artery without angina pectoris: Secondary | ICD-10-CM

## 2022-08-20 MED ORDER — ISOSORBIDE MONONITRATE ER 30 MG PO TB24: 30.0000 mg | ORAL_TABLET | Freq: Every day | ORAL | 3 refills | Status: AC

## 2022-08-20 NOTE — Patient Instructions (Signed)
Medication Instructions:  START Imdur 30 mg daily  *If you need a refill on your cardiac medications before your next appointment, please call your pharmacy*  Lab Work: NONE ordered at this time of appointment   If you have labs (blood work) drawn today and your tests are completely normal, you will receive your results only by: [place] (if you have MyChart) OR A paper copy in the mail If you have any lab test that is abnormal or we need to change your treatment, we will call you to review the results.  Testing/Procedures: NONE ordered at this time of appointment   Follow-Up: At [facility], you and your health needs are our priority.  As part of our continuing mission to provide you with exceptional heart care, we have created designated Provider Care Teams.  These Care Teams include your primary Cardiologist (physician) and Advanced Practice Providers (APPs -  Physician Assistants and Nurse Practitioners) who all work together to provide you with the care you need, when you need it.  Your next appointment:   6-8 week(s)  The format for your next appointment:   In Person  Provider:   Glenetta Hew, MD  or Almyra Deforest, PA-C        Other Instructions  Important Information About Sugar

## 2022-08-20 NOTE — Progress Notes (Unsigned)
Cardiology Office Note:    Date:  08/22/2022   ID:  Wesley Davila, DOB 1961/03/10, MRN [id number]  PCP:  Horald Pollen, [facility] Cardiologist:  Glenetta Hew, MD     Referring MD: Horald Pollen, *   Chief Complaint  Patient presents with   Follow-up    Stress test results.   Chest Pain    Pressure Friday night.    History of Present Illness:    Wesley Davila is a 61 y.o. male with a hx of hyperlipidemia, DM2 and CAD.  Patient was established with cardiology service in December 2017 due to chest discomfort.  He was referred for a Myoview test which came back showing EF 44%, fixed small mild basal to mid inferior perfusion defect possible diaphragmatic attenuation, no evidence of ischemia.  Subsequent echocardiogram obtained in February 2018 showed EF 55 to 60%, no significant valve issue.  Patient unfortunately was admitted with NSTEMI in June 2020.  Echocardiogram performed on 04/01/2019 showed EF 55 to 60%, no significant wall motion abnormality, mild to moderate AI.  Subsequent cardiac catheterization performed on 04/01/2019 showed single-vessel CAD, 60% distal LAD lesion, 90% proximal to mid left circumflex lesion treated with a Resolute Onyx 3.5 x 12 mm DES, no significant disease in left circumflex or RCA, EF 55 to 65% by visual estimate.  Crestor was increased to 40 mg daily.  LDL was 156 during hospitalization, total cholesterol 272, triglyceride 201.    I last saw the patient on December 2021 for evaluation of presyncope, a heart monitor was obtained in January 2022 which came back essentially normal with rare PAC and PVCs.  Most of the patient triggered events were associated with sinus rhythm.  Patient was last seen by Dr. Ellyn Hack in March 2023 at which time he was doing well without significant chest discomfort.  Recent echocardiogram showed EF 50 to 55%.  No wall motion abnormality.  Myoview however showed medium defect of mild  reduction in uptake present in the apical to mid inferior and inferolateral location that is partially reversible.  There was abnormal wall motion in the defect area consistent with infarction.  EF was 44%.  I discussed with the patient possibility of medical management versus cardiac catheterization.  He is aware that medical management does not take away the underlying blockage.  He still prefer to proceed with medical management.  He has a trip flying to [place] for 1 week starting on November 15.  I added 30 mg daily of Imdur to his medical regimen.  I plan to see the patient back in 6 to 8 weeks.   Past Medical History:  Diagnosis Date   2v CAD S/P DES PCI LCx, med Rx dLAD 60%. 04/05/2019   04/01/2019: Distal LAD 60%.  Proximal mid LCx 90%-(DES PCI Resolute Onyx 3.5 x 12).  EF 55 to 60%.   Diabetes mellitus type 2 with complications (HCC)    CAD.   History of NSTEMI 04/01/2019   EF 55-60%. PCI LCx.   Hyperlipidemia    patient-activated cardiac event monitor 10/2020   Essentially normal.  No arrhythmias.  Rare PACs and PVCs.  15 patient triggered events.  Mostly associated with sinus rhythm-sinus tachycardia and PVCs.    Past Surgical History:  Procedure Laterality Date   CORONARY STENT INTERVENTION N/A 04/01/2019   Procedure: CORONARY STENT INTERVENTION;  Surgeon: Martinique, Peter M, MD;  Location: [place] [facility];  Service: Cardiovascular;  p-mLCx 90% ->  0%: Resolute Onyx DES 3.5 x 12.   LEFT HEART CATH AND CORONARY ANGIOGRAPHY N/A 04/01/2019   Procedure: LEFT HEART CATH AND CORONARY ANGIOGRAPHY;  Surgeon: Martinique, Peter M, MD;  Location: [place] [facility];  Service: Cardiovascular; NSTEMI: Distal LAD 60%.  Proximal mid LCx 90%-(DES PCI.  EF 55 to 60%.   NM MYOVIEW LTD  09/2016   Fixed small, mild basal-mid inferior perfusion defect likely diaphragmatic attenuation. No evidence of ischemia. EF 44% with diffuse hypokinesis per computer read, but visually it would appear to be better.  Considered intermediate risk.   TRANSTHORACIC ECHOCARDIOGRAM Right 04/28/2019   EF 55 to 60%.  Impaired relaxation.  No R WMA.  Mild aortic valve sclerosis with no stenosis.  Normal RV.    Current Medications: Current Meds  Medication Sig   aspirin 81 MG chewable tablet Chew 1 tablet (81 mg total) by mouth daily.   glucose blood (ONETOUCH ULTRA) test strip USE ONCE DAILY AS DIRECTED   isosorbide mononitrate (IMDUR) 30 MG 24 hr tablet Take 1 tablet (30 mg total) by mouth daily.   metFORMIN (GLUCOPHAGE) 1000 MG tablet Take 1 tablet (1,000 mg total) by mouth 2 (two) times daily with a meal.   metoprolol tartrate (LOPRESSOR) 50 MG tablet Take 1.5 tablets (75 mg total) by mouth 2 (two) times daily.   nitroGLYCERIN (NITROSTAT) 0.4 MG SL tablet Place 1 tablet (0.4 mg total) under the tongue every 5 (five) minutes as needed for chest pain.     Allergies:   Patient has no known allergies.   Social History   Socioeconomic History   Marital status: Married    Spouse name: Not on file   Number of children: 3   Years of education: Not on file   Highest education level: Not on file  Occupational History   Occupation: Agricultural consultant    Comment: Secondary school teacher  Tobacco Use   Smoking status: Never   Smokeless tobacco: Current    Types: Snuff   Tobacco comments:    since 1980   Substance and Sexual Activity   Alcohol use: No   Drug use: No   Sexual activity: Not on file  Other Topics Concern   Not on file  Social History Narrative   Has 6 brothers, 6 sisters - 2 Brothers with with DM & HTN       Social Determinants of Health   Financial Resource Strain: Not on file  Food Insecurity: Not on file  Transportation Needs: Not on file  Physical Activity: Not on file  Stress: Not on file  Social Connections: Not on file     Family History: The patient's family history includes Breast cancer in his sister; Diabetes in his brother and brother; Heart disease (age of onset: 60) in his father;  Hypertension in his brother and brother; Stroke in his father. There is no history of Colon cancer.  ROS:   Please see the history of present illness.     All other systems reviewed and are negative.  EKGs/Labs/Other Studies Reviewed:    The following studies were reviewed today:  Myoview 08/09/2022   Findings are consistent with prior myocardial infarction. The study is intermediate risk.   No ST deviation was noted.   LV perfusion is abnormal. Defect 1: There is a medium defect with mild reduction in uptake present in the apical to mid inferior and inferolateral location(s) that is partially reversible. There is abnormal wall motion in the defect area. Consistent with infarction.  Left ventricular function is abnormal. Global function is moderately reduced. Nuclear stress EF: 36 %. The left ventricular ejection fraction is moderately decreased (30-44%). End diastolic cavity size is mildly enlarged. End systolic cavity size is mildly enlarged.   Prior study available for comparison from 10/25/2016. LVEF 44%, fixed basal to mid inferior perfusion defect   Moderate size and mild intensity mostly fixed (SDS 1) apical, inferoapical and apical lateral perfusion defect, suggestive of possible scar or less likely artifact. LVEF 36% with global hypokinesis and apical/apical lateral akinesis. This is an intermediate risk study. Compared to a prior study in 2017, the LVEF is lower and the perfusion defect does not match with the prior study.  EKG:  EKG is ordered today.  The ekg ordered today demonstrates normal sinus rhythm, no significant ST-T wave changes.  Recent Labs: 07/24/2022: ALT 15; BUN 9; Creatinine, Ser 0.83; Hemoglobin 14.5; Platelets 318; Potassium 4.9; Sodium 133  Recent Lipid Panel    Component Value Date/Time   CHOL 146 07/06/2021 1417   TRIG 193 (H) 07/06/2021 1417   HDL 48 07/06/2021 1417   CHOLHDL 3.0 07/06/2021 1417   CHOLHDL 5.0 04/01/2019 0318   VLDL 60 (H) 04/01/2019  0318   LDLCALC 66 07/06/2021 1417     Risk Assessment/Calculations:           Physical Exam:    VS:  BP (!) 140/84 (BP Location: Right Arm, Patient Position: Sitting, Cuff Size: Normal)   Pulse 72   Ht '5\' 9"'$  (1.753 m)   Wt 193 lb (87.5 kg)   BMI 28.50 kg/m        Wt Readings from Last 3 Encounters:  08/20/22 193 lb (87.5 kg)  08/09/22 188 lb (85.3 kg)  07/24/22 188 lb (85.3 kg)     GEN:  Well nourished, well developed in no acute distress HEENT: Normal NECK: No JVD; No carotid bruits LYMPHATICS: No lymphadenopathy CARDIAC: RRR, no murmurs, rubs, gallops RESPIRATORY:  Clear to auscultation without rales, wheezing or rhonchi  ABDOMEN: Soft, non-tender, non-distended MUSCULOSKELETAL:  No edema; No deformity  SKIN: Warm and dry NEUROLOGIC:  Alert and oriented x 3 PSYCHIATRIC:  Normal affect   ASSESSMENT:    1. Coronary artery disease involving native heart, unspecified vessel or lesion type, unspecified whether angina present   2. Hyperlipidemia LDL goal <70   3. Controlled type 2 diabetes mellitus without complication, without long-term current use of insulin (HCC)    PLAN:    In order of problems listed above:  CAD: Recently underwent Myoview which came back abnormal showing a medium defect of mild reduction in uptake present in the apical to mid inferior and inferolateral location that was partially reversible, this was consistent with infarction.  Recent echocardiogram shows no regional wall motion abnormality and EF was 50 to 55%.  I discussed with the patient and Dr. Ellyn Hack regarding possibility of proceeding with cardiac catheterization.  He prefers medical therapy for now.  He is aware that medical therapy does not take away underlying blockages.  He is also aware that if his chest pain come back and it does not go away, he need to seek urgent medical attention.  I will start him on Imdur 30 mg daily.  Hyperlipidemia: On rosuvastatin  DM2: On metformin.             Medication Adjustments/Labs and Tests Ordered: Current medicines are reviewed at length with the patient today.  Concerns regarding medicines are outlined above.  Orders Placed This  Encounter  Procedures   EKG 12-Lead   Meds ordered this encounter  Medications   isosorbide mononitrate (IMDUR) 30 MG 24 hr tablet    Sig: Take 1 tablet (30 mg total) by mouth daily.    Dispense:  90 tablet    Refill:  3    Patient Instructions  Medication Instructions:  START Imdur 30 mg daily  *If you need a refill on your cardiac medications before your next appointment, please call your pharmacy*  Lab Work: NONE ordered at this time of appointment   If you have labs (blood work) drawn today and your tests are completely normal, you will receive your results only by: [place] (if you have MyChart) OR A paper copy in the mail If you have any lab test that is abnormal or we need to change your treatment, we will call you to review the results.  Testing/Procedures: NONE ordered at this time of appointment   Follow-Up: At [facility], you and your health needs are our priority.  As part of our continuing mission to provide you with exceptional heart care, we have created designated Provider Care Teams.  These Care Teams include your primary Cardiologist (physician) and Advanced Practice Providers (APPs -  Physician Assistants and Nurse Practitioners) who all work together to provide you with the care you need, when you need it.  Your next appointment:   6-8 week(s)  The format for your next appointment:   In Person  Provider:   Glenetta Hew, MD  or Almyra Deforest, PA-C        Other Instructions  Important Information About Sugar         Hilbert Corrigan, [place]  08/22/2022 4:32 PM    [place]

## 2022-08-21 ENCOUNTER — Ambulatory Visit: Payer: 59 | Admitting: Physician Assistant

## 2022-10-10 ENCOUNTER — Ambulatory Visit: Payer: Commercial Managed Care - HMO | Admitting: Physician Assistant

## 2022-11-16 ENCOUNTER — Ambulatory Visit: Payer: BLUE CROSS/BLUE SHIELD | Admitting: Family Medicine

## 2022-11-16 NOTE — Progress Notes (Deleted)
Cardiology Clinic Note   Patient Name: Wesley Davila Date of Encounter: 11/16/2022  Primary Care Provider:  Horald Pollen, MD Primary Cardiologist:  Glenetta Hew, MD  Patient Profile    ***  Past Medical History    Past Medical History:  Diagnosis Date   2v CAD S/P DES PCI LCx, med Rx dLAD 60%. 04/05/2019   04/01/2019: Distal LAD 60%.  Proximal mid LCx 90%-(DES PCI Resolute Onyx 3.5 x 12).  EF 55 to 60%.   Diabetes mellitus type 2 with complications (HCC)    CAD.   History of NSTEMI 04/01/2019   EF 55-60%. PCI LCx.   Hyperlipidemia    patient-activated cardiac event monitor 10/2020   Essentially normal.  No arrhythmias.  Rare PACs and PVCs.  15 patient triggered events.  Mostly associated with sinus rhythm-sinus tachycardia and PVCs.   Past Surgical History:  Procedure Laterality Date   CORONARY STENT INTERVENTION N/A 04/01/2019   Procedure: CORONARY STENT INTERVENTION;  Surgeon: Martinique, Peter M, MD;  Location: [place] [facility];  Service: Cardiovascular;  p-mLCx 90% -> 0%: Resolute Onyx DES 3.5 x 12.   LEFT HEART CATH AND CORONARY ANGIOGRAPHY N/A 04/01/2019   Procedure: LEFT HEART CATH AND CORONARY ANGIOGRAPHY;  Surgeon: Martinique, Peter M, MD;  Location: [place] [facility];  Service: Cardiovascular; NSTEMI: Distal LAD 60%.  Proximal mid LCx 90%-(DES PCI.  EF 55 to 60%.   NM MYOVIEW LTD  09/2016   Fixed small, mild basal-mid inferior perfusion defect likely diaphragmatic attenuation. No evidence of ischemia. EF 44% with diffuse hypokinesis per computer read, but visually it would appear to be better. Considered intermediate risk.   TRANSTHORACIC ECHOCARDIOGRAM Right 04/28/2019   EF 55 to 60%.  Impaired relaxation.  No R WMA.  Mild aortic valve sclerosis with no stenosis.  Normal RV.    Allergies  No Known Allergies  History of Present Illness    ***  Home Medications    Prior to Admission medications   Medication Sig Start Date End Date Taking? Authorizing  Provider  aspirin 81 MG chewable tablet Chew 1 tablet (81 mg total) by mouth daily. 04/02/19   Georgette Shell, MD  glucose blood [facility] ULTRA) test strip USE ONCE DAILY AS DIRECTED 09/28/20   Horald Pollen, MD  isosorbide mononitrate (IMDUR) 30 MG 24 hr tablet Take 1 tablet (30 mg total) by mouth daily. 08/20/22   Almyra Deforest, PA  metFORMIN (GLUCOPHAGE) 1000 MG tablet Take 1 tablet (1,000 mg total) by mouth 2 (two) times daily with a meal. 01/04/22   Sagardia, Ines Bloomer, MD  metoprolol tartrate (LOPRESSOR) 50 MG tablet Take 1.5 tablets (75 mg total) by mouth 2 (two) times daily. 07/24/22   Almyra Deforest, PA  nitroGLYCERIN (NITROSTAT) 0.4 MG SL tablet Place 1 tablet (0.4 mg total) under the tongue every 5 (five) minutes as needed for chest pain. 07/24/22 10/22/22  Almyra Deforest, PA  rosuvastatin (CRESTOR) 40 MG tablet Take 1 tablet (40 mg total) by mouth daily. 01/08/22 07/24/22  Leonie Man, MD    Family History    Family History  Problem Relation Age of Onset   Heart disease Father 35       MI x 2   Stroke Father    Hypertension Brother    Diabetes Brother    Hypertension Brother    Diabetes Brother    Breast cancer Sister    Colon cancer Neg Hx    He indicated that  his mother is deceased. He indicated that his father is deceased. He indicated that five of his six sisters are alive. He indicated that all of his six brothers are alive. He indicated that the status of his neg hx is unknown.  Social History    Social History   Socioeconomic History   Marital status: Married    Spouse name: Not on file   Number of children: 3   Years of education: Not on file   Highest education level: Not on file  Occupational History   Occupation: Agricultural consultant    Comment: Secondary school teacher  Tobacco Use   Smoking status: Never   Smokeless tobacco: Current    Types: Snuff   Tobacco comments:    since 1980   Substance and Sexual Activity   Alcohol use: No   Drug use: No   Sexual activity:  Not on file  Other Topics Concern   Not on file  Social History Narrative   Has 6 brothers, 6 sisters - 2 Brothers with with DM & HTN       Social Determinants of Health   Financial Resource Strain: Not on file  Food Insecurity: Not on file  Transportation Needs: Not on file  Physical Activity: Not on file  Stress: Not on file  Social Connections: Not on file  Intimate Partner Violence: Not on file     Review of Systems    General:  No chills, fever, night sweats or weight changes.  Cardiovascular:  No chest pain, dyspnea on exertion, edema, orthopnea, palpitations, paroxysmal nocturnal dyspnea. Dermatological: No rash, lesions/masses Respiratory: No cough, dyspnea Urologic: No hematuria, dysuria Abdominal:   No nausea, vomiting, diarrhea, bright red blood per rectum, melena, or hematemesis Neurologic:  No visual changes, wkns, changes in mental status. All other systems reviewed and are otherwise negative except as noted above.  Physical Exam    VS:  There were no vitals taken for this visit. , BMI There is no height or weight on file to calculate BMI. GEN: Well nourished, well developed, in no acute distress. HEENT: normal. Neck: Supple, no JVD, carotid bruits, or masses. Cardiac: RRR, no murmurs, rubs, or gallops. No clubbing, cyanosis, edema.  Radials/DP/PT 2+ and equal bilaterally.  Respiratory:  Respirations regular and unlabored, clear to auscultation bilaterally. GI: Soft, nontender, nondistended, BS + x 4. MS: no deformity or atrophy. Skin: warm and dry, no rash. Neuro:  Strength and sensation are intact. Psych: Normal affect.  Accessory Clinical Findings    Recent Labs: 07/24/2022: ALT 15; BUN 9; Creatinine, Ser 0.83; Hemoglobin 14.5; Platelets 318; Potassium 4.9; Sodium 133   Recent Lipid Panel    Component Value Date/Time   CHOL 146 07/06/2021 1417   TRIG 193 (H) 07/06/2021 1417   HDL 48 07/06/2021 1417   CHOLHDL 3.0 07/06/2021 1417   CHOLHDL 5.0  04/01/2019 0318   VLDL 60 (H) 04/01/2019 0318   LDLCALC 66 07/06/2021 1417    No BP recorded.  {Refresh Note OR Click here to enter BP  :1}***    ECG personally reviewed by me today- *** - No acute changes  Assessment & Plan   1.  ***   Jossie Ng. Tzvi Economou NP-C     11/16/2022, 8:52 AM [place] [address] Office [phone] Fax [phone]    I spent***minutes examining this patient, reviewing medications, and using patient centered shared decision making involving her cardiac care.  Prior to her visit I spent  greater than 20 minutes reviewing her past medical history,  medications, and prior cardiac tests.

## 2022-11-19 ENCOUNTER — Ambulatory Visit: Payer: Self-pay | Admitting: General Practice

## 2022-12-14 NOTE — Progress Notes (Deleted)
Cardiology Clinic Note   Patient Name: Wesley Davila Date of Encounter: 12/14/2022  Primary Care Provider:  Horald Pollen, MD Primary Cardiologist:  Glenetta Hew, MD  Patient Profile    Wesley Davila 62 year old male presents to the clinic today for follow-up evaluation of his coronary artery disease and hyperlipidemia.   Past Medical History    Past Medical History:  Diagnosis Date   2v CAD S/P DES PCI LCx, med Rx dLAD 60%. 04/05/2019   04/01/2019: Distal LAD 60%.  Proximal mid LCx 90%-(DES PCI Resolute Onyx 3.5 x 12).  EF 55 to 60%.   Diabetes mellitus type 2 with complications (HCC)    CAD.   History of NSTEMI 04/01/2019   EF 55-60%. PCI LCx.   Hyperlipidemia    patient-activated cardiac event monitor 10/2020   Essentially normal.  No arrhythmias.  Rare PACs and PVCs.  15 patient triggered events.  Mostly associated with sinus rhythm-sinus tachycardia and PVCs.   Past Surgical History:  Procedure Laterality Date   CORONARY STENT INTERVENTION N/A 04/01/2019   Procedure: CORONARY STENT INTERVENTION;  Surgeon: Martinique, Peter M, MD;  Location: [facility];  Service: Cardiovascular;  p-mLCx 90% -> 0%: Resolute Onyx DES 3.5 x 12.   LEFT HEART CATH AND CORONARY ANGIOGRAPHY N/A 04/01/2019   Procedure: LEFT HEART CATH AND CORONARY ANGIOGRAPHY;  Surgeon: Martinique, Peter M, MD;  Location: [facility];  Service: Cardiovascular; NSTEMI: Distal LAD 60%.  Proximal mid LCx 90%-(DES PCI.  EF 55 to 60%.   NM MYOVIEW LTD  09/2016   Fixed small, mild basal-mid inferior perfusion defect likely diaphragmatic attenuation. No evidence of ischemia. EF 44% with diffuse hypokinesis per computer read, but visually it would appear to be better. Considered intermediate risk.   TRANSTHORACIC ECHOCARDIOGRAM Right 04/28/2019   EF 55 to 60%.  Impaired relaxation.  No R WMA.  Mild aortic valve sclerosis with no stenosis.  Normal RV.    Allergies  No Known Allergies  History of  Present Illness    Wesley Davila has a PMH of coronary artery disease, type 2 diabetes, hyperlipidemia.  He was initially seen by cardiology 12/17 due to chest discomfort.  He underwent nuclear stress testing which showed an EF of 55% and small fixed mild basal-mid inferior perfusion defect with possible diaphragmatic attenuation.  There is no evidence of ischemia.  Subsequently he had echocardiogram 2/18 which showed an EF of 55-60%, no significant valvular abnormalities.  He was admitted with NSTEMI 6/20.  Echocardiogram 04/01/2019 showed an EF of 55-60% and no significant wall motion abnormality, mild-moderate AI.  Cardiac catheterization 04/01/2019 showed single-vessel CAD, 60% distal LAD, 90% proximal-mid left circumflex lesion treated with PCI and DES.  There was no significant disease in his circumflex or RCA.  His EF was noted to be 55-65%.  His rosuvastatin was increased to 40 mg daily.  His LDL was noted to be 156 during his hospitalization with a total cholesterol of 272 and triglycerides of 201.   He was seen by Almyra Deforest PA-C 12/21 for evaluation of presyncope.  A cardiac event monitor 1/22 showed normal sinus rhythm with rare PACs and PVCs.  His triggered events were associated with sinus rhythm.  He was seen by Dr. Ellyn Hack 3/23 and doing well at that time.  He denied significant chest discomfort.  He was seen by Almyra Deforest PA-C on 08/20/2022.  During that time he had undergone repeat echocardiogram which showed an EF of 50-55%, no  regional wall motion abnormalities, Myoview showed medium defect of mild reduction in uptake present in the apical to mid inferior and inferior lateral wall that was noted to be reversible.  Abnormal wall motion was noted and defect area.  EF was 44%.  Medical management was discussed with the patient versus cardiac catheterization.  He wished to continue with medical management.  He reported that he would be taking a trip to [place] for 1 week in November.  He was  prescribed Imdur 30 mg daily.  Follow-up was planned for 6 to 8 weeks.  He presents to the clinic today for follow-up evaluation and states***  *** denies chest pain, shortness of breath, lower extremity edema, fatigue, palpitations, melena, hematuria, hemoptysis, diaphoresis, weakness, presyncope, syncope, orthopnea, and PND.  Coronary artery disease-no chest pain today.  Reviewed nuclear stress testing.  Details above.  Tolerating Imdur well. Continue medical therapy Heart healthy low-sodium diet Increase physical activity as tolerated  Hyperlipidemia-LDL*** Continue aspirin, rosuvastatin Heart healthy low-sodium high-fiber diet Increase physical activity as tolerated  Type 2 diabetes-glucose 258 on 07/24/2022. Heart healthy low-sodium carb modified diet Increase physical activity as tolerated Continue metformin Follows with PCP  Disposition: Follow-up with Dr. Ellyn Hack, [facility] PA-C, or me in 4 to 6 months. Home Medications    Prior to Admission medications   Medication Sig Start Date End Date Taking? Authorizing Provider  aspirin 81 MG chewable tablet Chew 1 tablet (81 mg total) by mouth daily. 04/02/19   Georgette Shell, MD  glucose blood [facility] ULTRA) test strip USE ONCE DAILY AS DIRECTED 09/28/20   Horald Pollen, MD  isosorbide mononitrate (IMDUR) 30 MG 24 hr tablet Take 1 tablet (30 mg total) by mouth daily. 08/20/22   Almyra Deforest, PA  metFORMIN (GLUCOPHAGE) 1000 MG tablet Take 1 tablet (1,000 mg total) by mouth 2 (two) times daily with a meal. 01/04/22   Sagardia, Ines Bloomer, MD  metoprolol tartrate (LOPRESSOR) 50 MG tablet Take 1.5 tablets (75 mg total) by mouth 2 (two) times daily. 07/24/22   Almyra Deforest, PA  nitroGLYCERIN (NITROSTAT) 0.4 MG SL tablet Place 1 tablet (0.4 mg total) under the tongue every 5 (five) minutes as needed for chest pain. 07/24/22 10/22/22  Almyra Deforest, PA  rosuvastatin (CRESTOR) 40 MG tablet Take 1 tablet (40 mg total) by mouth daily. 01/08/22  07/24/22  Leonie Man, MD    Family History    Family History  Problem Relation Age of Onset   Heart disease Father 54       MI x 2   Stroke Father    Hypertension Brother    Diabetes Brother    Hypertension Brother    Diabetes Brother    Breast cancer Sister    Colon cancer Neg Hx    He indicated that his mother is deceased. He indicated that his father is deceased. He indicated that five of his six sisters are alive. He indicated that all of his six brothers are alive. He indicated that the status of his neg hx is unknown.  Social History    Social History   Socioeconomic History   Marital status: Married    Spouse name: Not on file   Number of children: 3   Years of education: Not on file   Highest education level: Not on file  Occupational History   Occupation: Agricultural consultant    Comment: Secondary school teacher  Tobacco Use   Smoking status: Never   Smokeless tobacco: Current  Types: Snuff   Tobacco comments:    since 1980   Substance and Sexual Activity   Alcohol use: No   Drug use: No   Sexual activity: Not on file  Other Topics Concern   Not on file  Social History Narrative   Has 6 brothers, 6 sisters - 2 Brothers with with DM & HTN       Social Determinants of Health   Financial Resource Strain: Not on file  Food Insecurity: Not on file  Transportation Needs: Not on file  Physical Activity: Not on file  Stress: Not on file  Social Connections: Not on file  Intimate Partner Violence: Not on file     Review of Systems    General:  No chills, fever, night sweats or weight changes.  Cardiovascular:  No chest pain, dyspnea on exertion, edema, orthopnea, palpitations, paroxysmal nocturnal dyspnea. Dermatological: No rash, lesions/masses Respiratory: No cough, dyspnea Urologic: No hematuria, dysuria Abdominal:   No nausea, vomiting, diarrhea, bright red blood per rectum, melena, or hematemesis Neurologic:  No visual changes, wkns, changes in mental  status. All other systems reviewed and are otherwise negative except as noted above.  Physical Exam    VS:  There were no vitals taken for this visit. , BMI There is no height or weight on file to calculate BMI. GEN: Well nourished, well developed, in no acute distress. HEENT: normal. Neck: Supple, no JVD, carotid bruits, or masses. Cardiac: RRR, no murmurs, rubs, or gallops. No clubbing, cyanosis, edema.  Radials/DP/PT 2+ and equal bilaterally.  Respiratory:  Respirations regular and unlabored, clear to auscultation bilaterally. GI: Soft, nontender, nondistended, BS + x 4. MS: no deformity or atrophy. Skin: warm and dry, no rash. Neuro:  Strength and sensation are intact. Psych: Normal affect.  Accessory Clinical Findings    Recent Labs: 07/24/2022: ALT 15; BUN 9; Creatinine, Ser 0.83; Hemoglobin 14.5; Platelets 318; Potassium 4.9; Sodium 133   Recent Lipid Panel    Component Value Date/Time   CHOL 146 07/06/2021 1417   TRIG 193 (H) 07/06/2021 1417   HDL 48 07/06/2021 1417   CHOLHDL 3.0 07/06/2021 1417   CHOLHDL 5.0 04/01/2019 0318   VLDL 60 (H) 04/01/2019 0318   LDLCALC 66 07/06/2021 1417    No BP recorded.  {Refresh Note OR Click here to enter BP  :1}***    ECG personally reviewed by me today- *** - No acute changes  Echocardiogram 08/09/2022 IMPRESSIONS     1. Left ventricular ejection fraction, by estimation, is 50 to 55%. The  left ventricle has low normal function. The left ventricle has no regional  wall motion abnormalities. Left ventricular diastolic parameters are  consistent with Grade I diastolic  dysfunction (impaired relaxation).   2. Right ventricular systolic function is normal. The right ventricular  size is normal. Tricuspid regurgitation signal is inadequate for assessing  PA pressure.   3. The mitral valve is grossly normal. Mild mitral valve regurgitation.   4. The aortic valve was not well visualized. There is moderate  calcification of the  aortic valve. There is moderate thickening of the  aortic valve. Aortic valve regurgitation is mild. Aortic valve  sclerosis/calcification is present, without any evidence   of aortic stenosis.   5. Aortic dilatation noted. There is borderline dilatation of the aortic  root, measuring 36 mm.   6. The inferior vena cava is normal in size with greater than 50%  respiratory variability, suggesting right atrial pressure of 3 mmHg.  Comparison(s): Prior images reviewed side by side. Compared to prior TTE  on 03/2019, the EF appears stable and the AI appears mild (previously  reported as mild-to-moderate).   FINDINGS   Left Ventricle: Left ventricular ejection fraction, by estimation, is 50  to 55%. The left ventricle has low normal function. The left ventricle has  no regional wall motion abnormalities. The left ventricular internal  cavity size was normal in size.  There is no left ventricular hypertrophy. Left ventricular diastolic  parameters are consistent with Grade I diastolic dysfunction (impaired  relaxation).   Right Ventricle: The right ventricular size is normal. No increase in  right ventricular wall thickness. Right ventricular systolic function is  normal. Tricuspid regurgitation signal is inadequate for assessing PA  pressure.   Left Atrium: Left atrial size was normal in size.   Right Atrium: Right atrial size was normal in size.   Pericardium: There is no evidence of pericardial effusion.   Mitral Valve: The mitral valve is grossly normal. There is mild thickening  of the mitral valve leaflet(s). There is mild calcification of the mitral  valve leaflet(s). Mild to moderate mitral annular calcification. Mild  mitral valve regurgitation.   Tricuspid Valve: The tricuspid valve is normal in structure. Tricuspid  valve regurgitation is not demonstrated.   Aortic Valve: The aortic valve was not well visualized. There is moderate  calcification of the aortic valve.  There is moderate thickening of the  aortic valve. Aortic valve regurgitation is mild. Aortic valve  sclerosis/calcification is present, without  any evidence of aortic stenosis.   Pulmonic Valve: The pulmonic valve was not well visualized. Pulmonic valve  regurgitation is trivial.   Aorta: Aortic dilatation noted. There is borderline dilatation of the  aortic root, measuring 36 mm.   Venous: The inferior vena cava is normal in size with greater than 50%  respiratory variability, suggesting right atrial pressure of 3 mmHg.   IAS/Shunts: The atrial septum is grossly normal.    Nuclear stress test 08/09/2022    Findings are consistent with prior myocardial infarction. The study is intermediate risk.   No ST deviation was noted.   LV perfusion is abnormal. Defect 1: There is a medium defect with mild reduction in uptake present in the apical to mid inferior and inferolateral location(s) that is partially reversible. There is abnormal wall motion in the defect area. Consistent with infarction.   Left ventricular function is abnormal. Global function is moderately reduced. Nuclear stress EF: 36 %. The left ventricular ejection fraction is moderately decreased (30-44%). End diastolic cavity size is mildly enlarged. End systolic cavity size is mildly enlarged.   Prior study available for comparison from 10/25/2016. LVEF 44%, fixed basal to mid inferior perfusion defect   Moderate size and mild intensity mostly fixed (SDS 1) apical, inferoapical and apical lateral perfusion defect, suggestive of possible scar or less likely artifact. LVEF 36% with global hypokinesis and apical/apical lateral akinesis. This is an intermediate risk study. Compared to a prior study in 2017, the LVEF is lower and the perfusion defect does not match with the prior study.  Assessment & Plan   1.  ***   Jossie Ng. Lamarcus Spira NP-C     12/14/2022, 7:39 AM [facility] [address] Office [phone] Fax [phone]    I spent***minutes examining this patient, reviewing medications, and using patient centered shared decision making involving her cardiac care.  Prior to her visit I spent greater than  20 minutes reviewing her past medical history,  medications, and prior cardiac tests.

## 2022-12-17 ENCOUNTER — Ambulatory Visit: Payer: BLUE CROSS/BLUE SHIELD | Attending: Physician Assistant | Admitting: General Practice

## 2023-01-28 NOTE — Progress Notes (Deleted)
Cardiology Clinic Note   Patient Name: Trayon Kania Date of Encounter: 01/28/2023  Primary Care Provider:  Horald Pollen, MD Primary Cardiologist:  Glenetta Hew, MD  Patient Profile    Shadd Legnon 62 year old male presents to the clinic today for follow-up evaluation of his coronary artery disease and hyperlipidemia.   Past Medical History    Past Medical History:  Diagnosis Date   2v CAD S/P DES PCI LCx, med Rx dLAD 60%. 04/05/2019   04/01/2019: Distal LAD 60%.  Proximal mid LCx 90%-(DES PCI Resolute Onyx 3.5 x 12).  EF 55 to 60%.   Diabetes mellitus type 2 with complications (HCC)    CAD.   History of NSTEMI 04/01/2019   EF 55-60%. PCI LCx.   Hyperlipidemia    patient-activated cardiac event monitor 10/2020   Essentially normal.  No arrhythmias.  Rare PACs and PVCs.  15 patient triggered events.  Mostly associated with sinus rhythm-sinus tachycardia and PVCs.   Past Surgical History:  Procedure Laterality Date   CORONARY STENT INTERVENTION N/A 04/01/2019   Procedure: CORONARY STENT INTERVENTION;  Surgeon: Martinique, Peter M, MD;  Location: [facility];  Service: Cardiovascular;  p-mLCx 90% -> 0%: Resolute Onyx DES 3.5 x 12.   LEFT HEART CATH AND CORONARY ANGIOGRAPHY N/A 04/01/2019   Procedure: LEFT HEART CATH AND CORONARY ANGIOGRAPHY;  Surgeon: Martinique, Peter M, MD;  Location: [facility];  Service: Cardiovascular; NSTEMI: Distal LAD 60%.  Proximal mid LCx 90%-(DES PCI.  EF 55 to 60%.   NM MYOVIEW LTD  09/2016   Fixed small, mild basal-mid inferior perfusion defect likely diaphragmatic attenuation. No evidence of ischemia. EF 44% with diffuse hypokinesis per computer read, but visually it would appear to be better. Considered intermediate risk.   TRANSTHORACIC ECHOCARDIOGRAM Right 04/28/2019   EF 55 to 60%.  Impaired relaxation.  No R WMA.  Mild aortic valve sclerosis with no stenosis.  Normal RV.    Allergies  No Known Allergies  History of  Present Illness    Davien Dragonetti has a PMH of coronary artery disease, type 2 diabetes, hyperlipidemia.  He was initially seen by cardiology 12/17 due to chest discomfort.  He underwent nuclear stress testing which showed an EF of 55% and small fixed mild basal-mid inferior perfusion defect with possible diaphragmatic attenuation.  There is no evidence of ischemia.  Subsequently he had echocardiogram 2/18 which showed an EF of 55-60%, no significant valvular abnormalities.  He was admitted with NSTEMI 6/20.  Echocardiogram 04/01/2019 showed an EF of 55-60% and no significant wall motion abnormality, mild-moderate AI.  Cardiac catheterization 04/01/2019 showed single-vessel CAD, 60% distal LAD, 90% proximal-mid left circumflex lesion treated with PCI and DES.  There was no significant disease in his circumflex or RCA.  His EF was noted to be 55-65%.  His rosuvastatin was increased to 40 mg daily.  His LDL was noted to be 156 during his hospitalization with a total cholesterol of 272 and triglycerides of 201.     He was seen by Almyra Deforest PA-C 12/21 for evaluation of presyncope.  A cardiac event monitor 1/22 showed normal sinus rhythm with rare PACs and PVCs.  His triggered events were associated with sinus rhythm.  He was seen by Dr. Ellyn Hack 3/23 and doing well at that time.  He denied significant chest discomfort.   He was seen by Almyra Deforest PA-C on 08/20/2022.  During that time he had undergone repeat echocardiogram which showed an EF  of 50-55%, no regional wall motion abnormalities, Myoview showed medium defect of mild reduction in uptake present in the apical to mid inferior and inferior lateral wall that was noted to be reversible.  Abnormal wall motion was noted and defect area.  EF was 44%.  Medical management was discussed with the patient versus cardiac catheterization.  He wished to continue with medical management.  He reported that he would be taking a trip to [place] for 1 week in November.  He was  prescribed Imdur 30 mg daily.  Follow-up was planned for 6 to 8 weeks.   He presents to the clinic today for follow-up evaluation and states***   *** denies chest pain, shortness of breath, lower extremity edema, fatigue, palpitations, melena, hematuria, hemoptysis, diaphoresis, weakness, presyncope, syncope, orthopnea, and PND.   Coronary artery disease-no chest pain today.  Reviewed nuclear stress testing.  Details above.  Tolerating Imdur well. Continue medical therapy Heart healthy low-sodium diet Increase physical activity as tolerated   Hyperlipidemia-LDL*** Continue aspirin, rosuvastatin Heart healthy low-sodium high-fiber diet Increase physical activity as tolerated   Type 2 diabetes-glucose 258 on 07/24/2022. Heart healthy low-sodium carb modified diet Increase physical activity as tolerated Continue metformin Follows with PCP   Disposition: Follow-up with Dr. Ellyn Hack, [facility] PA-C, or me in 4 to 6 months.  Home Medications    Prior to Admission medications   Medication Sig Start Date End Date Taking? Authorizing Provider  aspirin 81 MG chewable tablet Chew 1 tablet (81 mg total) by mouth daily. 04/02/19   Georgette Shell, MD  glucose blood [facility] ULTRA) test strip USE ONCE DAILY AS DIRECTED 09/28/20   Horald Pollen, MD  isosorbide mononitrate (IMDUR) 30 MG 24 hr tablet Take 1 tablet (30 mg total) by mouth daily. 08/20/22   Almyra Deforest, PA  metFORMIN (GLUCOPHAGE) 1000 MG tablet Take 1 tablet (1,000 mg total) by mouth 2 (two) times daily with a meal. 01/04/22   Sagardia, Ines Bloomer, MD  metoprolol tartrate (LOPRESSOR) 50 MG tablet Take 1.5 tablets (75 mg total) by mouth 2 (two) times daily. 07/24/22   Almyra Deforest, PA  nitroGLYCERIN (NITROSTAT) 0.4 MG SL tablet Place 1 tablet (0.4 mg total) under the tongue every 5 (five) minutes as needed for chest pain. 07/24/22 10/22/22  Almyra Deforest, PA  rosuvastatin (CRESTOR) 40 MG tablet Take 1 tablet (40 mg total) by mouth daily.  01/08/22 07/24/22  Leonie Man, MD    Family History    Family History  Problem Relation Age of Onset   Heart disease Father 62       MI x 2   Stroke Father    Hypertension Brother    Diabetes Brother    Hypertension Brother    Diabetes Brother    Breast cancer Sister    Colon cancer Neg Hx    He indicated that his mother is deceased. He indicated that his father is deceased. He indicated that five of his six sisters are alive. He indicated that all of his six brothers are alive. He indicated that the status of his neg hx is unknown.  Social History    Social History   Socioeconomic History   Marital status: Married    Spouse name: Not on file   Number of children: 3   Years of education: Not on file   Highest education level: Not on file  Occupational History   Occupation: Agricultural consultant    Comment: Secondary school teacher  Tobacco Use  Smoking status: Never   Smokeless tobacco: Current    Types: Snuff   Tobacco comments:    since 1980   Substance and Sexual Activity   Alcohol use: No   Drug use: No   Sexual activity: Not on file  Other Topics Concern   Not on file  Social History Narrative   Has 6 brothers, 6 sisters - 2 Brothers with with DM & HTN       Social Determinants of Health   Financial Resource Strain: Not on file  Food Insecurity: Not on file  Transportation Needs: Not on file  Physical Activity: Not on file  Stress: Not on file  Social Connections: Not on file  Intimate Partner Violence: Not on file     Review of Systems    General:  No chills, fever, night sweats or weight changes.  Cardiovascular:  No chest pain, dyspnea on exertion, edema, orthopnea, palpitations, paroxysmal nocturnal dyspnea. Dermatological: No rash, lesions/masses Respiratory: No cough, dyspnea Urologic: No hematuria, dysuria Abdominal:   No nausea, vomiting, diarrhea, bright red blood per rectum, melena, or hematemesis Neurologic:  No visual changes, wkns, changes in  mental status. All other systems reviewed and are otherwise negative except as noted above.  Physical Exam    VS:  There were no vitals taken for this visit. , BMI There is no height or weight on file to calculate BMI. GEN: Well nourished, well developed, in no acute distress. HEENT: normal. Neck: Supple, no JVD, carotid bruits, or masses. Cardiac: RRR, no murmurs, rubs, or gallops. No clubbing, cyanosis, edema.  Radials/DP/PT 2+ and equal bilaterally.  Respiratory:  Respirations regular and unlabored, clear to auscultation bilaterally. GI: Soft, nontender, nondistended, BS + x 4. MS: no deformity or atrophy. Skin: warm and dry, no rash. Neuro:  Strength and sensation are intact. Psych: Normal affect.  Accessory Clinical Findings    Recent Labs: 07/24/2022: ALT 15; BUN 9; Creatinine, Ser 0.83; Hemoglobin 14.5; Platelets 318; Potassium 4.9; Sodium 133   Recent Lipid Panel    Component Value Date/Time   CHOL 146 07/06/2021 1417   TRIG 193 (H) 07/06/2021 1417   HDL 48 07/06/2021 1417   CHOLHDL 3.0 07/06/2021 1417   CHOLHDL 5.0 04/01/2019 0318   VLDL 60 (H) 04/01/2019 0318   LDLCALC 66 07/06/2021 1417    No BP recorded.  {Refresh Note OR Click here to enter BP  :1}***    ECG personally reviewed by me today- *** - No acute changes  Echocardiogram 08/09/2022 IMPRESSIONS     1. Left ventricular ejection fraction, by estimation, is 50 to 55%. The  left ventricle has low normal function. The left ventricle has no regional  wall motion abnormalities. Left ventricular diastolic parameters are  consistent with Grade I diastolic  dysfunction (impaired relaxation).   2. Right ventricular systolic function is normal. The right ventricular  size is normal. Tricuspid regurgitation signal is inadequate for assessing  PA pressure.   3. The mitral valve is grossly normal. Mild mitral valve regurgitation.   4. The aortic valve was not well visualized. There is moderate  calcification  of the aortic valve. There is moderate thickening of the  aortic valve. Aortic valve regurgitation is mild. Aortic valve  sclerosis/calcification is present, without any evidence   of aortic stenosis.   5. Aortic dilatation noted. There is borderline dilatation of the aortic  root, measuring 36 mm.   6. The inferior vena cava is normal in size with greater than 50%  respiratory variability, suggesting right atrial pressure of 3 mmHg.   Comparison(s): Prior images reviewed side by side. Compared to prior TTE  on 03/2019, the EF appears stable and the AI appears mild (previously  reported as mild-to-moderate).   FINDINGS   Left Ventricle: Left ventricular ejection fraction, by estimation, is 50  to 55%. The left ventricle has low normal function. The left ventricle has  no regional wall motion abnormalities. The left ventricular internal  cavity size was normal in size.  There is no left ventricular hypertrophy. Left ventricular diastolic  parameters are consistent with Grade I diastolic dysfunction (impaired  relaxation).   Right Ventricle: The right ventricular size is normal. No increase in  right ventricular wall thickness. Right ventricular systolic function is  normal. Tricuspid regurgitation signal is inadequate for assessing PA  pressure.   Left Atrium: Left atrial size was normal in size.   Right Atrium: Right atrial size was normal in size.   Pericardium: There is no evidence of pericardial effusion.   Mitral Valve: The mitral valve is grossly normal. There is mild thickening  of the mitral valve leaflet(s). There is mild calcification of the mitral  valve leaflet(s). Mild to moderate mitral annular calcification. Mild  mitral valve regurgitation.   Tricuspid Valve: The tricuspid valve is normal in structure. Tricuspid  valve regurgitation is not demonstrated.   Aortic Valve: The aortic valve was not well visualized. There is moderate  calcification of the aortic  valve. There is moderate thickening of the  aortic valve. Aortic valve regurgitation is mild. Aortic valve  sclerosis/calcification is present, without  any evidence of aortic stenosis.   Pulmonic Valve: The pulmonic valve was not well visualized. Pulmonic valve  regurgitation is trivial.   Aorta: Aortic dilatation noted. There is borderline dilatation of the  aortic root, measuring 36 mm.   Venous: The inferior vena cava is normal in size with greater than 50%  respiratory variability, suggesting right atrial pressure of 3 mmHg.   IAS/Shunts: The atrial septum is grossly normal.      Nuclear stress test 08/09/2022     Findings are consistent with prior myocardial infarction. The study is intermediate risk.   No ST deviation was noted.   LV perfusion is abnormal. Defect 1: There is a medium defect with mild reduction in uptake present in the apical to mid inferior and inferolateral location(s) that is partially reversible. There is abnormal wall motion in the defect area. Consistent with infarction.   Left ventricular function is abnormal. Global function is moderately reduced. Nuclear stress EF: 36 %. The left ventricular ejection fraction is moderately decreased (30-44%). End diastolic cavity size is mildly enlarged. End systolic cavity size is mildly enlarged.   Prior study available for comparison from 10/25/2016. LVEF 44%, fixed basal to mid inferior perfusion defect   Moderate size and mild intensity mostly fixed (SDS 1) apical, inferoapical and apical lateral perfusion defect, suggestive of possible scar or less likely artifact. LVEF 36% with global hypokinesis and apical/apical lateral akinesis. This is an intermediate risk study. Compared to a prior study in 2017, the LVEF is lower and the perfusion defect does not match with the prior study.  Assessment & Plan   1.  ***   Jossie Ng. Marty Sadlowski NP-C     01/28/2023, 4:18 PM [facility] [address]  [address] Office [phone] Fax [phone]    I spent***minutes examining this patient, reviewing medications, and using patient centered shared decision  making involving her cardiac care.  Prior to her visit I spent greater than 20 minutes reviewing her past medical history,  medications, and prior cardiac tests.

## 2023-01-30 ENCOUNTER — Ambulatory Visit: Payer: BLUE CROSS/BLUE SHIELD | Admitting: General Practice

## 2023-03-05 NOTE — Progress Notes (Deleted)
Cardiology Clinic Note   Patient Name: Wesley Davila Date of Encounter: 03/05/2023  Primary Care Provider:  Georgina Quint, MD Primary Cardiologist:  Bryan Lemma, MD  Patient Profile    Wesley Davila 62 year old male presents to the clinic today for follow-up evaluation of his coronary artery disease and hyperlipidemia.   Past Medical History    Past Medical History:  Diagnosis Date   2v CAD S/P DES PCI LCx, med Rx dLAD 60%. 04/05/2019   04/01/2019: Distal LAD 60%.  Proximal mid LCx 90%-(DES PCI Resolute Onyx 3.5 x 12).  EF 55 to 60%.   Diabetes mellitus type 2 with complications (HCC)    CAD.   History of NSTEMI 04/01/2019   EF 55-60%. PCI LCx.   Hyperlipidemia    patient-activated cardiac event monitor 10/2020   Essentially normal.  No arrhythmias.  Rare PACs and PVCs.  15 patient triggered events.  Mostly associated with sinus rhythm-sinus tachycardia and PVCs.   Past Surgical History:  Procedure Laterality Date   CORONARY STENT INTERVENTION N/A 04/01/2019   Procedure: CORONARY STENT INTERVENTION;  Surgeon: Swaziland, Peter M, MD;  Location: [facility] INVASIVE CV LAB;  Service: Cardiovascular;  p-mLCx 90% -> 0%: Resolute Onyx DES 3.5 x 12.   LEFT HEART CATH AND CORONARY ANGIOGRAPHY N/A 04/01/2019   Procedure: LEFT HEART CATH AND CORONARY ANGIOGRAPHY;  Surgeon: Swaziland, Peter M, MD;  Location: [facility] INVASIVE CV LAB;  Service: Cardiovascular; NSTEMI: Distal LAD 60%.  Proximal mid LCx 90%-(DES PCI.  EF 55 to 60%.   NM MYOVIEW LTD  09/2016   Fixed small, mild basal-mid inferior perfusion defect likely diaphragmatic attenuation. No evidence of ischemia. EF 44% with diffuse hypokinesis per computer read, but visually it would appear to be better. Considered intermediate risk.   TRANSTHORACIC ECHOCARDIOGRAM Right 04/28/2019   EF 55 to 60%.  Impaired relaxation.  No R WMA.  Mild aortic valve sclerosis with no stenosis.  Normal RV.    Allergies  No Known Allergies  History of  Present Illness    Wesley Davila has a PMH of coronary artery disease, type 2 diabetes, hyperlipidemia.  He was initially seen by cardiology 12/17 due to chest discomfort.  He underwent nuclear stress testing which showed an EF of 55% and small fixed mild basal-mid inferior perfusion defect with possible diaphragmatic attenuation.  There is no evidence of ischemia.  Subsequently he had echocardiogram 2/18 which showed an EF of 55-60%, no significant valvular abnormalities.  He was admitted with NSTEMI 6/20.  Echocardiogram 04/01/2019 showed an EF of 55-60% and no significant wall motion abnormality, mild-moderate AI.  Cardiac catheterization 04/01/2019 showed single-vessel CAD, 60% distal LAD, 90% proximal-mid left circumflex lesion treated with PCI and DES.  There was no significant disease in his circumflex or RCA.  His EF was noted to be 55-65%.  His rosuvastatin was increased to 40 mg daily.  His LDL was noted to be 156 during his hospitalization with a total cholesterol of 272 and triglycerides of 201.     He was seen by Azalee Course PA-C 12/21 for evaluation of presyncope.  A cardiac event monitor 1/22 showed normal sinus rhythm with rare PACs and PVCs.  His triggered events were associated with sinus rhythm.  He was seen by Dr. Herbie Baltimore 3/23 and doing well at that time.  He denied significant chest discomfort.   He was seen by Azalee Course PA-C on 08/20/2022.  During that time he had undergone repeat echocardiogram which showed an EF  of 50-55%, no regional wall motion abnormalities, Myoview showed medium defect of mild reduction in uptake present in the apical to mid inferior and inferior lateral wall that was noted to be reversible.  Abnormal wall motion was noted and defect area.  EF was 44%.  Medical management was discussed with the patient versus cardiac catheterization.  He wished to continue with medical management.  He reported that he would be taking a trip to [place] for 1 week in November.  He was  prescribed Imdur 30 mg daily.  Follow-up was planned for 6 to 8 weeks.   He presents to the clinic today for follow-up evaluation and states***   *** denies chest pain, shortness of breath, lower extremity edema, fatigue, palpitations, melena, hematuria, hemoptysis, diaphoresis, weakness, presyncope, syncope, orthopnea, and PND.   Coronary artery disease-no chest pain today.  Reviewed nuclear stress testing.  Details above.  Tolerating Imdur well. Continue medical therapy Heart healthy low-sodium diet Increase physical activity as tolerated   Hyperlipidemia-LDL*** Continue aspirin, rosuvastatin Heart healthy low-sodium high-fiber diet Increase physical activity as tolerated   Type 2 diabetes-glucose 258 on 07/24/2022. Heart healthy low-sodium carb modified diet Increase physical activity as tolerated Continue metformin Follows with PCP   Disposition: Follow-up with Dr. Ellyn Hack, [facility] PA-C, or me in 4 to 6 months.  Home Medications    Prior to Admission medications   Medication Sig Start Date End Date Taking? Authorizing Provider  aspirin 81 MG chewable tablet Chew 1 tablet (81 mg total) by mouth daily. 04/02/19   Georgette Shell, MD  glucose blood [facility] ULTRA) test strip USE ONCE DAILY AS DIRECTED 09/28/20   Horald Pollen, MD  isosorbide mononitrate (IMDUR) 30 MG 24 hr tablet Take 1 tablet (30 mg total) by mouth daily. 08/20/22   Almyra Deforest, PA  metFORMIN (GLUCOPHAGE) 1000 MG tablet Take 1 tablet (1,000 mg total) by mouth 2 (two) times daily with a meal. 01/04/22   Sagardia, Ines Bloomer, MD  metoprolol tartrate (LOPRESSOR) 50 MG tablet Take 1.5 tablets (75 mg total) by mouth 2 (two) times daily. 07/24/22   Almyra Deforest, PA  nitroGLYCERIN (NITROSTAT) 0.4 MG SL tablet Place 1 tablet (0.4 mg total) under the tongue every 5 (five) minutes as needed for chest pain. 07/24/22 10/22/22  Almyra Deforest, PA  rosuvastatin (CRESTOR) 40 MG tablet Take 1 tablet (40 mg total) by mouth daily.  01/08/22 07/24/22  Leonie Man, MD    Family History    Family History  Problem Relation Age of Onset   Heart disease Father 62       MI x 2   Stroke Father    Hypertension Brother    Diabetes Brother    Hypertension Brother    Diabetes Brother    Breast cancer Sister    Colon cancer Neg Hx    He indicated that his mother is deceased. He indicated that his father is deceased. He indicated that five of his six sisters are alive. He indicated that all of his six brothers are alive. He indicated that the status of his neg hx is unknown.  Social History    Social History   Socioeconomic History   Marital status: Married    Spouse name: Not on file   Number of children: 3   Years of education: Not on file   Highest education level: Not on file  Occupational History   Occupation: Agricultural consultant    Comment: Secondary school teacher  Tobacco Use  Smoking status: Never   Smokeless tobacco: Current    Types: Snuff   Tobacco comments:    since 1980   Substance and Sexual Activity   Alcohol use: No   Drug use: No   Sexual activity: Not on file  Other Topics Concern   Not on file  Social History Narrative   Has 6 brothers, 6 sisters - 2 Brothers with with DM & HTN       Social Determinants of Health   Financial Resource Strain: Not on file  Food Insecurity: Not on file  Transportation Needs: Not on file  Physical Activity: Not on file  Stress: Not on file  Social Connections: Not on file  Intimate Partner Violence: Not on file     Review of Systems    General:  No chills, fever, night sweats or weight changes.  Cardiovascular:  No chest pain, dyspnea on exertion, edema, orthopnea, palpitations, paroxysmal nocturnal dyspnea. Dermatological: No rash, lesions/masses Respiratory: No cough, dyspnea Urologic: No hematuria, dysuria Abdominal:   No nausea, vomiting, diarrhea, bright red blood per rectum, melena, or hematemesis Neurologic:  No visual changes, wkns, changes in  mental status. All other systems reviewed and are otherwise negative except as noted above.  Physical Exam    VS:  There were no vitals taken for this visit. , BMI There is no height or weight on file to calculate BMI. GEN: Well nourished, well developed, in no acute distress. HEENT: normal. Neck: Supple, no JVD, carotid bruits, or masses. Cardiac: RRR, no murmurs, rubs, or gallops. No clubbing, cyanosis, edema.  Radials/DP/PT 2+ and equal bilaterally.  Respiratory:  Respirations regular and unlabored, clear to auscultation bilaterally. GI: Soft, nontender, nondistended, BS + x 4. MS: no deformity or atrophy. Skin: warm and dry, no rash. Neuro:  Strength and sensation are intact. Psych: Normal affect.  Accessory Clinical Findings    Recent Labs: 07/24/2022: ALT 15; BUN 9; Creatinine, Ser 0.83; Hemoglobin 14.5; Platelets 318; Potassium 4.9; Sodium 133   Recent Lipid Panel    Component Value Date/Time   CHOL 146 07/06/2021 1417   TRIG 193 (H) 07/06/2021 1417   HDL 48 07/06/2021 1417   CHOLHDL 3.0 07/06/2021 1417   CHOLHDL 5.0 04/01/2019 0318   VLDL 60 (H) 04/01/2019 0318   LDLCALC 66 07/06/2021 1417    No BP recorded.  {Refresh Note OR Click here to enter BP  :1}***    ECG personally reviewed by me today- *** - No acute changes  Echocardiogram 08/09/2022 IMPRESSIONS     1. Left ventricular ejection fraction, by estimation, is 50 to 55%. The  left ventricle has low normal function. The left ventricle has no regional  wall motion abnormalities. Left ventricular diastolic parameters are  consistent with Grade I diastolic  dysfunction (impaired relaxation).   2. Right ventricular systolic function is normal. The right ventricular  size is normal. Tricuspid regurgitation signal is inadequate for assessing  PA pressure.   3. The mitral valve is grossly normal. Mild mitral valve regurgitation.   4. The aortic valve was not well visualized. There is moderate  calcification  of the aortic valve. There is moderate thickening of the  aortic valve. Aortic valve regurgitation is mild. Aortic valve  sclerosis/calcification is present, without any evidence   of aortic stenosis.   5. Aortic dilatation noted. There is borderline dilatation of the aortic  root, measuring 36 mm.   6. The inferior vena cava is normal in size with greater than 50%  respiratory variability, suggesting right atrial pressure of 3 mmHg.   Comparison(s): Prior images reviewed side by side. Compared to prior TTE  on 03/2019, the EF appears stable and the AI appears mild (previously  reported as mild-to-moderate).   FINDINGS   Left Ventricle: Left ventricular ejection fraction, by estimation, is 50  to 55%. The left ventricle has low normal function. The left ventricle has  no regional wall motion abnormalities. The left ventricular internal  cavity size was normal in size.  There is no left ventricular hypertrophy. Left ventricular diastolic  parameters are consistent with Grade I diastolic dysfunction (impaired  relaxation).   Right Ventricle: The right ventricular size is normal. No increase in  right ventricular wall thickness. Right ventricular systolic function is  normal. Tricuspid regurgitation signal is inadequate for assessing PA  pressure.   Left Atrium: Left atrial size was normal in size.   Right Atrium: Right atrial size was normal in size.   Pericardium: There is no evidence of pericardial effusion.   Mitral Valve: The mitral valve is grossly normal. There is mild thickening  of the mitral valve leaflet(s). There is mild calcification of the mitral  valve leaflet(s). Mild to moderate mitral annular calcification. Mild  mitral valve regurgitation.   Tricuspid Valve: The tricuspid valve is normal in structure. Tricuspid  valve regurgitation is not demonstrated.   Aortic Valve: The aortic valve was not well visualized. There is moderate  calcification of the aortic  valve. There is moderate thickening of the  aortic valve. Aortic valve regurgitation is mild. Aortic valve  sclerosis/calcification is present, without  any evidence of aortic stenosis.   Pulmonic Valve: The pulmonic valve was not well visualized. Pulmonic valve  regurgitation is trivial.   Aorta: Aortic dilatation noted. There is borderline dilatation of the  aortic root, measuring 36 mm.   Venous: The inferior vena cava is normal in size with greater than 50%  respiratory variability, suggesting right atrial pressure of 3 mmHg.   IAS/Shunts: The atrial septum is grossly normal.      Nuclear stress test 08/09/2022     Findings are consistent with prior myocardial infarction. The study is intermediate risk.   No ST deviation was noted.   LV perfusion is abnormal. Defect 1: There is a medium defect with mild reduction in uptake present in the apical to mid inferior and inferolateral location(s) that is partially reversible. There is abnormal wall motion in the defect area. Consistent with infarction.   Left ventricular function is abnormal. Global function is moderately reduced. Nuclear stress EF: 36 %. The left ventricular ejection fraction is moderately decreased (30-44%). End diastolic cavity size is mildly enlarged. End systolic cavity size is mildly enlarged.   Prior study available for comparison from 10/25/2016. LVEF 44%, fixed basal to mid inferior perfusion defect   Moderate size and mild intensity mostly fixed (SDS 1) apical, inferoapical and apical lateral perfusion defect, suggestive of possible scar or less likely artifact. LVEF 36% with global hypokinesis and apical/apical lateral akinesis. This is an intermediate risk study. Compared to a prior study in 2017, the LVEF is lower and the perfusion defect does not match with the prior study.  Assessment & Plan   1.  ***   Thomasene Ripple. Gurinder Toral NP-C     03/05/2023, 1:05 PM [facility] [address]  [address] Office [phone] Fax [phone]    I spent***minutes examining this patient, reviewing medications, and using patient centered shared decision  making involving her cardiac care.  Prior to her visit I spent greater than 20 minutes reviewing her past medical history,  medications, and prior cardiac tests.

## 2023-03-07 ENCOUNTER — Ambulatory Visit: Payer: BLUE CROSS/BLUE SHIELD | Admitting: General Practice

## 2023-04-11 ENCOUNTER — Other Ambulatory Visit: Payer: Self-pay | Admitting: Physician Assistant

## 2023-04-11 ENCOUNTER — Other Ambulatory Visit: Payer: Self-pay | Admitting: Cardiology

## 2023-04-11 DIAGNOSIS — E1169 Type 2 diabetes mellitus with other specified complication: Secondary | ICD-10-CM

## 2023-04-16 NOTE — Progress Notes (Signed)
Cardiology Clinic Note   Date: 04/17/2023 ID: Eldean Gean, DOB December 26, 1960, MRN [id number]  Primary Cardiologist:  Bryan Lemma, MD  Patient Profile    Fenix Wilcken is a 62 y.o. male who presents to the clinic today for routine follow up.     Past medical history significant for: CAD. LHC 04/02/2019 (NSTEMI): Distal LAD 60%.  Proximal to mid LCx 90%.  PCI with DES to mid LCx. Echo 08/09/2022: EF 50 to 55%.  Grade I DD.  Normal RV function.  Mild MR.  Aortic valve sclerosis/calcification without stenosis.  Mild AI.  Borderline dilatation of aortic root 36 mm. Nuclear stress test 08/09/2022: Moderate sized mild intensity mostly fixed apical, inferior apical and apical lateral perfusion defect suggestive of possible scar or less likely artifact.  Abnormal wall motion and defect area.  LVEF 36% with global hypokinesis and apical/apical lateral akinesis.  This is an intermittent risk study.  Compared to prior study in 2017 LVEF is lower and the perfusion defect does not match with the prior study. Hyperlipidemia. Lipid panel 01/17/2023: LDL 134, HDL 59, TG 213, total 242. T2DM. Syncope. 14-day ZIO 11/18/2020: Essentially normal monitor.  No signs of arrhythmia.  Rare PVCs/PACs with some couplets and triplets.  15 patient triggered events mostly associated with sinus rhythm and low weight sinus tachycardia, associated with PVCs.     History of Present Illness    Sacha Beserra was first evaluated by Dr. Herbie Baltimore on 10/17/2016 for intermittent chest pain at the request of Dr. Creta Levin.  Nuclear stress test showed fixed small, mild basal to mid inferior perfusion defect possible diaphragmatic attenuation.  EF 44%.  Follow-up echo February 2018 showed normal LV function.  Patient continues to be followed by Dr. Herbie Baltimore for the above outlined history.  Patient was last seen in the office by [place] on 08/20/2022 for follow-up of stress test (outlined above).  With shared decision making, it was  agreed to pursue medical management versus cardiac catheterization.  Patient was started on Imdur and provided with ED precautions.  Today, patient is here alone. He denies shortness of breath or dyspnea on exertion. No chest pain, pressure, or tightness. Denies lower extremity edema, orthopnea, or PND. No palpitations. He denies lightheadedness, dizziness, presyncope or syncope. He reports feeling weak. Upon further discussion, what he is describing sounds more like fatigue. This is not new for the last 2-3 years. 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 He does not sleep well and gets <6 hours a night. He walks 1 mile at work on his lunch break three days a week.   His lipids were checked by PCP in March.  He provides the results to me from his phone.    ROS: All other systems reviewed and are otherwise negative except as noted in History of Present Illness.  Studies Reviewed       ECG is not ordered today.          Physical Exam    VS:  BP 126/82   Pulse 84   Ht 5\' 9"  (1.753 m)   Wt 188 lb 9.6 oz (85.5 kg)   SpO2 97%   BMI 27.85 kg/m  , BMI Body mass index is 27.85 kg/m.  GEN: Well nourished, well developed, in no acute distress. Neck: No JVD or carotid bruits. Cardiac:  RRR. No murmurs. No rubs or gallops.   Respiratory:  Respirations regular and unlabored. Clear to  auscultation without rales, wheezing or rhonchi. GI: Soft, nontender, nondistended. Extremities: Radials/DP/PT 2+ and equal bilaterally. No clubbing or cyanosis. No edema.  Skin: Warm and dry, no rash. Neuro: Strength intact.  Assessment & Plan    CAD.  S/p PCI with DES to mid LCx June 2020.  Nuclear stress test October 2023 showed fixed perfusion defect suggestive of possible scar with abnormal wall motion in the defect area.  Patient preferred medical management versus cardiac catheterization at that time.  Today patient denies chest pain, tightness,  pressure.  He walks 1 mile 3 times a week on his lunch break at work.  No chest pain or dyspnea with his walks.  Continue aspirin, isosorbide, metoprolol, rosuvastatin, as needed SL NTG. Hyperlipidemia.  LDL March 2024 134, not at goal.  He reports that he has been taking his rosuvastatin.  Will add Zetia 10 mg.  Continue rosuvastatin.  Return in 8 to 10 weeks for fasting lipids and LFTs.  Disposition: Zetia 10 mg daily.  Fasting lipid panel and LFTs in 8 to 10 weeks.  Return in 6 months or sooner as needed.         Signed, Etta Grandchild. [facility], DNP, NP-C

## 2023-04-17 ENCOUNTER — Encounter: Payer: Self-pay | Admitting: Student

## 2023-04-17 ENCOUNTER — Ambulatory Visit: Payer: BLUE CROSS/BLUE SHIELD | Attending: Physician Assistant | Admitting: Student

## 2023-04-17 VITALS — BP 126/82 | HR 84 | Ht 69.0 in | Wt 188.6 lb

## 2023-04-17 DIAGNOSIS — E785 Hyperlipidemia, unspecified: Secondary | ICD-10-CM

## 2023-04-17 DIAGNOSIS — I2510 Atherosclerotic heart disease of native coronary artery without angina pectoris: Secondary | ICD-10-CM | POA: Diagnosis not present

## 2023-04-17 DIAGNOSIS — Z79899 Other long term (current) drug therapy: Secondary | ICD-10-CM

## 2023-04-17 MED ORDER — EZETIMIBE 10 MG PO TABS: 10.0000 mg | ORAL_TABLET | Freq: Every day | ORAL | 3 refills | Status: AC

## 2023-04-17 NOTE — Patient Instructions (Signed)
Medication Instructions:  Ezetimibe 10 mg daily *If you need a refill on your cardiac medications before your next appointment, please call your pharmacy*   Lab Work: Fasting Lipid panel and Liver panel- Please return for Blood Work in 8-10 weeks. No appointment needed, lab here at the office is open Monday-Friday from 8AM to 4PM.   If you have labs (blood work) drawn today and your tests are completely normal, you will receive your results only by: MyChart Message (if you have MyChart) OR A paper copy in the mail If you have any lab test that is abnormal or we need to change your treatment, we will call you to review the results.  Follow-Up: At [facility], you and your health needs are our priority.  As part of our continuing mission to provide you with exceptional heart care, we have created designated Provider Care Teams.  These Care Teams include your primary Cardiologist (physician) and Advanced Practice Providers (APPs -  Physician Assistants and Nurse Practitioners) who all work together to provide you with the care you need, when you need it.  We recommend signing up for the patient portal called "MyChart".  Sign up information is provided on this After Visit Summary.  MyChart is used to connect with patients for Virtual Visits (Telemedicine).  Patients are able to view lab/test results, encounter notes, upcoming appointments, etc.  Non-urgent messages can be sent to your provider as well.   To learn more about what you can do with MyChart, go to [email or web address].    Your next appointment:   6 month(s)  Provider:   Bryan Lemma, MD

## 2023-09-14 ENCOUNTER — Other Ambulatory Visit: Payer: Self-pay | Admitting: Emergency Medicine

## 2023-09-14 DIAGNOSIS — E1165 Type 2 diabetes mellitus with hyperglycemia: Secondary | ICD-10-CM

## 2023-10-01 ENCOUNTER — Other Ambulatory Visit: Payer: Self-pay | Admitting: Physician Assistant

## 2023-10-01 DIAGNOSIS — R079 Chest pain, unspecified: Secondary | ICD-10-CM

## 2023-10-03 ENCOUNTER — Other Ambulatory Visit: Payer: Self-pay | Admitting: Physician Assistant

## 2023-10-03 DIAGNOSIS — R079 Chest pain, unspecified: Secondary | ICD-10-CM

## 2023-11-02 ENCOUNTER — Other Ambulatory Visit: Payer: Self-pay | Admitting: Physician Assistant

## 2023-11-02 DIAGNOSIS — E1169 Type 2 diabetes mellitus with other specified complication: Secondary | ICD-10-CM

## 2023-11-02 DIAGNOSIS — E785 Hyperlipidemia, unspecified: Secondary | ICD-10-CM

## 2023-12-30 ENCOUNTER — Other Ambulatory Visit: Payer: Self-pay | Admitting: Physician Assistant

## 2023-12-30 DIAGNOSIS — R079 Chest pain, unspecified: Secondary | ICD-10-CM

## 2024-06-02 ENCOUNTER — Encounter: Admitting: Emergency Medicine
